# Patient Record
Sex: Female | Born: 1945 | Race: White | Hispanic: No | Smoking: Never smoker
Health system: Southern US, Community
[De-identification: ages and names within clinical notes are randomized; demographics above are authoritative.]

## PROBLEM LIST (undated history)

## (undated) DIAGNOSIS — E11319 Type 2 diabetes mellitus with unspecified diabetic retinopathy without macular edema: Secondary | ICD-10-CM

## (undated) DIAGNOSIS — E119 Type 2 diabetes mellitus without complications: Secondary | ICD-10-CM

## (undated) DIAGNOSIS — M1990 Unspecified osteoarthritis, unspecified site: Secondary | ICD-10-CM

## (undated) DIAGNOSIS — E785 Hyperlipidemia, unspecified: Secondary | ICD-10-CM

## (undated) DIAGNOSIS — E1042 Type 1 diabetes mellitus with diabetic polyneuropathy: Secondary | ICD-10-CM

## (undated) DIAGNOSIS — E669 Obesity, unspecified: Secondary | ICD-10-CM

## (undated) DIAGNOSIS — Z8619 Personal history of other infectious and parasitic diseases: Secondary | ICD-10-CM

## (undated) DIAGNOSIS — I10 Essential (primary) hypertension: Secondary | ICD-10-CM

## (undated) DIAGNOSIS — E039 Hypothyroidism, unspecified: Secondary | ICD-10-CM

## (undated) DIAGNOSIS — F32A Depression, unspecified: Secondary | ICD-10-CM

## (undated) DIAGNOSIS — F329 Major depressive disorder, single episode, unspecified: Secondary | ICD-10-CM

## (undated) DIAGNOSIS — G5603 Carpal tunnel syndrome, bilateral upper limbs: Secondary | ICD-10-CM

## (undated) DIAGNOSIS — N189 Chronic kidney disease, unspecified: Secondary | ICD-10-CM

## (undated) HISTORY — PX: EYE SURGERY: SHX253

---

## 2006-05-24 ENCOUNTER — Ambulatory Visit: Payer: Self-pay | Admitting: Family Medicine

## 2006-05-25 ENCOUNTER — Ambulatory Visit: Payer: Self-pay | Admitting: Family Medicine

## 2006-06-24 ENCOUNTER — Ambulatory Visit: Payer: Self-pay | Admitting: Family Medicine

## 2006-07-25 ENCOUNTER — Ambulatory Visit: Payer: Self-pay | Admitting: Family Medicine

## 2010-02-15 ENCOUNTER — Ambulatory Visit: Payer: Self-pay | Admitting: Family Medicine

## 2010-10-27 ENCOUNTER — Ambulatory Visit: Payer: Self-pay | Admitting: Ophthalmology

## 2010-11-09 ENCOUNTER — Ambulatory Visit: Payer: Self-pay | Admitting: Ophthalmology

## 2010-12-27 ENCOUNTER — Ambulatory Visit: Payer: Self-pay | Admitting: Ophthalmology

## 2011-01-04 ENCOUNTER — Ambulatory Visit: Payer: Self-pay | Admitting: Ophthalmology

## 2011-03-10 ENCOUNTER — Ambulatory Visit: Payer: Self-pay | Admitting: Family Medicine

## 2011-03-10 LAB — HM DEXA SCAN

## 2012-07-11 ENCOUNTER — Emergency Department: Payer: Self-pay | Admitting: Emergency Medicine

## 2012-07-11 LAB — COMPREHENSIVE METABOLIC PANEL
Albumin: 3.4 g/dL (ref 3.4–5.0)
Anion Gap: 6 — ABNORMAL LOW (ref 7–16)
BUN: 42 mg/dL — ABNORMAL HIGH (ref 7–18)
Bilirubin,Total: 0.3 mg/dL (ref 0.2–1.0)
Chloride: 114 mmol/L — ABNORMAL HIGH (ref 98–107)
Creatinine: 1.19 mg/dL (ref 0.60–1.30)
EGFR (Non-African Amer.): 48 — ABNORMAL LOW
Glucose: 75 mg/dL (ref 65–99)
Potassium: 4.4 mmol/L (ref 3.5–5.1)
SGOT(AST): 27 U/L (ref 15–37)
Sodium: 139 mmol/L (ref 136–145)
Total Protein: 7.7 g/dL (ref 6.4–8.2)

## 2012-07-11 LAB — TROPONIN I: Troponin-I: 0.03 ng/mL

## 2012-07-11 LAB — URINALYSIS, COMPLETE
Glucose,UR: 50 mg/dL (ref 0–75)
Ph: 5 (ref 4.5–8.0)
Protein: NEGATIVE
RBC,UR: 1 /HPF (ref 0–5)
Specific Gravity: 1.017 (ref 1.003–1.030)
WBC UR: 15 /HPF (ref 0–5)

## 2012-07-11 LAB — CBC
HCT: 42.3 % (ref 35.0–47.0)
HGB: 13.3 g/dL (ref 12.0–16.0)
MCH: 29.1 pg (ref 26.0–34.0)
MCHC: 31.6 g/dL — ABNORMAL LOW (ref 32.0–36.0)
MCV: 92 fL (ref 80–100)
Platelet: 189 10*3/uL (ref 150–440)
RBC: 4.58 10*6/uL (ref 3.80–5.20)

## 2012-09-18 ENCOUNTER — Ambulatory Visit: Payer: Self-pay | Admitting: Family Medicine

## 2013-01-31 ENCOUNTER — Ambulatory Visit: Payer: Self-pay | Admitting: Neurology

## 2013-07-25 HISTORY — PX: CATARACT EXTRACTION: SUR2

## 2014-01-02 ENCOUNTER — Ambulatory Visit: Payer: Self-pay | Admitting: Family Medicine

## 2014-01-03 ENCOUNTER — Ambulatory Visit: Payer: Self-pay | Admitting: Family Medicine

## 2014-01-10 ENCOUNTER — Ambulatory Visit: Payer: Self-pay | Admitting: Unknown Physician Specialty

## 2014-08-06 ENCOUNTER — Ambulatory Visit: Payer: Self-pay | Admitting: Family Medicine

## 2014-09-30 DIAGNOSIS — E1122 Type 2 diabetes mellitus with diabetic chronic kidney disease: Secondary | ICD-10-CM | POA: Insufficient documentation

## 2014-09-30 DIAGNOSIS — E1065 Type 1 diabetes mellitus with hyperglycemia: Secondary | ICD-10-CM | POA: Insufficient documentation

## 2014-10-22 LAB — HEPATIC FUNCTION PANEL
ALK PHOS: 83 U/L (ref 25–125)
ALT: 14 U/L (ref 7–35)
AST: 16 U/L (ref 13–35)

## 2014-10-22 LAB — LIPID PANEL
CHOLESTEROL: 183 mg/dL (ref 0–200)
HDL: 83 mg/dL — AB (ref 35–70)
LDL CALC: 80 mg/dL
LDl/HDL Ratio: 1
Triglycerides: 98 mg/dL (ref 40–160)

## 2014-10-22 LAB — BASIC METABOLIC PANEL
BUN: 44 mg/dL — AB (ref 4–21)
Creatinine: 1.5 mg/dL — AB (ref 0.5–1.1)
GLUCOSE: 132 mg/dL
Potassium: 4.9 mmol/L (ref 3.4–5.3)
SODIUM: 139 mmol/L (ref 137–147)

## 2014-10-22 LAB — CBC AND DIFFERENTIAL
HEMATOCRIT: 41 % (ref 36–46)
Hemoglobin: 13.3 g/dL (ref 12.0–16.0)
NEUTROS ABS: 7 /uL
PLATELETS: 206 10*3/uL (ref 150–399)
WBC: 10 10*3/mL

## 2014-10-22 LAB — TSH: TSH: 2.3 u[IU]/mL (ref 0.41–5.90)

## 2015-01-19 ENCOUNTER — Other Ambulatory Visit: Payer: Self-pay | Admitting: Family Medicine

## 2015-03-28 ENCOUNTER — Other Ambulatory Visit: Payer: Self-pay | Admitting: Family Medicine

## 2015-04-07 DIAGNOSIS — E039 Hypothyroidism, unspecified: Secondary | ICD-10-CM | POA: Insufficient documentation

## 2015-04-07 DIAGNOSIS — E11610 Type 2 diabetes mellitus with diabetic neuropathic arthropathy: Secondary | ICD-10-CM

## 2015-04-07 DIAGNOSIS — G35 Multiple sclerosis: Secondary | ICD-10-CM | POA: Insufficient documentation

## 2015-04-07 DIAGNOSIS — E669 Obesity, unspecified: Secondary | ICD-10-CM | POA: Insufficient documentation

## 2015-04-07 DIAGNOSIS — I10 Essential (primary) hypertension: Secondary | ICD-10-CM | POA: Insufficient documentation

## 2015-04-07 DIAGNOSIS — F329 Major depressive disorder, single episode, unspecified: Secondary | ICD-10-CM | POA: Insufficient documentation

## 2015-04-07 DIAGNOSIS — E785 Hyperlipidemia, unspecified: Secondary | ICD-10-CM | POA: Insufficient documentation

## 2015-04-07 DIAGNOSIS — F40240 Claustrophobia: Secondary | ICD-10-CM | POA: Insufficient documentation

## 2015-04-08 ENCOUNTER — Ambulatory Visit (INDEPENDENT_AMBULATORY_CARE_PROVIDER_SITE_OTHER): Payer: Medicare Other | Admitting: Family Medicine

## 2015-04-08 VITALS — BP 132/60 | HR 72 | Temp 98.0°F | Resp 16 | Wt 195.0 lb

## 2015-04-08 DIAGNOSIS — F328 Other depressive episodes: Secondary | ICD-10-CM

## 2015-04-08 DIAGNOSIS — E1065 Type 1 diabetes mellitus with hyperglycemia: Secondary | ICD-10-CM | POA: Diagnosis not present

## 2015-04-08 DIAGNOSIS — F3289 Other specified depressive episodes: Secondary | ICD-10-CM

## 2015-04-08 DIAGNOSIS — N183 Chronic kidney disease, stage 3 (moderate): Secondary | ICD-10-CM

## 2015-04-08 DIAGNOSIS — E039 Hypothyroidism, unspecified: Secondary | ICD-10-CM

## 2015-04-08 DIAGNOSIS — Z23 Encounter for immunization: Secondary | ICD-10-CM

## 2015-04-08 DIAGNOSIS — N1831 Chronic kidney disease, stage 3a: Secondary | ICD-10-CM

## 2015-04-08 DIAGNOSIS — N1832 Chronic kidney disease, stage 3b: Secondary | ICD-10-CM | POA: Insufficient documentation

## 2015-04-08 DIAGNOSIS — I10 Essential (primary) hypertension: Secondary | ICD-10-CM | POA: Diagnosis not present

## 2015-04-08 DIAGNOSIS — IMO0002 Reserved for concepts with insufficient information to code with codable children: Secondary | ICD-10-CM

## 2015-04-08 NOTE — Progress Notes (Signed)
Patient ID: Mary [name], female   DOB: 10/01/1945, 69 y.o.   MRN: [id number]    Subjective:  HPI   Hypertension, follow-up:  BP Readings from Last 3 Encounters:  04/08/15 132/60  10/06/14 132/64    She was last seen for hypertension 6 months ago.  BP at that visit was 132/64. Management since that visit includes none .She reports good compliance with treatment. She is not having side effects.  She is exercising. She is adherent to low salt diet.   Outside blood pressures are not being checked. She is experiencing none.  ------------------------------------------------------------------------    Lipid/Cholesterol, Follow-up:   Last seen for this 6 months ago.  Management since that visit includes none.  Last Lipid Panel:    Component Value Date/Time   CHOL 183 10/22/2014   TRIG 98 10/22/2014   HDL 83* 10/22/2014   LDLCALC 80 10/22/2014    She reports good compliance with treatment. She is not having side effects.   Wt Readings from Last 3 Encounters:  04/08/15 195 lb (88.451 kg)  10/06/14 194 lb (87.998 kg)    ------------------------------------------------------------------------  Pt reports that Dr. Janann Colonel decreased her Coreg to 12.5mg  in the AM and 12.5 in the PM because she was having a lot of fatigue. She also see Dr. Tedd Sias for her DM and she has newly diagnosed her with Type 1 Diabetes.  She reports that she was put through some test to see if she qualified for a pump and she did and the test came back that she is Type 1 Diabetic. Pt has also been diagnoses with Stage 3 chronic kidney disease since LOV. She does reports that she is doing ok emotionally with all the changes in her health.      Prior to Admission medications   Medication Sig Start Date End Date Taking? Authorizing Provider  aspirin 81 MG tablet Take 81 mg by mouth daily.   Yes Historical Provider, MD  atorvastatin (LIPITOR) 10 MG tablet Take 10 mg by mouth daily.   Yes Historical  Provider, MD  carvedilol (COREG) 25 MG tablet Take 25 mg by mouth 2 (two) times daily with a meal. Pt takes a 1/2 tab in the AM and 1.2 tab in the PM   Yes Historical Provider, MD  FLUoxetine (PROZAC) 20 MG capsule Take 20 mg by mouth daily.   Yes Historical Provider, MD  Insulin Glargine (LANTUS SOLOSTAR) 100 UNIT/ML Solostar Pen Inject 43 Units into the skin daily.   Yes Historical Provider, MD  insulin lispro (HUMALOG KWIKPEN) 100 UNIT/ML KiwkPen Inject into the skin.   Yes Historical Provider, MD  Insulin Pen Needle 31G X 8 MM MISC by Does not apply route.   Yes Historical Provider, MD  levothyroxine (SYNTHROID, LEVOTHROID) 150 MCG tablet TAKE 1 TABLET BY MOUTH EVERY DAY 01/19/15  Yes Richard Hulen Shouts., MD  quinapril (ACCUPRIL) 40 MG tablet Take 20 mg by mouth at bedtime.    Yes Historical Provider, MD  triamterene-hydrochlorothiazide (DYAZIDE) 37.5-25 MG per capsule TAKE 1 CAPSULE ONCE DAILY 03/31/15  Yes Maple Hudson., MD  Vitamin D, Ergocalciferol, (DRISDOL) 50000 UNITS CAPS capsule Take by mouth.   Yes Historical Provider, MD    Patient Active Problem List   Diagnosis Date Noted  . Chronic kidney disease (CKD) stage G3a/A1, moderately decreased glomerular filtration rate (GFR) between 45-59 mL/min/1.73 square meter and albuminuria creatinine ratio less than 30 mg/g 04/08/2015  . Hypothyroidism 04/07/2015  . Obesity 04/07/2015  .  Hyperlipidemia 04/07/2015  . Depression, major 04/07/2015  . Claustrophobia 04/07/2015  . Multiple sclerosis 04/07/2015  . Hypertension 04/07/2015  . Diabetes mellitus type 1, uncontrolled 09/30/2014    No past medical history on file.  Social History   Social History  . Marital Status: Single    Spouse Name: N/A  . Number of Children: N/A  . Years of Education: N/A   Occupational History  . Not on file.   Social History Main Topics  . Smoking status: Not on file  . Smokeless tobacco: Not on file  . Alcohol Use: Not on file  . Drug  Use: Not on file  . Sexual Activity: Not on file   Other Topics Concern  . Not on file   Social History Narrative  . No narrative on file    Allergies  Allergen Reactions  . Hydrocodone Itching    Review of Systems  Constitutional: Positive for malaise/fatigue.  HENT: Negative.   Eyes: Negative.   Respiratory: Negative.   Cardiovascular: Negative.   Gastrointestinal: Negative.   Genitourinary: Negative.   Musculoskeletal: Positive for back pain.  Skin: Negative.   Neurological: Negative.   Endo/Heme/Allergies: Negative.   Psychiatric/Behavioral: Negative.     Immunization History  Administered Date(s) Administered  . Pneumococcal Conjugate-13 06/03/2014  . Pneumococcal Polysaccharide-23 06/06/2012  . Tdap 12/06/2012  . Zoster 12/06/2012   Objective:  BP 132/60 mmHg  Pulse 72  Temp(Src) 98 F (36.7 C) (Oral)  Resp 16  Wt 195 lb (88.451 kg)  Physical Exam  Constitutional: She is oriented to person, place, and time and well-developed, well-nourished, and in no distress.  HENT:  Head: Normocephalic and atraumatic.  Right Ear: External ear normal.  Left Ear: External ear normal.  Nose: Nose normal.  Eyes: Conjunctivae are normal.  Neck: Neck supple.  Cardiovascular: Normal rate, regular rhythm and normal heart sounds.   Pulmonary/Chest: Effort normal and breath sounds normal.  Neurological: She is alert and oriented to person, place, and time.  Skin: Skin is warm and dry.  Psychiatric: Mood, memory, affect and judgment normal.    Lab Results  Component Value Date   WBC 10.0 10/22/2014   HGB 13.3 10/22/2014   HCT 41 10/22/2014   PLT 206 10/22/2014   GLUCOSE 75 07/11/2012   CHOL 183 10/22/2014   TRIG 98 10/22/2014   HDL 83* 10/22/2014   LDLCALC 80 10/22/2014   TSH 2.30 10/22/2014    CMP     Component Value Date/Time   NA 139 10/22/2014   NA 139 07/11/2012 1434   K 4.9 10/22/2014   K 4.4 07/11/2012 1434   CL 114* 07/11/2012 1434   CO2 19*  07/11/2012 1434   GLUCOSE 75 07/11/2012 1434   BUN 44* 10/22/2014   BUN 42* 07/11/2012 1434   CREATININE 1.5* 10/22/2014   CREATININE 1.19 07/11/2012 1434   CALCIUM 9.8 07/11/2012 1434   PROT 7.7 07/11/2012 1434   ALBUMIN 3.4 07/11/2012 1434   AST 16 10/22/2014   AST 27 07/11/2012 1434   ALT 14 10/22/2014   ALT 21 07/11/2012 1434   ALKPHOS 83 10/22/2014   ALKPHOS 113 07/11/2012 1434   BILITOT 0.3 07/11/2012 1434   GFRNONAA 48* 07/11/2012 1434   GFRAA 55* 07/11/2012 1434    Assessment and Plan :  1. Diabetes mellitus type 1, uncontrolled   2. Essential hypertension Check BP at home and bring in readings.  3. Hypothyroidism, unspecified hypothyroidism type   4. Other depressive episodes  5. Chronic kidney disease (CKD) stage G3a/A1, moderately decreased glomerular filtration rate (GFR) between 45-59 mL/min/1.73 square meter and albuminuria creatinine ratio less than 30 mg/g   6. Need for influenza vaccination  - Flu vaccine HIGH DOSE PF   Julieanne Manson MD [facility] 04/08/2015 1:56 PM

## 2015-04-26 ENCOUNTER — Other Ambulatory Visit: Payer: Self-pay | Admitting: Family Medicine

## 2015-06-11 ENCOUNTER — Other Ambulatory Visit: Payer: Self-pay | Admitting: Family Medicine

## 2015-06-27 ENCOUNTER — Other Ambulatory Visit: Payer: Self-pay | Admitting: Family Medicine

## 2015-07-26 HISTORY — PX: CYST EXCISION: SHX5701

## 2015-09-02 DIAGNOSIS — E113591 Type 2 diabetes mellitus with proliferative diabetic retinopathy without macular edema, right eye: Secondary | ICD-10-CM | POA: Diagnosis not present

## 2015-09-03 DIAGNOSIS — I129 Hypertensive chronic kidney disease with stage 1 through stage 4 chronic kidney disease, or unspecified chronic kidney disease: Secondary | ICD-10-CM | POA: Diagnosis not present

## 2015-09-03 DIAGNOSIS — E1122 Type 2 diabetes mellitus with diabetic chronic kidney disease: Secondary | ICD-10-CM | POA: Diagnosis not present

## 2015-09-03 DIAGNOSIS — R809 Proteinuria, unspecified: Secondary | ICD-10-CM | POA: Diagnosis not present

## 2015-09-03 DIAGNOSIS — N183 Chronic kidney disease, stage 3 (moderate): Secondary | ICD-10-CM | POA: Diagnosis not present

## 2015-09-09 ENCOUNTER — Other Ambulatory Visit: Payer: Self-pay

## 2015-09-09 MED ORDER — INSULIN GLARGINE 100 UNIT/ML SOLOSTAR PEN
43.0000 [IU] | PEN_INJECTOR | Freq: Every day | SUBCUTANEOUS | Status: DC
Start: 2015-09-09 — End: 2016-09-29

## 2015-10-02 DIAGNOSIS — E109 Type 1 diabetes mellitus without complications: Secondary | ICD-10-CM | POA: Diagnosis not present

## 2015-10-06 ENCOUNTER — Ambulatory Visit (INDEPENDENT_AMBULATORY_CARE_PROVIDER_SITE_OTHER): Payer: Medicare Other | Admitting: Family Medicine

## 2015-10-06 VITALS — BP 110/50 | HR 64 | Temp 97.7°F | Resp 16 | Ht 62.0 in | Wt 195.0 lb

## 2015-10-06 DIAGNOSIS — E785 Hyperlipidemia, unspecified: Secondary | ICD-10-CM | POA: Diagnosis not present

## 2015-10-06 DIAGNOSIS — E039 Hypothyroidism, unspecified: Secondary | ICD-10-CM

## 2015-10-06 DIAGNOSIS — I10 Essential (primary) hypertension: Secondary | ICD-10-CM | POA: Diagnosis not present

## 2015-10-06 DIAGNOSIS — Z0000 Encounter for general adult medical examination without abnormal findings: Secondary | ICD-10-CM

## 2015-10-06 DIAGNOSIS — Z1211 Encounter for screening for malignant neoplasm of colon: Secondary | ICD-10-CM

## 2015-10-06 NOTE — Progress Notes (Signed)
Patient ID: Mary [name], female   DOB: Nov 21, 1945, 70 y.o.   MRN: [id number] Patient: Mary [name], Female    DOB: [birth year], 70 y.o.   MRN: [id number] Visit Date: 10/06/2015  Today's Provider: [name], MD   Chief Complaint  Patient presents with  . Medicare Wellness   Subjective:   Mary [name] is a 70 y.o. female who presents today for her Subsequent Annual Wellness Visit. She feels fairly well. She reports exercising none. She reports she is sleeping fairly well. Patient has had rest of the patient is a movement due to her obesity and aging.her blood pressure is well controlled and she is tolerating her  medications well. She states she is stating taking them as instructed. Review of Systems  Constitutional: Negative.   HENT: Negative.   Eyes: Negative.   Respiratory: Negative.   Cardiovascular: Negative.   Gastrointestinal: Negative.   Endocrine: Negative.   Genitourinary: Negative.   Musculoskeletal: Positive for back pain and joint swelling. Negative for neck pain.  Skin: Negative.   Allergic/Immunologic: Negative.   Neurological: Negative.   Hematological: Negative.   Psychiatric/Behavioral: Negative.     Patient Active Problem List   Diagnosis Date Noted  . Chronic kidney disease (CKD) stage G3a/A1, moderately decreased glomerular filtration rate (GFR) between 45-59 mL/min/1.73 square meter and albuminuria creatinine ratio less than 30 mg/g 04/08/2015  . Hypothyroidism 04/07/2015  . Obesity 04/07/2015  . Hyperlipidemia 04/07/2015  . Depression, major (HCC) 04/07/2015  . Claustrophobia 04/07/2015  . Multiple sclerosis (HCC) 04/07/2015  . Hypertension 04/07/2015  . Diabetes mellitus type 1, uncontrolled (HCC) 09/30/2014    Social History   Social History  . Marital Status: Single    Spouse Name: N/A  . Number of Children: N/A  . Years of Education: N/A   Occupational History  . Not on file.   Social History Main Topics  . Smoking status: Not  on file  . Smokeless tobacco: Not on file  . Alcohol Use: Not on file  . Drug Use: Not on file  . Sexual Activity: Not on file   Other Topics Concern  . Not on file   Social History Narrative  . No narrative on file    Past Surgical History  Procedure Laterality Date  . Cyst excision    . Cataract extraction    . Cesarean section      Her family history includes Diabetes in her father, sister, and sister; Heart attack in her father; Hypertension in her father, sister, and sister; Lupus in her mother.    Outpatient Prescriptions Prior to Visit  Medication Sig Dispense Refill  . aspirin 81 MG tablet Take 81 mg by mouth daily.    Marland Kitchen atorvastatin (LIPITOR) 10 MG tablet TAKE 1 TABLET BY MOUTH EVERY DAY 90 tablet 1  . carvedilol (COREG) 25 MG tablet TAKE 1 TABLET BY MOUTH TWICE A DAY 180 tablet 3  . FLUoxetine (PROZAC) 20 MG capsule Take 20 mg by mouth daily.    . Insulin Glargine (LANTUS SOLOSTAR) 100 UNIT/ML Solostar Pen Inject 43 Units into the skin daily. 15 pen 8  . insulin lispro (HUMALOG KWIKPEN) 100 UNIT/ML KiwkPen Inject into the skin.    . Insulin Pen Needle 31G X 8 MM MISC by Does not apply route.    Marland Kitchen levothyroxine (SYNTHROID, LEVOTHROID) 150 MCG tablet TAKE 1 TABLET BY MOUTH EVERY DAY 90 tablet 3  . quinapril (ACCUPRIL) 40 MG tablet Take 20 mg by mouth at  bedtime.     . triamterene-hydrochlorothiazide (DYAZIDE) 37.5-25 MG per capsule TAKE 1 CAPSULE ONCE DAILY 90 capsule 3  . Vitamin D, Ergocalciferol, (DRISDOL) 50000 UNITS CAPS capsule Take by mouth.     No facility-administered medications prior to visit.    Allergies  Allergen Reactions  . Hydrocodone Itching    Patient Care Team: Maple Hudson., MD as PCP - General (Family Medicine)  Objective:   Vitals:  Filed Vitals:   10/06/15 1427  BP: 110/50  Pulse: 64  Temp: 97.7 F (36.5 C)  TempSrc: Oral  Resp: 16  Height: 5\' 2"  (1.575 m)  Weight: 195 lb (88.451 kg)    Physical Exam   Constitutional: She is oriented to person, place, and time. She appears well-developed and well-nourished.  HENT:  Head: Normocephalic and atraumatic.  Right Ear: External ear normal.  Left Ear: External ear normal.  Nose: Nose normal.  Mouth/Throat: Oropharynx is clear and moist.  Eyes: Conjunctivae and EOM are normal. Pupils are equal, round, and reactive to light.  Neck: Normal range of motion. Neck supple.  Cardiovascular: Normal rate, regular rhythm, normal heart sounds and intact distal pulses.   Pulmonary/Chest: Effort normal and breath sounds normal.  Abdominal: Soft. Bowel sounds are normal.  Musculoskeletal: Normal range of motion.  Neurological: She is alert and oriented to person, place, and time.  Skin: Skin is warm and dry.  Psychiatric: She has a normal mood and affect. Her behavior is normal. Judgment and thought content normal.    Activities of Daily Living In your present state of health, do you have any difficulty performing the following activities: 10/06/2015  Hearing? N  Vision? Y  Difficulty concentrating or making decisions? Y  Walking or climbing stairs? Y  Dressing or bathing? N  Doing errands, shopping? N    Fall Risk Assessment Fall Risk  10/06/2015  Falls in the past year? No     Depression Screen PHQ 2/9 Scores 10/06/2015  PHQ - 2 Score 0    Cognitive Testing - 6-CIT    Year: 0 4 points  Month: 0 3 points  Memorize "Floyde Parkins, [address], [address], [place]"  Time (within 1 hour:) 0 3 points  Count backwards from 20: 0 2 4 points  Name months of year: 0 2 4 points  Repeat Address: 0 2 4 6 8 10  points   Total Score: 2/28  Interpretation : Normal (0-7) Abnormal (8-28)    Assessment & Plan:     Annual Wellness Visit  Reviewed patient's Family Medical History Reviewed and updated list of patient's medical providers Assessment of cognitive impairment was done Assessed patient's functional ability Established a written schedule for health  screening services Health Risk Assessent Completed and Reviewed  Exercise Activities and Dietary recommendations Goals    None      Immunization History  Administered Date(s) Administered  . Influenza, High Dose Seasonal PF 04/08/2015  . Pneumococcal Conjugate-13 06/03/2014  . Pneumococcal Polysaccharide-23 06/06/2012  . Tdap 12/06/2012  . Zoster 12/06/2012    Health Maintenance  Topic Date Due  . HEMOGLOBIN A1C  [birth year]  . Hepatitis C Screening  1945/10/23  . FOOT EXAM  05/10/1956  . OPHTHALMOLOGY EXAM  05/10/1956  . MAMMOGRAM  05/10/1996  . COLONOSCOPY  05/10/1996  . DEXA SCAN  05/11/2011  . INFLUENZA VACCINE  02/23/2016  . TETANUS/TDAP  12/07/2022  . ZOSTAVAX  Completed  . PNA vac Low Risk Adult  Completed      Discussed  health benefits of physical activity, and encouraged her to engage in regular exercise appropriate for her age and condition.  Type 2 diabetes Low by Dr. Dalbert Mayotte. Once he was 7.5 Hypertension Hyperlipidemia Hypothyroidism Morbid obesity Topically secondary to diabetes mellitus I have done the exam and reviewed the above chart and it is accurate to the best of my knowledge.  Julieanne Manson MD [facility] 10/06/2015 2:35 PM  ------------------------------------------------------------------------------------------------------------

## 2015-10-19 DIAGNOSIS — E103511 Type 1 diabetes mellitus with proliferative diabetic retinopathy with macular edema, right eye: Secondary | ICD-10-CM | POA: Diagnosis not present

## 2015-10-23 ENCOUNTER — Other Ambulatory Visit: Payer: Self-pay | Admitting: Family Medicine

## 2015-10-23 DIAGNOSIS — Z1231 Encounter for screening mammogram for malignant neoplasm of breast: Secondary | ICD-10-CM

## 2015-10-27 DIAGNOSIS — I10 Essential (primary) hypertension: Secondary | ICD-10-CM | POA: Diagnosis not present

## 2015-10-27 DIAGNOSIS — E785 Hyperlipidemia, unspecified: Secondary | ICD-10-CM | POA: Diagnosis not present

## 2015-10-27 DIAGNOSIS — E039 Hypothyroidism, unspecified: Secondary | ICD-10-CM | POA: Diagnosis not present

## 2015-10-28 ENCOUNTER — Telehealth: Payer: Self-pay

## 2015-10-28 LAB — COMPREHENSIVE METABOLIC PANEL
A/G RATIO: 1.2 (ref 1.2–2.2)
ALK PHOS: 88 IU/L (ref 39–117)
ALT: 20 IU/L (ref 0–32)
AST: 20 IU/L (ref 0–40)
Albumin: 3.6 g/dL (ref 3.6–4.8)
BILIRUBIN TOTAL: 0.2 mg/dL (ref 0.0–1.2)
BUN/Creatinine Ratio: 27 (ref 12–28)
BUN: 35 mg/dL — ABNORMAL HIGH (ref 8–27)
CHLORIDE: 102 mmol/L (ref 96–106)
CO2: 24 mmol/L (ref 18–29)
Calcium: 9.6 mg/dL (ref 8.7–10.3)
Creatinine, Ser: 1.3 mg/dL — ABNORMAL HIGH (ref 0.57–1.00)
GFR calc Af Amer: 48 mL/min/{1.73_m2} — ABNORMAL LOW (ref >59)
GFR calc non Af Amer: 42 mL/min/{1.73_m2} — ABNORMAL LOW (ref >59)
Globulin, Total: 3.1 g/dL (ref 1.5–4.5)
Glucose: 141 mg/dL — ABNORMAL HIGH (ref 65–99)
POTASSIUM: 4.8 mmol/L (ref 3.5–5.2)
Sodium: 142 mmol/L (ref 134–144)
Total Protein: 6.7 g/dL (ref 6.0–8.5)

## 2015-10-28 LAB — CBC WITH DIFFERENTIAL/PLATELET
BASOS: 1 %
Basophils Absolute: 0 10*3/uL (ref 0.0–0.2)
EOS (ABSOLUTE): 0.2 10*3/uL (ref 0.0–0.4)
Eos: 3 %
Hematocrit: 42.4 % (ref 34.0–46.6)
Hemoglobin: 13.7 g/dL (ref 11.1–15.9)
IMMATURE GRANULOCYTES: 0 %
Immature Grans (Abs): 0 10*3/uL (ref 0.0–0.1)
Lymphocytes Absolute: 3.2 10*3/uL — ABNORMAL HIGH (ref 0.7–3.1)
Lymphs: 47 %
MCH: 30.2 pg (ref 26.6–33.0)
MCHC: 32.3 g/dL (ref 31.5–35.7)
MCV: 94 fL (ref 79–97)
MONOS ABS: 0.4 10*3/uL (ref 0.1–0.9)
Monocytes: 6 %
NEUTROS PCT: 43 %
Neutrophils Absolute: 2.8 10*3/uL (ref 1.4–7.0)
PLATELETS: 205 10*3/uL (ref 150–379)
RBC: 4.53 x10E6/uL (ref 3.77–5.28)
RDW: 13.2 % (ref 12.3–15.4)
WBC: 6.5 10*3/uL (ref 3.4–10.8)

## 2015-10-28 LAB — LIPID PANEL WITH LDL/HDL RATIO
Cholesterol, Total: 175 mg/dL (ref 100–199)
HDL: 86 mg/dL (ref >39)
LDL Calculated: 77 mg/dL (ref 0–99)
LDL/HDL RATIO: 0.9 ratio (ref 0.0–3.2)
TRIGLYCERIDES: 62 mg/dL (ref 0–149)
VLDL Cholesterol Cal: 12 mg/dL (ref 5–40)

## 2015-10-28 LAB — TSH: TSH: 0.348 u[IU]/mL — ABNORMAL LOW (ref 0.450–4.500)

## 2015-10-28 MED ORDER — LEVOTHYROXINE SODIUM 125 MCG PO TABS: 125.0000 ug | ORAL_TABLET | Freq: Every day | ORAL | Status: DC

## 2015-10-28 NOTE — Telephone Encounter (Signed)
-----   Message from Maple Hudson., MD sent at 10/28/2015 10:57 AM EDT ----- Labs okay. Thyroid overtreated. Decrease Synthroid from 150-125 g daily

## 2015-10-28 NOTE — Telephone Encounter (Signed)
Patient advised as directed below. RX for Synthroid 125 mcg sent to pharmacy.

## 2015-11-10 ENCOUNTER — Ambulatory Visit
Admission: RE | Admit: 2015-11-10 | Discharge: 2015-11-10 | Disposition: A | Discharge status: Home / Self Care | Payer: Medicare Other | Source: Ambulatory Visit | Attending: Family Medicine | Admitting: Family Medicine

## 2015-11-10 DIAGNOSIS — Z1231 Encounter for screening mammogram for malignant neoplasm of breast: Secondary | ICD-10-CM | POA: Diagnosis not present

## 2015-11-18 DIAGNOSIS — E1042 Type 1 diabetes mellitus with diabetic polyneuropathy: Secondary | ICD-10-CM | POA: Diagnosis not present

## 2015-11-25 DIAGNOSIS — N183 Chronic kidney disease, stage 3 (moderate): Secondary | ICD-10-CM | POA: Diagnosis not present

## 2015-11-25 DIAGNOSIS — E1065 Type 1 diabetes mellitus with hyperglycemia: Secondary | ICD-10-CM | POA: Diagnosis not present

## 2015-11-25 DIAGNOSIS — E1042 Type 1 diabetes mellitus with diabetic polyneuropathy: Secondary | ICD-10-CM | POA: Diagnosis not present

## 2015-11-25 DIAGNOSIS — E103313 Type 1 diabetes mellitus with moderate nonproliferative diabetic retinopathy with macular edema, bilateral: Secondary | ICD-10-CM | POA: Diagnosis not present

## 2015-11-25 DIAGNOSIS — E1022 Type 1 diabetes mellitus with diabetic chronic kidney disease: Secondary | ICD-10-CM | POA: Diagnosis not present

## 2015-11-30 DIAGNOSIS — E103511 Type 1 diabetes mellitus with proliferative diabetic retinopathy with macular edema, right eye: Secondary | ICD-10-CM | POA: Diagnosis not present

## 2015-12-01 ENCOUNTER — Other Ambulatory Visit: Payer: Self-pay | Admitting: Family Medicine

## 2015-12-25 DIAGNOSIS — E1021 Type 1 diabetes mellitus with diabetic nephropathy: Secondary | ICD-10-CM | POA: Diagnosis not present

## 2015-12-25 DIAGNOSIS — Z1211 Encounter for screening for malignant neoplasm of colon: Secondary | ICD-10-CM | POA: Diagnosis not present

## 2015-12-25 DIAGNOSIS — E669 Obesity, unspecified: Secondary | ICD-10-CM | POA: Diagnosis not present

## 2015-12-28 DIAGNOSIS — E103313 Type 1 diabetes mellitus with moderate nonproliferative diabetic retinopathy with macular edema, bilateral: Secondary | ICD-10-CM | POA: Diagnosis not present

## 2015-12-28 DIAGNOSIS — E1022 Type 1 diabetes mellitus with diabetic chronic kidney disease: Secondary | ICD-10-CM | POA: Diagnosis not present

## 2015-12-28 DIAGNOSIS — E1042 Type 1 diabetes mellitus with diabetic polyneuropathy: Secondary | ICD-10-CM | POA: Diagnosis not present

## 2015-12-28 DIAGNOSIS — N183 Chronic kidney disease, stage 3 (moderate): Secondary | ICD-10-CM | POA: Diagnosis not present

## 2015-12-28 DIAGNOSIS — E1065 Type 1 diabetes mellitus with hyperglycemia: Secondary | ICD-10-CM | POA: Diagnosis not present

## 2016-01-16 ENCOUNTER — Other Ambulatory Visit: Payer: Self-pay | Admitting: Family Medicine

## 2016-01-18 ENCOUNTER — Other Ambulatory Visit: Payer: Self-pay

## 2016-01-18 MED ORDER — FLUOXETINE HCL 20 MG PO CAPS: 20.0000 mg | ORAL_CAPSULE | Freq: Every day | ORAL | Status: DC

## 2016-01-18 NOTE — Telephone Encounter (Signed)
email from patient requesting prozac filled, please review-aa

## 2016-02-29 DIAGNOSIS — E1122 Type 2 diabetes mellitus with diabetic chronic kidney disease: Secondary | ICD-10-CM | POA: Diagnosis not present

## 2016-02-29 DIAGNOSIS — E559 Vitamin D deficiency, unspecified: Secondary | ICD-10-CM | POA: Diagnosis not present

## 2016-02-29 DIAGNOSIS — N183 Chronic kidney disease, stage 3 (moderate): Secondary | ICD-10-CM | POA: Diagnosis not present

## 2016-02-29 DIAGNOSIS — I129 Hypertensive chronic kidney disease with stage 1 through stage 4 chronic kidney disease, or unspecified chronic kidney disease: Secondary | ICD-10-CM | POA: Diagnosis not present

## 2016-03-17 ENCOUNTER — Encounter: Payer: Self-pay | Admitting: *Deleted

## 2016-03-18 ENCOUNTER — Encounter
Admission: RE | Disposition: A | Discharge status: Home / Self Care | Payer: Self-pay | Source: Ambulatory Visit | Attending: Gastroenterology

## 2016-03-18 ENCOUNTER — Encounter: Payer: Self-pay | Admitting: Anesthesiology

## 2016-03-18 ENCOUNTER — Ambulatory Visit: Payer: Medicare Other | Admitting: Anesthesiology

## 2016-03-18 ENCOUNTER — Ambulatory Visit
Admission: RE | Admit: 2016-03-18 | Discharge: 2016-03-18 | Disposition: A | Discharge status: Home / Self Care | Payer: Medicare Other | Source: Ambulatory Visit | Attending: Gastroenterology | Admitting: Gastroenterology

## 2016-03-18 DIAGNOSIS — E669 Obesity, unspecified: Secondary | ICD-10-CM | POA: Insufficient documentation

## 2016-03-18 DIAGNOSIS — Z6833 Body mass index (BMI) 33.0-33.9, adult: Secondary | ICD-10-CM | POA: Diagnosis not present

## 2016-03-18 DIAGNOSIS — Z8619 Personal history of other infectious and parasitic diseases: Secondary | ICD-10-CM | POA: Diagnosis not present

## 2016-03-18 DIAGNOSIS — Z1211 Encounter for screening for malignant neoplasm of colon: Secondary | ICD-10-CM | POA: Insufficient documentation

## 2016-03-18 DIAGNOSIS — Z7982 Long term (current) use of aspirin: Secondary | ICD-10-CM | POA: Diagnosis not present

## 2016-03-18 DIAGNOSIS — E785 Hyperlipidemia, unspecified: Secondary | ICD-10-CM | POA: Diagnosis not present

## 2016-03-18 DIAGNOSIS — Q438 Other specified congenital malformations of intestine: Secondary | ICD-10-CM | POA: Diagnosis not present

## 2016-03-18 DIAGNOSIS — Z794 Long term (current) use of insulin: Secondary | ICD-10-CM | POA: Insufficient documentation

## 2016-03-18 DIAGNOSIS — Z885 Allergy status to narcotic agent status: Secondary | ICD-10-CM | POA: Diagnosis not present

## 2016-03-18 DIAGNOSIS — K6389 Other specified diseases of intestine: Secondary | ICD-10-CM | POA: Diagnosis not present

## 2016-03-18 DIAGNOSIS — E109 Type 1 diabetes mellitus without complications: Secondary | ICD-10-CM | POA: Diagnosis not present

## 2016-03-18 DIAGNOSIS — E119 Type 2 diabetes mellitus without complications: Secondary | ICD-10-CM | POA: Diagnosis not present

## 2016-03-18 DIAGNOSIS — F329 Major depressive disorder, single episode, unspecified: Secondary | ICD-10-CM | POA: Insufficient documentation

## 2016-03-18 DIAGNOSIS — E039 Hypothyroidism, unspecified: Secondary | ICD-10-CM | POA: Diagnosis not present

## 2016-03-18 DIAGNOSIS — I10 Essential (primary) hypertension: Secondary | ICD-10-CM | POA: Insufficient documentation

## 2016-03-18 HISTORY — DX: Hypothyroidism, unspecified: E03.9

## 2016-03-18 HISTORY — DX: Hyperlipidemia, unspecified: E78.5

## 2016-03-18 HISTORY — DX: Depression, unspecified: F32.A

## 2016-03-18 HISTORY — DX: Obesity, unspecified: E66.9

## 2016-03-18 HISTORY — DX: Major depressive disorder, single episode, unspecified: F32.9

## 2016-03-18 HISTORY — DX: Personal history of other infectious and parasitic diseases: Z86.19

## 2016-03-18 HISTORY — DX: Essential (primary) hypertension: I10

## 2016-03-18 HISTORY — DX: Type 2 diabetes mellitus without complications: E11.9

## 2016-03-18 HISTORY — PX: COLONOSCOPY WITH PROPOFOL: SHX5780

## 2016-03-18 LAB — GLUCOSE, CAPILLARY: GLUCOSE-CAPILLARY: 106 mg/dL — AB (ref 65–99)

## 2016-03-18 SURGERY — COLONOSCOPY WITH PROPOFOL
Anesthesia: General

## 2016-03-18 MED ORDER — LIDOCAINE HCL (CARDIAC) 20 MG/ML IV SOLN
INTRAVENOUS | Status: DC | PRN
  Administered 2016-03-18: 40 mg via INTRAVENOUS

## 2016-03-18 MED ORDER — FENTANYL CITRATE (PF) 100 MCG/2ML IJ SOLN
INTRAMUSCULAR | Status: DC | PRN
  Administered 2016-03-18: 50 ug via INTRAVENOUS

## 2016-03-18 MED ORDER — MIDAZOLAM HCL 2 MG/2ML IJ SOLN
INTRAMUSCULAR | Status: DC | PRN
  Administered 2016-03-18: 1 mg via INTRAVENOUS

## 2016-03-18 MED ORDER — SODIUM CHLORIDE 0.9 % IV SOLN: INTRAVENOUS | Status: DC

## 2016-03-18 MED ORDER — PROPOFOL 10 MG/ML IV BOLUS
INTRAVENOUS | Status: DC | PRN
  Administered 2016-03-18 (×2): 20 mg via INTRAVENOUS
  Administered 2016-03-18: 50 mg via INTRAVENOUS

## 2016-03-18 MED ORDER — EPHEDRINE SULFATE 50 MG/ML IJ SOLN
INTRAMUSCULAR | Status: DC | PRN
  Administered 2016-03-18: 10 mg via INTRAVENOUS

## 2016-03-18 MED ORDER — PROPOFOL 500 MG/50ML IV EMUL
INTRAVENOUS | Status: DC | PRN
  Administered 2016-03-18: 140 ug/kg/min via INTRAVENOUS

## 2016-03-18 MED ORDER — PHENYLEPHRINE HCL 10 MG/ML IJ SOLN
INTRAMUSCULAR | Status: DC | PRN
  Administered 2016-03-18 (×3): 100 ug via INTRAVENOUS

## 2016-03-18 MED ORDER — SODIUM CHLORIDE 0.9 % IV SOLN
INTRAVENOUS | Status: DC
  Administered 2016-03-18: 10:00:00 via INTRAVENOUS
  Administered 2016-03-18: 1000 mL via INTRAVENOUS

## 2016-03-18 NOTE — Op Note (Signed)
[facility] Patient Name: Mary MiyamotoRhonda Moncur Procedure Date: 03/18/2016 10:12 AM MRN: [id number] Account #: [id number] Date of Birth: [birth year] Admit Type: Outpatient Age: [age] Room[facility] ROOM 1 Gender: Female Note Status: Finalized Procedure:            Colonoscopy Indications:          Screening for colorectal malignant neoplasm, This is                        the patient's first colonoscopy Providers:            Christena DeemMartin U. Skulskie, MD Referring MD:         Ferdinand Langoichard L. Sullivan LoneGilbert, MD (Referring MD) Medicines:            Monitored Anesthesia Care Complications:        No immediate complications. Procedure:            Pre-Anesthesia Assessment:                       - ASA Grade Assessment: III - A patient with severe                        systemic disease.                       After obtaining informed consent, the colonoscope was                        passed under direct vision. Throughout the procedure,                        the patient's blood pressure, pulse, and oxygen                        saturations were monitored continuously. The                        Colonoscope was introduced through the anus and                        advanced to the the cecum, identified by appendiceal                        orifice and ileocecal valve. The colonoscopy was                        unusually difficult due to significant looping and a                        tortuous colon. Successful completion of the procedure                        was aided by changing the patient to a supine position,                        changing the patient to a prone position and using                        manual pressure. The patient tolerated the procedure  well. The quality of the bowel preparation was good                        except the ascending colon was fair. Findings:      The sigmoid colon, descending colon, transverse colon and ascending     colon were significantly redundant.      The digital rectal exam was normal.      The exam was otherwise without abnormality. Impression:           - Redundant colon.                       - The examination was otherwise normal.                       - No specimens collected. Recommendation:       - Discharge patient to home.                       - Discharge patient to home.                       - Repeat colonoscopy in 10 years for screening purposes. Procedure Code(s):    --- Professional ---                       [phone], Colonoscopy, flexible; diagnostic, including                        collection of specimen(s) by brushing or washing, when                        performed (separate procedure) Diagnosis Code(s):    --- Professional ---                       Z12.11, Encounter for screening for malignant neoplasm                        of colon                       Q43.8, Other specified congenital malformations of                        intestine CPT copyright 2016 American Medical Association. All rights reserved. The codes documented in this report are preliminary and upon coder review may  be revised to meet current compliance requirements. Christena Deem, MD 03/18/2016 10:55:12 AM This report has been signed electronically. Number of Addenda: 0 Note Initiated On: 03/18/2016 10:12 AM Scope Withdrawal Time: 0 hours 8 minutes 10 seconds  Total Procedure Duration: 0 hours 31 minutes 43 seconds       [facility]

## 2016-03-18 NOTE — Anesthesia Procedure Notes (Signed)
Date/Time: 03/18/2016 10:10 AM Performed by: Henrietta Hoover Pre-anesthesia Checklist: Patient identified, Emergency Drugs available, Suction available, Patient being monitored and Timeout performed Patient Re-evaluated:Patient Re-evaluated prior to inductionOxygen Delivery Method: Nasal cannula Intubation Type: IV induction

## 2016-03-18 NOTE — H&P (Signed)
Outpatient short stay form Pre-procedure 03/18/2016 10:09 AM Mary Mary [name]  Primary Physician: Dr. Julieanne Mansonichard Gilbert  Reason for visit:  Colonoscopy  History of present illness:  Patient is a 70 year old female presenting today as above. He tolerated her prep well. She does take a daily aspirin but that has been held for a couple days. She takes no other blood thinning agent or aspirin products.    Current Facility-Administered Medications:  .  0.9 %  sodium chloride infusion, , Intravenous, Continuous, Mary Mary [name], Mary [name], Last Rate: 20 mL/hr at 03/18/16 0926, 1,000 mL at 03/18/16 0926 .  0.9 %  sodium chloride infusion, , Intravenous, Continuous, Mary DeemMartin U Dore Mary [name], Mary [name]  Prescriptions Prior to Admission  Medication Sig Dispense Refill Last Dose  . aspirin 81 MG tablet Take 81 mg by mouth daily.   Past Week at Unknown time  . atorvastatin (LIPITOR) 10 MG tablet take 1 tablet by mouth once daily 90 tablet 3 Past Week at Unknown time  . carvedilol (COREG) 25 MG tablet TAKE 1 TABLET BY MOUTH TWICE A DAY 180 tablet 3 03/18/2016 at 0545  . FLUoxetine (PROZAC) 20 MG capsule Take 1 capsule (20 mg total) by mouth daily. 90 capsule 3 Past Week at Unknown time  . Insulin Glargine (LANTUS SOLOSTAR) 100 UNIT/ML Solostar Pen Inject 43 Units into the skin daily. 15 pen 8 03/17/2016 at Unknown time  . insulin lispro (HUMALOG KWIKPEN) 100 UNIT/ML KiwkPen Inject into the skin.   03/17/2016 at Unknown time  . Insulin Pen Needle 31G X 8 MM MISC by Does not apply route.   03/17/2016 at Unknown time  . levothyroxine (SYNTHROID, LEVOTHROID) 125 MCG tablet Take 1 tablet (125 mcg total) by mouth daily. 90 tablet 3 03/17/2016 at Unknown time  . levothyroxine (SYNTHROID, LEVOTHROID) 150 MCG tablet TAKE 1 TABLET BY MOUTH EVERY DAY 90 tablet 3 03/17/2016 at Unknown time  . quinapril (ACCUPRIL) 40 MG tablet take 1 tablet by mouth once daily 90 tablet 3 03/18/2016 at 0545  . triamterene-hydrochlorothiazide (DYAZIDE)  37.5-25 MG per capsule TAKE 1 CAPSULE ONCE DAILY 90 capsule 3 03/18/2016 at 0545  . Vitamin D, Ergocalciferol, (DRISDOL) 50000 UNITS CAPS capsule Take by mouth.   Past Week at Unknown time     Allergies  Allergen Reactions  . Hydrocodone Itching     Past Medical History:  Diagnosis Date  . Depression   . Diabetes mellitus without complication (HCC)   . History of shingles   . Hyperlipidemia   . Hypertension   . Hypothyroidism   . Obesity     Review of systems:      Physical Exam    Heart and lungs: Regular rate and rhythm without rub or gallop, lungs are bilaterally clear    HEENT: Normocephalic atraumatic eyes are anicteric    Other:     Pertinant exam for procedure: Soft nontender nondistended bowel sounds positive normoactive.    Planned proceedures: Colonoscopy and indicated procedures. I have discussed the risks benefits and complications of procedures to include not limited to bleeding, infection, perforation and the risk of sedation and the patient wishes to proceed.    Mary Mary [name], [name] 03/18/2016  10:09 AM

## 2016-03-18 NOTE — Anesthesia Postprocedure Evaluation (Signed)
Anesthesia Post Note  Patient: Mary [name]  Procedure(s) Performed: Procedure(s) (LRB): COLONOSCOPY WITH PROPOFOL (N/A)  Patient location during evaluation: Endoscopy Anesthesia Type: General Level of consciousness: awake and alert Pain management: pain level controlled Vital Signs Assessment: post-procedure vital signs reviewed and stable Respiratory status: spontaneous breathing, nonlabored ventilation, respiratory function stable and patient connected to nasal cannula oxygen Cardiovascular status: blood pressure returned to baseline and stable Postop Assessment: no signs of nausea or vomiting Anesthetic complications: no    Last Vitals:  Vitals:   03/18/16 1120 03/18/16 1130  BP: (!) 118/50 (!) 108/49  Pulse: 63 62  Resp: 17 15  Temp:      Last Pain:  Vitals:   03/18/16 1059  TempSrc: Tympanic                 Lenard SimmerAndrew Aubry Tucholski

## 2016-03-18 NOTE — Anesthesia Preprocedure Evaluation (Signed)
Anesthesia Evaluation  Patient identified by MRN, date of birth, ID band Patient awake    Reviewed: Allergy & Precautions, H&P , NPO status , Patient's Chart, lab work & pertinent test results, reviewed documented beta blocker date and time   History of Anesthesia Complications Negative for: history of anesthetic complications  Airway Mallampati: I  TM Distance: >3 FB Neck ROM: full    Dental no notable dental hx. (+) Caps, Teeth Intact   Pulmonary neg pulmonary ROS,    Pulmonary exam normal breath sounds clear to auscultation       Cardiovascular Exercise Tolerance: Good hypertension, On Medications and On Home Beta Blockers (-) angina(-) CAD, (-) Past MI, (-) Cardiac Stents and (-) CABG Normal cardiovascular exam(-) dysrhythmias (-) Valvular Problems/Murmurs Rhythm:regular Rate:Normal     Neuro/Psych PSYCHIATRIC DISORDERS (Depression) negative neurological ROS     GI/Hepatic negative GI ROS, Neg liver ROS,   Endo/Other  diabetes, Type 1, Insulin DependentHypothyroidism   Renal/GU CRFRenal disease  negative genitourinary   Musculoskeletal   Abdominal   Peds  Hematology negative hematology ROS (+)   Anesthesia Other Findings Past Medical History: No date: Depression No date: Diabetes mellitus without complication (HCC) No date: History of shingles No date: Hyperlipidemia No date: Hypertension No date: Hypothyroidism No date: Obesity   Reproductive/Obstetrics negative OB ROS                             Anesthesia Physical Anesthesia Plan  ASA: III  Anesthesia Plan: General   Post-op Pain Management:    Induction:   Airway Management Planned:   Additional Equipment:   Intra-op Plan:   Post-operative Plan:   Informed Consent: I have reviewed the patients History and Physical, chart, labs and discussed the procedure including the risks, benefits and alternatives for the  proposed anesthesia with the patient or authorized representative who has indicated his/her understanding and acceptance.   Dental Advisory Given  Plan Discussed with: Anesthesiologist, CRNA and Surgeon  Anesthesia Plan Comments:         Anesthesia Quick Evaluation

## 2016-03-18 NOTE — Transfer of Care (Signed)
Immediate Anesthesia Transfer of Care Note  Patient: Mary [name]  Procedure(s) Performed: Procedure(s): COLONOSCOPY WITH PROPOFOL (N/A)  Patient Location: PACU  Anesthesia Type:General  Level of Consciousness: awake  Airway & Oxygen Therapy: Patient Spontanous Breathing and Patient connected to nasal cannula oxygen  Post-op Assessment: Report given to RN and Post -op Vital signs reviewed and stable  Post vital signs: Reviewed and stable  Last Vitals:  Vitals:   03/18/16 0912  BP: (!) 136/59  Pulse: 65  Resp: 16  Temp: (!) 35.7 C    Last Pain:  Vitals:   03/18/16 0912  TempSrc: Tympanic         Complications: No apparent anesthesia complications

## 2016-03-21 ENCOUNTER — Encounter: Payer: Self-pay | Admitting: Gastroenterology

## 2016-03-23 DIAGNOSIS — E1065 Type 1 diabetes mellitus with hyperglycemia: Secondary | ICD-10-CM | POA: Diagnosis not present

## 2016-03-30 DIAGNOSIS — E103313 Type 1 diabetes mellitus with moderate nonproliferative diabetic retinopathy with macular edema, bilateral: Secondary | ICD-10-CM | POA: Diagnosis not present

## 2016-03-30 DIAGNOSIS — N183 Chronic kidney disease, stage 3 (moderate): Secondary | ICD-10-CM | POA: Diagnosis not present

## 2016-03-30 DIAGNOSIS — E1022 Type 1 diabetes mellitus with diabetic chronic kidney disease: Secondary | ICD-10-CM | POA: Diagnosis not present

## 2016-03-30 DIAGNOSIS — E1042 Type 1 diabetes mellitus with diabetic polyneuropathy: Secondary | ICD-10-CM | POA: Diagnosis not present

## 2016-03-31 ENCOUNTER — Other Ambulatory Visit: Payer: Self-pay | Admitting: Family Medicine

## 2016-04-01 DIAGNOSIS — E103511 Type 1 diabetes mellitus with proliferative diabetic retinopathy with macular edema, right eye: Secondary | ICD-10-CM | POA: Diagnosis not present

## 2016-04-07 ENCOUNTER — Other Ambulatory Visit: Payer: Self-pay

## 2016-04-07 MED ORDER — QUINAPRIL HCL 20 MG PO TABS: 20.0000 mg | ORAL_TABLET | Freq: Every day | ORAL | 12 refills | Status: DC

## 2016-04-07 NOTE — Telephone Encounter (Signed)
Spoke with patient to verify her Quanapril dose. She takes 40 mg 1/2 tablet daily which was changed by Dr Wynelle Link a year ago originally. To make this more convenient sent in RX for Quanapril 20 mg 1 tablet daily which makes it the same. -aa

## 2016-04-11 ENCOUNTER — Telehealth: Payer: Self-pay | Admitting: Family Medicine

## 2016-04-11 NOTE — Telephone Encounter (Signed)
When I spoke to patient she said she was taking 40 mg 1/2 tablet daily, new RX sent for 1 tablet daily of 20 mg instead-aa

## 2016-04-11 NOTE — Telephone Encounter (Signed)
[facility] called saying the patient is only taking 1/2 of the 20mg  quinapril daily, not 1 tablet as directed.  Thank sTeri

## 2016-04-22 ENCOUNTER — Ambulatory Visit (INDEPENDENT_AMBULATORY_CARE_PROVIDER_SITE_OTHER): Payer: Medicare Other

## 2016-04-22 DIAGNOSIS — Z23 Encounter for immunization: Secondary | ICD-10-CM | POA: Diagnosis not present

## 2016-04-24 LAB — HM DIABETES EYE EXAM

## 2016-06-28 DIAGNOSIS — E1022 Type 1 diabetes mellitus with diabetic chronic kidney disease: Secondary | ICD-10-CM | POA: Diagnosis not present

## 2016-06-28 DIAGNOSIS — N183 Chronic kidney disease, stage 3 (moderate): Secondary | ICD-10-CM | POA: Diagnosis not present

## 2016-07-05 DIAGNOSIS — E1042 Type 1 diabetes mellitus with diabetic polyneuropathy: Secondary | ICD-10-CM | POA: Diagnosis not present

## 2016-07-05 DIAGNOSIS — E1022 Type 1 diabetes mellitus with diabetic chronic kidney disease: Secondary | ICD-10-CM | POA: Diagnosis not present

## 2016-07-05 DIAGNOSIS — N183 Chronic kidney disease, stage 3 (moderate): Secondary | ICD-10-CM | POA: Diagnosis not present

## 2016-07-05 DIAGNOSIS — E103313 Type 1 diabetes mellitus with moderate nonproliferative diabetic retinopathy with macular edema, bilateral: Secondary | ICD-10-CM | POA: Diagnosis not present

## 2016-08-26 ENCOUNTER — Other Ambulatory Visit: Payer: Self-pay | Admitting: Family Medicine

## 2016-09-05 DIAGNOSIS — R809 Proteinuria, unspecified: Secondary | ICD-10-CM | POA: Diagnosis not present

## 2016-09-05 DIAGNOSIS — I129 Hypertensive chronic kidney disease with stage 1 through stage 4 chronic kidney disease, or unspecified chronic kidney disease: Secondary | ICD-10-CM | POA: Diagnosis not present

## 2016-09-05 DIAGNOSIS — N183 Chronic kidney disease, stage 3 (moderate): Secondary | ICD-10-CM | POA: Diagnosis not present

## 2016-09-05 DIAGNOSIS — E1122 Type 2 diabetes mellitus with diabetic chronic kidney disease: Secondary | ICD-10-CM | POA: Diagnosis not present

## 2016-09-27 DIAGNOSIS — N183 Chronic kidney disease, stage 3 (moderate): Secondary | ICD-10-CM | POA: Diagnosis not present

## 2016-09-27 DIAGNOSIS — E1022 Type 1 diabetes mellitus with diabetic chronic kidney disease: Secondary | ICD-10-CM | POA: Diagnosis not present

## 2016-09-27 LAB — HEMOGLOBIN A1C: Hemoglobin A1C: 8.1

## 2016-09-27 LAB — BASIC METABOLIC PANEL
BUN: 43 mg/dL — AB (ref 4–21)
CREATININE: 1.3 mg/dL — AB (ref <=1.1)
GLUCOSE: 142 mg/dL
Potassium: 4.8 mmol/L (ref 3.4–5.3)
Sodium: 142 mmol/L (ref 137–147)

## 2016-09-29 ENCOUNTER — Other Ambulatory Visit: Payer: Self-pay | Admitting: Family Medicine

## 2016-10-06 ENCOUNTER — Encounter: Payer: Self-pay | Admitting: Family Medicine

## 2016-10-06 ENCOUNTER — Ambulatory Visit (INDEPENDENT_AMBULATORY_CARE_PROVIDER_SITE_OTHER): Payer: Medicare Other | Admitting: Family Medicine

## 2016-10-06 VITALS — BP 112/70 | HR 60 | Temp 98.0°F | Resp 14 | Ht 64.8 in | Wt 207.0 lb

## 2016-10-06 DIAGNOSIS — Z0000 Encounter for general adult medical examination without abnormal findings: Secondary | ICD-10-CM | POA: Diagnosis not present

## 2016-10-06 DIAGNOSIS — E784 Other hyperlipidemia: Secondary | ICD-10-CM

## 2016-10-06 DIAGNOSIS — Z1231 Encounter for screening mammogram for malignant neoplasm of breast: Secondary | ICD-10-CM | POA: Diagnosis not present

## 2016-10-06 DIAGNOSIS — I10 Essential (primary) hypertension: Secondary | ICD-10-CM

## 2016-10-06 DIAGNOSIS — E039 Hypothyroidism, unspecified: Secondary | ICD-10-CM

## 2016-10-06 DIAGNOSIS — Z1211 Encounter for screening for malignant neoplasm of colon: Secondary | ICD-10-CM | POA: Diagnosis not present

## 2016-10-06 DIAGNOSIS — E559 Vitamin D deficiency, unspecified: Secondary | ICD-10-CM | POA: Diagnosis not present

## 2016-10-06 DIAGNOSIS — E7849 Other hyperlipidemia: Secondary | ICD-10-CM

## 2016-10-06 DIAGNOSIS — Z1239 Encounter for other screening for malignant neoplasm of breast: Secondary | ICD-10-CM

## 2016-10-06 NOTE — Progress Notes (Signed)
Patient: Mary [name], Female    DOB: 10-15-1945, 71 y.o.   MRN: [id number] Visit Date: 10/06/2016  Today's Provider: [name], MD   Chief Complaint  Patient presents with  . Medicare Wellness   Subjective:   Mary [name] is a 71 y.o. female who presents today for her Subsequent Annual Wellness Visit. She feels fairly well. She reports exercising walking 2 to 3 times a week for about 20 to 30 minutes. She reports she is sleeping well. Immunization History  Administered Date(s) Administered  . Influenza, High Dose Seasonal PF 04/08/2015, 04/22/2016  . Pneumococcal Conjugate-13 06/03/2014  . Pneumococcal Polysaccharide-23 06/06/2012  . Tdap 12/06/2012  . Zoster 12/06/2012   Last pap smear 05/04/14 normal, never had hysterectomy  Mammogram 11/10/15 normal-patient knows to have  one scheduled.  Colonoscopy 03/18/16 repeat in 10 year-2025.  Review of Systems  Constitutional: Positive for fatigue.  HENT: Negative.   Eyes: Negative.   Respiratory: Negative.   Cardiovascular: Negative.   Gastrointestinal: Negative.   Endocrine: Negative.   Genitourinary: Negative.   Musculoskeletal: Positive for back pain.  Skin: Negative.   Allergic/Immunologic: Negative.   Neurological: Negative.   Hematological: Negative.   Psychiatric/Behavioral: Negative.     Patient Active Problem List   Diagnosis Date Noted  . Chronic kidney disease (CKD) stage G3a/A1, moderately decreased glomerular filtration rate (GFR) between 45-59 mL/min/1.73 square meter and albuminuria creatinine ratio less than 30 mg/g 04/08/2015  . Hypothyroidism 04/07/2015  . Obesity 04/07/2015  . Hyperlipidemia 04/07/2015  . Depression, major 04/07/2015  . Claustrophobia 04/07/2015  . Multiple sclerosis (HCC) 04/07/2015  . Hypertension 04/07/2015  . Diabetes mellitus type 1, uncontrolled (HCC) 09/30/2014    Social History   Social History  . Marital status: Divorced    Spouse name: N/A  . Number of  children: N/A  . Years of education: N/A   Occupational History  . Not on file.   Social History Main Topics  . Smoking status: Never Smoker  . Smokeless tobacco: Never Used  . Alcohol use No  . Drug use: No  . Sexual activity: No   Other Topics Concern  . Not on file   Social History Narrative  . No narrative on file    Past Surgical History:  Procedure Laterality Date  . CATARACT EXTRACTION    . CESAREAN SECTION    . COLONOSCOPY WITH PROPOFOL N/A 03/18/2016   redundant colon, otherwise normal repeat 10 years. Surgeon: Christena Deem, MD;  Location: [facility] ENDOSCOPY;  . CYST EXCISION    . EYE SURGERY      Her family history includes Diabetes in her father, sister, and sister; Heart attack in her father; Hypertension in her father, sister, and sister; Lupus in her mother.     Outpatient Encounter Prescriptions as of 10/06/2016  Medication Sig Note  . aspirin 81 MG tablet Take 81 mg by mouth daily.   Marland Kitchen atorvastatin (LIPITOR) 10 MG tablet take 1 tablet by mouth once daily   . carvedilol (COREG) 25 MG tablet TAKE 1 TABLET BY MOUTH TWICE A DAY   . FLUoxetine (PROZAC) 20 MG capsule Take 1 capsule (20 mg total) by mouth daily.   . insulin lispro (HUMALOG KWIKPEN) 100 UNIT/ML KiwkPen Inject into the skin.   . Insulin Pen Needle 31G X 8 MM MISC by Does not apply route.   Marland Kitchen LANTUS SOLOSTAR 100 UNIT/ML Solostar Pen INJECT 43 UNITS INTO THE SKIN DAILY   . levothyroxine (SYNTHROID, LEVOTHROID)  125 MCG tablet take 1 tablet by mouth once daily   . quinapril (ACCUPRIL) 20 MG tablet Take 1 tablet (20 mg total) by mouth daily.   Marland Kitchen triamterene-hydrochlorothiazide (DYAZIDE) 37.5-25 MG capsule take 1 capsule by mouth once daily   . Vitamin D, Ergocalciferol, (DRISDOL) 50000 UNITS CAPS capsule Take 50,000 Units by mouth every 30 (thirty) days.  04/08/2015: Received from: [facility]  . [DISCONTINUED] levothyroxine (SYNTHROID, LEVOTHROID) 150 MCG tablet TAKE 1 TABLET BY MOUTH  EVERY DAY    No facility-administered encounter medications on file as of 10/06/2016.     Allergies  Allergen Reactions  . Hydrocodone Itching    Patient Care Team: Maple Hudson., MD as PCP - General (Family Medicine)   Objective:   Vitals:  Vitals:   10/06/16 1455  BP: 112/70  Pulse: 60  Resp: 14  Temp: 98 F (36.7 C)  Weight: 207 lb (93.9 kg)  Height: 5' 4.8" (1.646 m)    Physical Exam  Constitutional: She is oriented to person, place, and time. She appears well-developed and well-nourished.  HENT:  Head: Normocephalic and atraumatic.  Right Ear: External ear normal.  Left Ear: External ear normal.  Eyes: Conjunctivae are normal. Pupils are equal, round, and reactive to light.  Neck: Normal range of motion. Neck supple.  Cardiovascular: Normal rate, regular rhythm, normal heart sounds and intact distal pulses.   No murmur heard. Pulmonary/Chest: Effort normal and breath sounds normal. No respiratory distress. She has no wheezes. Right breast exhibits no inverted nipple and no mass. Left breast exhibits no inverted nipple and no mass.  Abdominal: Soft. She exhibits no distension. There is no tenderness.  Genitourinary: Uterus normal. No vaginal discharge found.  Genitourinary Comments: Defer DRE and pap smear, bimanual done today-normal.  Musculoskeletal: She exhibits no edema or tenderness.  Neurological: She is alert and oriented to person, place, and time.  Skin: No rash noted. No erythema.    Activities of Daily Living In your present state of health, do you have any difficulty performing the following activities: 10/06/2016  Hearing? N  Vision? N  Difficulty concentrating or making decisions? N  Walking or climbing stairs? Y  Dressing or bathing? N  Doing errands, shopping? N  Some recent data might be hidden    Fall Risk Assessment Fall Risk  10/06/2016 10/06/2015  Falls in the past year? No No     Depression Screen PHQ 2/9 Scores 10/06/2016  10/06/2015  PHQ - 2 Score 0 0  PHQ- 9 Score 2 -    Cognitive Testing - 6-CIT    Year: 0 4 points  Month: 0 3 points  Memorize "Floyde Parkins, [address], [address], [place]"  Time (within 1 hour:) 0 3 points  Count backwards from 20: 0 2 4 points  Name months of year: 0 2 4 points  Repeat Address: 0 2 4 6 8 10  points   Total Score: 2/28  Interpretation : Normal (0-7) Abnormal (8-28)    Assessment & Plan:     Annual Wellness Visit  Reviewed patient's Family Medical History Reviewed and updated list of patient's medical providers Assessment of cognitive impairment was done Assessed patient's functional ability Established a written schedule for health screening services Health Risk Assessent Completed and Reviewed 1. Medicare annual wellness visit, subsequent Pap smear in 2019 or 2020.  Sees endocrinologist for diabetes.  2. Colon cancer screening  3. Breast cancer screening - MM Digital Screening; Future  4. Essential hypertension -  Hepatic function panel - CBC w/Diff/Platelet  5. Hypothyroidism, unspecified type - TSH  6. Other hyperlipidemia - Hepatic function panel - Lipid Panel With LDL/HDL Ratio  7. Vitamin D deficiency - Vitamin D (25 hydroxy) 8.TIIDM Dr Elder Love 6.5 last week. HPI, Exam and A&P transcribed under direction and in the presence of Julieanne Manson, MD.   Discussed health benefits of physical activity, and encouraged her to engage in regular exercise appropriate for her age and condition.  I have done the exam and reviewed the chart and it is accurate to the best of my knowledge. Dentist has been used and  any errors in dictation or transcription are unintentional. Julieanne Manson M.D. [facility]

## 2016-10-06 NOTE — Progress Notes (Deleted)
Patient: Mary [name], Female    DOB: Nov 18, 1945, 71 y.o.   MRN: [id number] Visit Date: 10/06/2016  Today's Provider: [name], MD   Chief Complaint  Patient presents with  . Medicare Wellness   Subjective:   RAIDEN YEARWOOD is a 71 y.o. female who presents today for her Subsequent Annual Wellness Visit. She feels well. She reports exercising 2-3 times pwer week. She reports she is sleeping well.  Immunization History  Administered Date(s) Administered  . Influenza, High Dose Seasonal PF 04/08/2015, 04/22/2016  . Pneumococcal Conjugate-13 06/03/2014  . Pneumococcal Polysaccharide-23 06/06/2012  . Tdap 12/06/2012  . Zoster 12/06/2012    05/04/14 Pap 11/10/15 Mammogram-negative 03/18/16 Colonoscopy-normal   Review of Systems  Constitutional: Positive for fatigue.  HENT: Negative.   Eyes: Negative.   Respiratory: Negative.   Cardiovascular: Negative.   Gastrointestinal: Negative.   Endocrine: Negative.   Genitourinary: Negative.   Musculoskeletal: Positive for back pain.  Skin: Negative.   Allergic/Immunologic: Negative.   Neurological: Negative.   Hematological: Negative.   Psychiatric/Behavioral: Negative.     Patient Active Problem List   Diagnosis Date Noted  . Chronic kidney disease (CKD) stage G3a/A1, moderately decreased glomerular filtration rate (GFR) between 45-59 mL/min/1.73 square meter and albuminuria creatinine ratio less than 30 mg/g 04/08/2015  . Hypothyroidism 04/07/2015  . Obesity 04/07/2015  . Hyperlipidemia 04/07/2015  . Depression, major 04/07/2015  . Claustrophobia 04/07/2015  . Multiple sclerosis (HCC) 04/07/2015  . Hypertension 04/07/2015  . Diabetes mellitus type 1, uncontrolled (HCC) 09/30/2014    Social History   Social History  . Marital status: Divorced    Spouse name: N/A  . Number of children: N/A  . Years of education: N/A   Occupational History  . Not on file.   Social History Main Topics  . Smoking status:  Never Smoker  . Smokeless tobacco: Never Used  . Alcohol use No  . Drug use: No  . Sexual activity: No   Other Topics Concern  . Not on file   Social History Narrative  . No narrative on file    Past Surgical History:  Procedure Laterality Date  . CATARACT EXTRACTION    . CESAREAN SECTION    . COLONOSCOPY WITH PROPOFOL N/A 03/18/2016   redundant colon, otherwise normal repeat 10 years. Surgeon: Christena Deem, MD;  Location: [facility] ENDOSCOPY;  . CYST EXCISION    . EYE SURGERY      Her family history includes Diabetes in her father, sister, and sister; Heart attack in her father; Hypertension in her father, sister, and sister; Lupus in her mother.     Outpatient Encounter Prescriptions as of 10/06/2016  Medication Sig Note  . aspirin 81 MG tablet Take 81 mg by mouth daily.   Marland Kitchen atorvastatin (LIPITOR) 10 MG tablet take 1 tablet by mouth once daily   . carvedilol (COREG) 25 MG tablet TAKE 1 TABLET BY MOUTH TWICE A DAY   . FLUoxetine (PROZAC) 20 MG capsule Take 1 capsule (20 mg total) by mouth daily.   . insulin lispro (HUMALOG KWIKPEN) 100 UNIT/ML KiwkPen Inject into the skin.   . Insulin Pen Needle 31G X 8 MM MISC by Does not apply route.   Marland Kitchen LANTUS SOLOSTAR 100 UNIT/ML Solostar Pen INJECT 43 UNITS INTO THE SKIN DAILY   . levothyroxine (SYNTHROID, LEVOTHROID) 125 MCG tablet take 1 tablet by mouth once daily   . quinapril (ACCUPRIL) 20 MG tablet Take 1 tablet (20 mg total) by mouth  daily.   . triamterene-hydrochlorothiazide (DYAZIDE) 37.5-25 MG capsule take 1 capsule by mouth once daily   . Vitamin D, Ergocalciferol, (DRISDOL) 50000 UNITS CAPS capsule Take 50,000 Units by mouth every 30 (thirty) days.  04/08/2015: Received from: [facility]  . [DISCONTINUED] levothyroxine (SYNTHROID, LEVOTHROID) 150 MCG tablet TAKE 1 TABLET BY MOUTH EVERY DAY    No facility-administered encounter medications on file as of 10/06/2016.     Allergies  Allergen Reactions  .  Hydrocodone Itching    Patient Care Team: Maple Hudson., MD as PCP - General (Family Medicine)   Objective:   Vitals:  Vitals:   10/06/16 1455  BP: 112/70  Pulse: 60  Resp: 14  Temp: 98 F (36.7 C)  Weight: 207 lb (93.9 kg)  Height: 5' 4.8" (1.646 m)    Physical Exam  Constitutional: She is oriented to person, place, and time. She appears well-developed and well-nourished.  HENT:  Head: Normocephalic and atraumatic.  Right Ear: External ear normal.  Left Ear: External ear normal.  Nose: Nose normal.  Mouth/Throat: Oropharynx is clear and moist.  Eyes: Conjunctivae and EOM are normal. Pupils are equal, round, and reactive to light.  Neck: Normal range of motion. Neck supple.  Cardiovascular: Normal rate, regular rhythm, normal heart sounds and intact distal pulses.   Pulmonary/Chest: Effort normal and breath sounds normal.  Abdominal: Soft. Bowel sounds are normal.  Musculoskeletal: Normal range of motion.  Neurological: She is alert and oriented to person, place, and time.  Skin: Skin is warm and dry.  Psychiatric: She has a normal mood and affect. Her behavior is normal. Judgment and thought content normal.    Activities of Daily Living In your present state of health, do you have any difficulty performing the following activities: 10/06/2016  Hearing? N  Vision? N  Difficulty concentrating or making decisions? N  Walking or climbing stairs? Y  Dressing or bathing? N  Doing errands, shopping? N  Some recent data might be hidden    Fall Risk Assessment Fall Risk  10/06/2016 10/06/2015  Falls in the past year? No No     Depression Screen PHQ 2/9 Scores 10/06/2016 10/06/2015  PHQ - 2 Score 0 0  PHQ- 9 Score 2 -    Cognitive Testing - 6-CIT    Year: 0 4 points  Month: 0 3 points  Memorize "Floyde Parkins, [address], [address], [place]"  Time (within 1 hour:) 0 3 points  Count backwards from 20: 0 2 4 points  Name months of year: 0 2 4 points  Repeat Address: 0  2 4 6 8 10  points   Total Score: 2/28  Interpretation : Normal (0-7) Abnormal (8-28)    Assessment & Plan:     Annual Wellness Visit  Reviewed patient's Family Medical History Reviewed and updated list of patient's medical providers Assessment of cognitive impairment was done Assessed patient's functional ability Established a written schedule for health screening services Health Risk Assessent Completed and Reviewed  Exercise Activities and Dietary recommendations Goals    None      Immunization History  Administered Date(s) Administered  . Influenza, High Dose Seasonal PF 04/08/2015, 04/22/2016  . Pneumococcal Conjugate-13 06/03/2014  . Pneumococcal Polysaccharide-23 06/06/2012  . Tdap 12/06/2012  . Zoster 12/06/2012    Health Maintenance  Topic Date Due  . HEMOGLOBIN A1C  [birth year]  . Hepatitis C Screening  [birth year]  . FOOT EXAM  05/10/1956  . OPHTHALMOLOGY EXAM  05/10/1956  .  DEXA SCAN  05/11/2011  . MAMMOGRAM  11/09/2017  . TETANUS/TDAP  12/07/2022  . COLONOSCOPY  03/18/2026  . INFLUENZA VACCINE  Completed  . PNA vac Low Risk Adult  Completed     Discussed health benefits of physical activity, and encouraged her to engage in regular exercise appropriate for her age and condition.

## 2016-10-07 ENCOUNTER — Other Ambulatory Visit: Payer: Self-pay

## 2016-10-07 MED ORDER — CARVEDILOL 25 MG PO TABS: 25.0000 mg | ORAL_TABLET | Freq: Two times a day (BID) | ORAL | 3 refills | Status: DC

## 2016-10-28 DIAGNOSIS — E103313 Type 1 diabetes mellitus with moderate nonproliferative diabetic retinopathy with macular edema, bilateral: Secondary | ICD-10-CM | POA: Diagnosis not present

## 2016-10-28 DIAGNOSIS — N183 Chronic kidney disease, stage 3 (moderate): Secondary | ICD-10-CM | POA: Diagnosis not present

## 2016-10-28 DIAGNOSIS — E1042 Type 1 diabetes mellitus with diabetic polyneuropathy: Secondary | ICD-10-CM | POA: Diagnosis not present

## 2016-10-28 DIAGNOSIS — E1022 Type 1 diabetes mellitus with diabetic chronic kidney disease: Secondary | ICD-10-CM | POA: Diagnosis not present

## 2016-12-05 DIAGNOSIS — I10 Essential (primary) hypertension: Secondary | ICD-10-CM | POA: Diagnosis not present

## 2016-12-05 DIAGNOSIS — E039 Hypothyroidism, unspecified: Secondary | ICD-10-CM | POA: Diagnosis not present

## 2016-12-05 DIAGNOSIS — E784 Other hyperlipidemia: Secondary | ICD-10-CM | POA: Diagnosis not present

## 2016-12-05 DIAGNOSIS — E559 Vitamin D deficiency, unspecified: Secondary | ICD-10-CM | POA: Diagnosis not present

## 2016-12-06 ENCOUNTER — Telehealth: Payer: Self-pay

## 2016-12-06 LAB — CBC WITH DIFFERENTIAL/PLATELET
Basophils Absolute: 0 10*3/uL (ref 0.0–0.2)
Basos: 1 %
EOS (ABSOLUTE): 0.2 10*3/uL (ref 0.0–0.4)
EOS: 3 %
HEMATOCRIT: 40 % (ref 34.0–46.6)
Hemoglobin: 12.9 g/dL (ref 11.1–15.9)
Immature Grans (Abs): 0 10*3/uL (ref 0.0–0.1)
Immature Granulocytes: 0 %
LYMPHS ABS: 2.6 10*3/uL (ref 0.7–3.1)
Lymphs: 40 %
MCH: 30.2 pg (ref 26.6–33.0)
MCHC: 32.3 g/dL (ref 31.5–35.7)
MCV: 94 fL (ref 79–97)
MONOS ABS: 0.6 10*3/uL (ref 0.1–0.9)
Monocytes: 9 %
Neutrophils Absolute: 3 10*3/uL (ref 1.4–7.0)
Neutrophils: 47 %
Platelets: 200 10*3/uL (ref 150–379)
RBC: 4.27 x10E6/uL (ref 3.77–5.28)
RDW: 13.3 % (ref 12.3–15.4)
WBC: 6.4 10*3/uL (ref 3.4–10.8)

## 2016-12-06 LAB — LIPID PANEL WITH LDL/HDL RATIO
Cholesterol, Total: 198 mg/dL (ref 100–199)
HDL: 75 mg/dL (ref >39)
LDL Calculated: 109 mg/dL — ABNORMAL HIGH (ref 0–99)
LDL/HDL RATIO: 1.5 ratio (ref 0.0–3.2)
TRIGLYCERIDES: 69 mg/dL (ref 0–149)
VLDL Cholesterol Cal: 14 mg/dL (ref 5–40)

## 2016-12-06 LAB — TSH: TSH: 0.848 u[IU]/mL (ref 0.450–4.500)

## 2016-12-06 LAB — HEPATIC FUNCTION PANEL
ALK PHOS: 80 IU/L (ref 39–117)
ALT: 10 IU/L (ref 0–32)
AST: 15 IU/L (ref 0–40)
Albumin: 3.5 g/dL (ref 3.5–4.8)
BILIRUBIN, DIRECT: 0.08 mg/dL (ref 0.00–0.40)
Bilirubin Total: 0.3 mg/dL (ref 0.0–1.2)
TOTAL PROTEIN: 6.6 g/dL (ref 6.0–8.5)

## 2016-12-06 LAB — VITAMIN D 25 HYDROXY (VIT D DEFICIENCY, FRACTURES): VIT D 25 HYDROXY: 19 ng/mL — AB (ref 30.0–100.0)

## 2016-12-06 MED ORDER — LEVOTHYROXINE SODIUM 125 MCG PO TABS: 125.0000 ug | ORAL_TABLET | Freq: Every day | ORAL | 3 refills | Status: DC

## 2016-12-06 MED ORDER — QUINAPRIL HCL 20 MG PO TABS: 20.0000 mg | ORAL_TABLET | Freq: Every day | ORAL | 3 refills | Status: DC

## 2016-12-06 MED ORDER — VITAMIN D (ERGOCALCIFEROL) 1.25 MG (50000 UNIT) PO CAPS: 50000.0000 [IU] | ORAL_CAPSULE | ORAL | 3 refills | Status: DC

## 2016-12-06 NOTE — Telephone Encounter (Signed)
-----   Message from Maple Hudson., MD sent at 12/06/2016 10:15 AM EDT ----- Labs ok. Increase prescription vitamin D to weekly. Recheck 4-6 months

## 2016-12-06 NOTE — Telephone Encounter (Signed)
Patient advised as below. Patient verbalizes understanding and is in agreement with treatment plan.  

## 2016-12-06 NOTE — Addendum Note (Signed)
Addended by: Miachel Roux on: 12/06/2016 10:53 AM   Modules accepted: Orders

## 2016-12-09 DIAGNOSIS — E103511 Type 1 diabetes mellitus with proliferative diabetic retinopathy with macular edema, right eye: Secondary | ICD-10-CM | POA: Diagnosis not present

## 2016-12-09 LAB — HM DIABETES EYE EXAM

## 2017-01-06 ENCOUNTER — Other Ambulatory Visit: Payer: Self-pay | Admitting: Family Medicine

## 2017-01-06 MED ORDER — TRIAMTERENE-HCTZ 37.5-25 MG PO CAPS: 1.0000 | ORAL_CAPSULE | Freq: Every day | ORAL | 3 refills | Status: DC

## 2017-01-06 NOTE — Telephone Encounter (Signed)
Pt contacted office for refill request on the following medications:   [facility] [address]  VQ#[phone]/EP  triamterene-hydrochlorothiazide (DYAZIDE) 37.5-25 MG capsule  FLUoxetine (PROZAC) 20 MG capsule

## 2017-01-11 ENCOUNTER — Other Ambulatory Visit: Payer: Self-pay

## 2017-01-11 ENCOUNTER — Ambulatory Visit
Admission: RE | Admit: 2017-01-11 | Discharge: 2017-01-11 | Disposition: A | Discharge status: Home / Self Care | Payer: Medicare Other | Source: Ambulatory Visit | Attending: Family Medicine | Admitting: Family Medicine

## 2017-01-11 DIAGNOSIS — Z1231 Encounter for screening mammogram for malignant neoplasm of breast: Secondary | ICD-10-CM | POA: Insufficient documentation

## 2017-01-11 DIAGNOSIS — Z1239 Encounter for other screening for malignant neoplasm of breast: Secondary | ICD-10-CM

## 2017-01-11 MED ORDER — FLUOXETINE HCL 20 MG PO CAPS: 20.0000 mg | ORAL_CAPSULE | Freq: Every day | ORAL | 3 refills | Status: DC

## 2017-01-12 ENCOUNTER — Other Ambulatory Visit: Payer: Self-pay | Admitting: Family Medicine

## 2017-01-20 DIAGNOSIS — E103511 Type 1 diabetes mellitus with proliferative diabetic retinopathy with macular edema, right eye: Secondary | ICD-10-CM | POA: Diagnosis not present

## 2017-01-20 HISTORY — PX: EYE SURGERY: SHX253

## 2017-01-30 DIAGNOSIS — N183 Chronic kidney disease, stage 3 (moderate): Secondary | ICD-10-CM | POA: Diagnosis not present

## 2017-01-30 DIAGNOSIS — E1022 Type 1 diabetes mellitus with diabetic chronic kidney disease: Secondary | ICD-10-CM | POA: Diagnosis not present

## 2017-02-06 DIAGNOSIS — N183 Chronic kidney disease, stage 3 (moderate): Secondary | ICD-10-CM | POA: Diagnosis not present

## 2017-02-06 DIAGNOSIS — E103313 Type 1 diabetes mellitus with moderate nonproliferative diabetic retinopathy with macular edema, bilateral: Secondary | ICD-10-CM | POA: Diagnosis not present

## 2017-02-06 DIAGNOSIS — E1042 Type 1 diabetes mellitus with diabetic polyneuropathy: Secondary | ICD-10-CM | POA: Diagnosis not present

## 2017-02-06 DIAGNOSIS — E1022 Type 1 diabetes mellitus with diabetic chronic kidney disease: Secondary | ICD-10-CM | POA: Diagnosis not present

## 2017-02-28 DIAGNOSIS — R809 Proteinuria, unspecified: Secondary | ICD-10-CM | POA: Diagnosis not present

## 2017-02-28 DIAGNOSIS — I129 Hypertensive chronic kidney disease with stage 1 through stage 4 chronic kidney disease, or unspecified chronic kidney disease: Secondary | ICD-10-CM | POA: Diagnosis not present

## 2017-02-28 DIAGNOSIS — E1122 Type 2 diabetes mellitus with diabetic chronic kidney disease: Secondary | ICD-10-CM | POA: Diagnosis not present

## 2017-02-28 DIAGNOSIS — N183 Chronic kidney disease, stage 3 (moderate): Secondary | ICD-10-CM | POA: Diagnosis not present

## 2017-03-07 DIAGNOSIS — N183 Chronic kidney disease, stage 3 (moderate): Secondary | ICD-10-CM | POA: Diagnosis not present

## 2017-03-07 DIAGNOSIS — I10 Essential (primary) hypertension: Secondary | ICD-10-CM | POA: Diagnosis not present

## 2017-03-07 DIAGNOSIS — E1122 Type 2 diabetes mellitus with diabetic chronic kidney disease: Secondary | ICD-10-CM | POA: Diagnosis not present

## 2017-03-07 DIAGNOSIS — N2581 Secondary hyperparathyroidism of renal origin: Secondary | ICD-10-CM | POA: Diagnosis not present

## 2017-03-10 ENCOUNTER — Other Ambulatory Visit: Payer: Self-pay | Admitting: Family Medicine

## 2017-03-10 MED ORDER — ATORVASTATIN CALCIUM 10 MG PO TABS: 10.0000 mg | ORAL_TABLET | Freq: Every day | ORAL | 3 refills | Status: DC

## 2017-03-10 NOTE — Telephone Encounter (Signed)
[facility] faxed a refill request on the following medications:  atorvastatin (LIPITOR) 10 MG tablet.  Take 1 tablet by mouth once daily.  90 day supply.  [facility] [address]/[place]

## 2017-05-15 DIAGNOSIS — N183 Chronic kidney disease, stage 3 (moderate): Secondary | ICD-10-CM | POA: Diagnosis not present

## 2017-05-15 DIAGNOSIS — E1022 Type 1 diabetes mellitus with diabetic chronic kidney disease: Secondary | ICD-10-CM | POA: Diagnosis not present

## 2017-05-23 ENCOUNTER — Ambulatory Visit (INDEPENDENT_AMBULATORY_CARE_PROVIDER_SITE_OTHER): Payer: Medicare Other | Admitting: Family Medicine

## 2017-05-23 ENCOUNTER — Encounter: Payer: Self-pay | Admitting: Family Medicine

## 2017-05-23 VITALS — BP 104/60 | HR 72 | Temp 97.6°F | Wt 219.6 lb

## 2017-05-23 DIAGNOSIS — N183 Chronic kidney disease, stage 3 unspecified: Secondary | ICD-10-CM

## 2017-05-23 DIAGNOSIS — R05 Cough: Secondary | ICD-10-CM | POA: Diagnosis not present

## 2017-05-23 DIAGNOSIS — E1022 Type 1 diabetes mellitus with diabetic chronic kidney disease: Secondary | ICD-10-CM

## 2017-05-23 DIAGNOSIS — J029 Acute pharyngitis, unspecified: Secondary | ICD-10-CM | POA: Diagnosis not present

## 2017-05-23 DIAGNOSIS — R509 Fever, unspecified: Secondary | ICD-10-CM | POA: Diagnosis not present

## 2017-05-23 DIAGNOSIS — R059 Cough, unspecified: Secondary | ICD-10-CM

## 2017-05-23 LAB — POCT INFLUENZA A/B
INFLUENZA B, POC: NEGATIVE
Influenza A, POC: NEGATIVE

## 2017-05-23 MED ORDER — LORATADINE 10 MG PO TABS: 10.0000 mg | ORAL_TABLET | Freq: Every day | ORAL | 0 refills | Status: DC

## 2017-05-23 MED ORDER — DM-GUAIFENESIN ER 30-600 MG PO TB12
1.0000 | ORAL_TABLET | Freq: Two times a day (BID) | ORAL | 0 refills | Status: DC
Start: 2017-05-23 — End: 2018-09-05

## 2017-05-23 NOTE — Progress Notes (Signed)
Patient: Mary [name] Female    DOB: 1946-07-11   71 y.o.   MRN: [id number] Visit Date: 05/23/2017  Today's Provider: Dortha Kernennis Jaquelinne Glendening, PA   Chief Complaint  Patient presents with  . URI   Subjective:    URI   This is a new problem. Episode onset: 4 days ago. The problem has been unchanged. The maximum temperature recorded prior to her arrival was 100.4 - 100.9 F. Associated symptoms include congestion, coughing and a sore throat. Associated symptoms comments: Fever and chills. She has tried acetaminophen for the symptoms.   Past Medical History:  Diagnosis Date  . Depression   . Diabetes mellitus without complication (HCC)   . History of shingles   . Hyperlipidemia   . Hypertension   . Hypothyroidism   . Obesity    Past Surgical History:  Procedure Laterality Date  . CATARACT EXTRACTION    . CESAREAN SECTION    . COLONOSCOPY WITH PROPOFOL N/A 03/18/2016   redundant colon, otherwise normal repeat 10 years. Surgeon: Christena DeemMartin U Skulskie, MD;  [facility] END[facility];  . CYST EXCISION    . EYE SURGERY     Family History  Problem Relation Age of Onset  . Lupus Mother   . Hypertension Father   . Diabetes Father   . Heart attack Father   . Diabetes Sister   . Diabetes Sister   . Hypertension Sister   . Hypertension Sister   . Breast cancer Neg Hx    Allergies  Allergen Reactions  . Hydrocodone Itching     Previous Medications   ASPIRIN 81 MG TABLET    Take 81 mg by mouth daily.   ATORVASTATIN (LIPITOR) 10 MG TABLET    Take 1 tablet (10 mg total) by mouth daily.   CARVEDILOL (COREG) 25 MG TABLET    Take 1 tablet (25 mg total) by mouth 2 (two) times daily.   FLUOXETINE (PROZAC) 20 MG CAPSULE    Take 1 capsule (20 mg total) by mouth daily.   INSULIN LISPRO (HUMALOG KWIKPEN) 100 UNIT/ML KIWKPEN    Inject into the skin.   INSULIN PEN NEEDLE 31G X 8 MM MISC    by Does not apply route.   LANTUS SOLOSTAR 100 UNIT/ML SOLOSTAR PEN    INJECT 43 UNITS INTO THE SKIN DAILY   LEVOTHYROXINE (SYNTHROID, LEVOTHROID) 125 MCG TABLET    Take 1 tablet (125 mcg total) by mouth daily.   QUINAPRIL (ACCUPRIL) 20 MG TABLET    Take 1 tablet (20 mg total) by mouth daily.   TRIAMTERENE-HYDROCHLOROTHIAZIDE (DYAZIDE) 37.5-25 MG CAPSULE    Take 1 each (1 capsule total) by mouth daily.   VITAMIN D, ERGOCALCIFEROL, (DRISDOL) 50000 UNITS CAPS CAPSULE    Take 1 capsule (50,000 Units total) by mouth every 7 (seven) days.    Review of Systems  Constitutional: Positive for chills and fever.  HENT: Positive for congestion and sore throat.   Respiratory: Positive for cough.   Cardiovascular: Negative.   Musculoskeletal: Positive for myalgias.    Social History  Substance Use Topics  . Smoking status: Never Smoker  . Smokeless tobacco: Never Used  . Alcohol use No   Objective:   BP 104/60 (BP Location: Right Arm, Patient Position: Sitting, Cuff Size: Normal)   Pulse 72   Temp 97.6 F (36.4 C) (Oral)   Wt 219 lb 9.6 oz (99.6 kg)   SpO2 99%   BMI 36.77 kg/m   Physical Exam  Constitutional: She is  oriented to person, place, and time. She appears well-developed and well-nourished. No distress.  HENT:  Head: Normocephalic and atraumatic.  Right Ear: Hearing and external ear normal.  Left Ear: Hearing and external ear normal.  Nose: Nose normal.  Mouth/Throat: Oropharynx is clear and moist.  Eyes: Conjunctivae and lids are normal. Right eye exhibits no discharge. Left eye exhibits no discharge. No scleral icterus.  Neck: Neck supple.  Cardiovascular: Normal rate and regular rhythm.   Pulmonary/Chest: Effort normal. No respiratory distress. She has no wheezes. She has no rales.  Abdominal: Soft. Bowel sounds are normal.  Musculoskeletal: Normal range of motion.  Lymphadenopathy:    She has no cervical adenopathy.  Neurological: She is alert and oriented to person, place, and time.  Skin: Skin is intact. No lesion and no rash noted.  Psychiatric: Her speech is normal.        Assessment & Plan:     1. Cough with fever Dry cough started 3-4 days ago with sore throat. Had some elevation of temperature today. No physical signs of bronchitis or pneumonia. Flu test negative. Will treat with Mucinex-DM, encouraged to drink extra fluids and may use Tylenol or Advil for aches or pains. Recheck if no better in 5-7 days. - POCT Influenza A/B - dextromethorphan-guaiFENesin (MUCINEX DM) 30-600 MG 12hr tablet; Take 1 tablet by mouth 2 (two) times daily.  Dispense: 20 tablet; Refill: 0  2. Sore throat Onset 3-4 days ago with cough. No fever today. Throat not red and no exudates. Suspect secondary to PND. Flu test negative. Will treat with warm saltwater gargles and Claritin. Recheck prn - POCT Influenza A/B - loratadine (CLARITIN) 10 MG tablet; Take 1 tablet (10 mg total) by mouth daily.  Dispense: 30 tablet; Refill: 0  3. 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 Followed by Dr. Tedd Sias (endocrinologist). This illness has not caused BS to get off track yet.

## 2017-05-23 NOTE — Patient Instructions (Signed)
Pharyngitis Pharyngitis is redness, pain, and swelling (inflammation) of your pharynx. What are the causes? Pharyngitis is usually caused by infection. Most of the time, these infections are from viruses (viral) and are part of a cold. However, sometimes pharyngitis is caused by bacteria (bacterial). Pharyngitis can also be caused by allergies. Viral pharyngitis may be spread from person to person by coughing, sneezing, and personal items or utensils (cups, forks, spoons, toothbrushes). Bacterial pharyngitis may be spread from person to person by more intimate contact, such as kissing. What are the signs or symptoms? Symptoms of pharyngitis include:  Sore throat.  Tiredness (fatigue).  Low-grade fever.  Headache.  Joint pain and muscle aches.  Skin rashes.  Swollen lymph nodes.  Plaque-like film on throat or tonsils (often seen with bacterial pharyngitis).  How is this diagnosed? Your health care provider will ask you questions about your illness and your symptoms. Your medical history, along with a physical exam, is often all that is needed to diagnose pharyngitis. Sometimes, a rapid strep test is done. Other lab tests may also be done, depending on the suspected cause. How is this treated? Viral pharyngitis will usually get better in 3-4 days without the use of medicine. Bacterial pharyngitis is treated with medicines that kill germs (antibiotics). Follow these instructions at home:  Drink enough water and fluids to keep your urine clear or pale yellow.  Only take over-the-counter or prescription medicines as directed by your health care provider: ? If you are prescribed antibiotics, make sure you finish them even if you start to feel better. ? Do not take aspirin.  Get lots of rest.  Gargle with 8 oz of salt water ( tsp of salt per 1 qt of water) as often as every 1-2 hours to soothe your throat.  Throat lozenges (if you are not at risk for choking) or sprays may be used to  soothe your throat. Contact a health care provider if:  You have large, tender lumps in your neck.  You have a rash.  You cough up green, yellow-brown, or bloody spit. Get help right away if:  Your neck becomes stiff.  You drool or are unable to swallow liquids.  You vomit or are unable to keep medicines or liquids down.  You have severe pain that does not go away with the use of recommended medicines.  You have trouble breathing (not caused by a stuffy nose). This information is not intended to replace advice given to you by your health care provider. Make sure you discuss any questions you have with your health care provider. Document Released: 07/11/2005 Document Revised: 12/17/2015 Document Reviewed: 03/18/2013 Elsevier Interactive Patient Education  2017 [employer] Cough, Adult Coughing is a reflex that clears your throat and your airways. Coughing helps to heal and protect your lungs. It is normal to cough occasionally, but a cough that happens with other symptoms or lasts a long time may be a sign of a condition that needs treatment. A cough may last only 2-3 weeks (acute), or it may last longer than 8 weeks (chronic). What are the causes? Coughing is commonly caused by:  Breathing in substances that irritate your lungs.  A viral or bacterial respiratory infection.  Allergies.  Asthma.  Postnasal drip.  Smoking.  Acid backing up from the stomach into the esophagus (gastroesophageal reflux).  Certain medicines.  Chronic lung problems, including COPD (or rarely, lung cancer).  Other medical conditions such as heart failure.  Follow these instructions at home: Pay  attention to any changes in your symptoms. Take these actions to help with your discomfort:  Take medicines only as told by your health care provider. ? If you were prescribed an antibiotic medicine, take it as told by your health care provider. Do not stop taking the antibiotic even if you start  to feel better. ? Talk with your health care provider before you take a cough suppressant medicine.  Drink enough fluid to keep your urine clear or pale yellow.  If the air is dry, use a cold steam vaporizer or humidifier in your bedroom or your home to help loosen secretions.  Avoid anything that causes you to cough at work or at home.  If your cough is worse at night, try sleeping in a semi-upright position.  Avoid cigarette smoke. If you smoke, quit smoking. If you need help quitting, ask your health care provider.  Avoid caffeine.  Avoid alcohol.  Rest as needed.  Contact a health care provider if:  You have new symptoms.  You cough up pus.  Your cough does not get better after 2-3 weeks, or your cough gets worse.  You cannot control your cough with suppressant medicines and you are losing sleep.  You develop pain that is getting worse or pain that is not controlled with pain medicines.  You have a fever.  You have unexplained weight loss.  You have night sweats. Get help right away if:  You cough up blood.  You have difficulty breathing.  Your heartbeat is very fast. This information is not intended to replace advice given to you by your health care provider. Make sure you discuss any questions you have with your health care provider. Document Released: 01/07/2011 Document Revised: 12/17/2015 Document Reviewed: 09/17/2014 Elsevier Interactive Patient Education  2017 [employer].

## 2017-05-29 DIAGNOSIS — E103511 Type 1 diabetes mellitus with proliferative diabetic retinopathy with macular edema, right eye: Secondary | ICD-10-CM | POA: Diagnosis not present

## 2017-05-29 DIAGNOSIS — D3132 Benign neoplasm of left choroid: Secondary | ICD-10-CM | POA: Diagnosis not present

## 2017-06-07 DIAGNOSIS — E1022 Type 1 diabetes mellitus with diabetic chronic kidney disease: Secondary | ICD-10-CM | POA: Diagnosis not present

## 2017-06-07 DIAGNOSIS — E1042 Type 1 diabetes mellitus with diabetic polyneuropathy: Secondary | ICD-10-CM | POA: Diagnosis not present

## 2017-06-07 DIAGNOSIS — E103313 Type 1 diabetes mellitus with moderate nonproliferative diabetic retinopathy with macular edema, bilateral: Secondary | ICD-10-CM | POA: Diagnosis not present

## 2017-06-07 DIAGNOSIS — N183 Chronic kidney disease, stage 3 (moderate): Secondary | ICD-10-CM | POA: Diagnosis not present

## 2017-07-31 DIAGNOSIS — E103393 Type 1 diabetes mellitus with moderate nonproliferative diabetic retinopathy without macular edema, bilateral: Secondary | ICD-10-CM | POA: Diagnosis not present

## 2017-08-30 DIAGNOSIS — I129 Hypertensive chronic kidney disease with stage 1 through stage 4 chronic kidney disease, or unspecified chronic kidney disease: Secondary | ICD-10-CM | POA: Diagnosis not present

## 2017-08-30 DIAGNOSIS — E1122 Type 2 diabetes mellitus with diabetic chronic kidney disease: Secondary | ICD-10-CM | POA: Diagnosis not present

## 2017-08-30 DIAGNOSIS — E875 Hyperkalemia: Secondary | ICD-10-CM | POA: Diagnosis not present

## 2017-08-30 DIAGNOSIS — R809 Proteinuria, unspecified: Secondary | ICD-10-CM | POA: Diagnosis not present

## 2017-08-30 DIAGNOSIS — N183 Chronic kidney disease, stage 3 (moderate): Secondary | ICD-10-CM | POA: Diagnosis not present

## 2017-09-11 DIAGNOSIS — E103393 Type 1 diabetes mellitus with moderate nonproliferative diabetic retinopathy without macular edema, bilateral: Secondary | ICD-10-CM | POA: Diagnosis not present

## 2017-09-12 DIAGNOSIS — E1022 Type 1 diabetes mellitus with diabetic chronic kidney disease: Secondary | ICD-10-CM | POA: Diagnosis not present

## 2017-09-12 DIAGNOSIS — N183 Chronic kidney disease, stage 3 (moderate): Secondary | ICD-10-CM | POA: Diagnosis not present

## 2017-09-12 DIAGNOSIS — E103313 Type 1 diabetes mellitus with moderate nonproliferative diabetic retinopathy with macular edema, bilateral: Secondary | ICD-10-CM | POA: Diagnosis not present

## 2017-09-12 DIAGNOSIS — E1042 Type 1 diabetes mellitus with diabetic polyneuropathy: Secondary | ICD-10-CM | POA: Diagnosis not present

## 2017-10-10 ENCOUNTER — Ambulatory Visit (INDEPENDENT_AMBULATORY_CARE_PROVIDER_SITE_OTHER): Payer: Medicare Other

## 2017-10-10 ENCOUNTER — Ambulatory Visit (INDEPENDENT_AMBULATORY_CARE_PROVIDER_SITE_OTHER): Payer: Medicare Other | Admitting: Family Medicine

## 2017-10-10 VITALS — BP 144/72 | HR 84 | Temp 98.6°F | Resp 16

## 2017-10-10 VITALS — BP 144/72 | HR 84 | Temp 98.6°F | Ht 65.0 in

## 2017-10-10 DIAGNOSIS — E039 Hypothyroidism, unspecified: Secondary | ICD-10-CM

## 2017-10-10 DIAGNOSIS — Z0000 Encounter for general adult medical examination without abnormal findings: Secondary | ICD-10-CM | POA: Diagnosis not present

## 2017-10-10 DIAGNOSIS — G629 Polyneuropathy, unspecified: Secondary | ICD-10-CM

## 2017-10-10 DIAGNOSIS — E7849 Other hyperlipidemia: Secondary | ICD-10-CM

## 2017-10-10 DIAGNOSIS — I10 Essential (primary) hypertension: Secondary | ICD-10-CM

## 2017-10-10 MED ORDER — GABAPENTIN 100 MG PO CAPS: 100.0000 mg | ORAL_CAPSULE | Freq: Three times a day (TID) | ORAL | 3 refills | Status: DC

## 2017-10-10 NOTE — Progress Notes (Signed)
Patient: Mary [name], Female    DOB: Feb 01, 1946, 72 y.o.   MRN: [id number] Visit Date: 10/10/2017  Today's Provider: [name], MD   Chief Complaint  Patient presents with  . Annual Exam   Subjective:  Mary [name] is a 72 y.o. female who presents today for health maintenance and complete physical. She feels fairly well. She reports exercising none. She reports she is sleeping well.  Immunization History  Administered Date(s) Administered  . Influenza, High Dose Seasonal PF 04/08/2015, 04/22/2016  . Pneumococcal Conjugate-13 06/03/2014  . Pneumococcal Polysaccharide-23 06/06/2012  . Tdap 12/06/2012  . Zoster 12/06/2012   03/18/16 Colonoscopy, [place]-WNL. Repeat 10 years. 01/12/17 Mammogram-normal 03/10/11 BMD-osteopenia 06/03/14 Pap-neg  Review of Systems  Constitutional: Negative.        Hot flashes  HENT: Negative.   Eyes: Negative.   Respiratory: Negative.   Cardiovascular: Negative.   Gastrointestinal: Negative.   Endocrine: Negative.   Genitourinary: Negative.   Musculoskeletal: Negative.   Skin: Negative.   Allergic/Immunologic: Negative.   Neurological: Negative.   Hematological: Negative.   Psychiatric/Behavioral: Negative.     Social History   Socioeconomic History  . Marital status: Divorced    Spouse name: Not on file  . Number of children: 2  . Years of education: Not on file  . Highest education level: Some college, no degree  Social Needs  . Financial resource strain: Not hard at all  . Food insecurity - worry: Never true  . Food insecurity - inability: Never true  . Transportation needs - medical: No  . Transportation needs - non-medical: No  Occupational History  . Occupation: retired  Tobacco Use  . Smoking status: Never Smoker  . Smokeless tobacco: Never Used  Substance and Sexual Activity  . Alcohol use: Yes    Alcohol/week: 0.0 - 1.2 oz  . Drug use: No  . Sexual activity: No  Other Topics Concern  . Not on file   Social History Narrative  . Not on file    Patient Active Problem List   Diagnosis Date Noted  . Chronic kidney disease (CKD) stage G3a/A1, moderately decreased glomerular filtration rate (GFR) between 45-59 mL/min/1.73 square meter and albuminuria creatinine ratio less than 30 mg/g (HCC) 04/08/2015  . Hypothyroidism 04/07/2015  . Obesity 04/07/2015  . Hyperlipidemia 04/07/2015  . Depression, major 04/07/2015  . Claustrophobia 04/07/2015  . Hypertension 04/07/2015  . Diabetes mellitus type 1, uncontrolled (HCC) 09/30/2014    Past Surgical History:  Procedure Laterality Date  . CATARACT EXTRACTION    . CESAREAN SECTION    . COLONOSCOPY WITH PROPOFOL N/A 03/18/2016   redundant colon, otherwise normal repeat 10 years. Surgeon: Christena Deem, MD;  Location: [facility] ENDOSCOPY;  . CYST EXCISION    . EYE SURGERY      Her family history includes Diabetes in her father, sister, and sister; Heart attack in her father; Hypertension in her father, sister, and sister; Lupus in her mother.     Outpatient Encounter Medications as of 10/10/2017  Medication Sig  . amitriptyline (ELAVIL) 25 MG tablet Take 25 mg by mouth at bedtime.   Marland Kitchen aspirin 81 MG tablet Take 81 mg by mouth daily.  Marland Kitchen atorvastatin (LIPITOR) 10 MG tablet Take 1 tablet (10 mg total) by mouth daily.  . carvedilol (COREG) 25 MG tablet Take 1 tablet (25 mg total) by mouth 2 (two) times daily. (Patient taking differently: Take 25 mg by mouth daily. )  . dextromethorphan-guaiFENesin (MUCINEX DM) 30-600  MG 12hr tablet Take 1 tablet by mouth 2 (two) times daily.  Marland Kitchen FLUoxetine (PROZAC) 20 MG capsule Take 1 capsule (20 mg total) by mouth daily.  . insulin lispro (HUMALOG KWIKPEN) 100 UNIT/ML KiwkPen Inject 2-10 Units into the skin.   . Insulin Pen Needle 31G X 8 MM MISC by Does not apply route.  Marland Kitchen LANTUS SOLOSTAR 100 UNIT/ML Solostar Pen INJECT 43 UNITS INTO THE SKIN DAILY (Patient taking differently: Inject 32 Units into the skin daily.  )  . levothyroxine (SYNTHROID, LEVOTHROID) 125 MCG tablet Take 1 tablet (125 mcg total) by mouth daily.  Marland Kitchen loratadine (CLARITIN) 10 MG tablet Take 1 tablet (10 mg total) by mouth daily.  . quinapril (ACCUPRIL) 20 MG tablet Take 1 tablet (20 mg total) by mouth daily.  Marland Kitchen triamterene-hydrochlorothiazide (DYAZIDE) 37.5-25 MG capsule Take 1 each (1 capsule total) by mouth daily.  . Vitamin D, Ergocalciferol, (DRISDOL) 50000 units CAPS capsule Take 1 capsule (50,000 Units total) by mouth every 7 (seven) days.   No facility-administered encounter medications on file as of 10/10/2017.     Patient Care Team: Maple Hudson., MD as PCP - General (Family Medicine) Lamont Dowdy, MD as Consulting Physician (Internal Medicine) Tedd Sias, Marlana Salvage, MD as Physician Assistant (Endocrinology)      Objective:   Vitals:  Vitals:   10/10/17 1414  BP: (!) 144/72  Pulse: 84  Resp: 16  Temp: 98.6 F (37 C)    Physical Exam  Constitutional: She is oriented to person, place, and time. She appears well-developed and well-nourished.  HENT:  Head: Normocephalic and atraumatic.  Right Ear: External ear normal.  Left Ear: External ear normal.  Nose: Nose normal.  Mouth/Throat: Oropharynx is clear and moist.  Eyes: Conjunctivae and EOM are normal. Pupils are equal, round, and reactive to light.  Neck: Normal range of motion. Neck supple.  Cardiovascular: Normal rate, regular rhythm, normal heart sounds and intact distal pulses.  Pulmonary/Chest: Effort normal and breath sounds normal.  Breast exam WNL.  Abdominal: Soft. Bowel sounds are normal.  Musculoskeletal: Normal range of motion.  Neurological: She is alert and oriented to person, place, and time.  Skin: Skin is warm and dry.  Psychiatric: She has a normal mood and affect. Her behavior is normal. Judgment and thought content normal.   Fall Risk  10/10/2017 10/06/2016 10/06/2015  Falls in the past year? Yes No No  Number falls in past yr: 2 or  more - -  Injury with Fall? No - -  Risk for fall due to : Impaired balance/gait;Other (Comment) - -  Risk for fall due to: Comment diabetic neuropathy - -  Follow up Falls prevention discussed - -   Depression screen [facility] 2/9 10/10/2017 10/06/2016 10/06/2015  Decreased Interest 0 0 0  Down, Depressed, Hopeless 1 0 0  PHQ - 2 Score 1 0 0  Altered sleeping - 1 -  Tired, decreased energy - 1 -  Change in appetite - 0 -  Feeling bad or failure about yourself  - 0 -  Trouble concentrating - 0 -  Moving slowly or fidgety/restless - 0 -  Suicidal thoughts - 0 -  PHQ-9 Score - 2 -   Home Exercise  10/10/2017 10/06/2016  Current Exercise Habits The patient does not participate in regular exercise at present Home exercise routine  Type of exercise - walking  Time (Minutes) - 25  Frequency (Times/Week) - 3  Weekly Exercise (Minutes/Week) - 75  Intensity -  Mild  Exercise limited by: neurologic condition(s) None identified   Functional Status Survey: Is the patient deaf or have difficulty hearing?: No Does the patient have difficulty seeing, even when wearing glasses/contacts?: Yes Does the patient have difficulty concentrating, remembering, or making decisions?: No Does the patient have difficulty walking or climbing stairs?: Yes Does the patient have difficulty dressing or bathing?: No Does the patient have difficulty doing errands alone such as visiting a doctor's office or shopping?: No   Assessment & Plan:     Routine Health Maintenance and Physical Exam  Exercise Activities and Dietary recommendations Goals    None      Immunization History  Administered Date(s) Administered  . Influenza, High Dose Seasonal PF 04/08/2015, 04/22/2016  . Pneumococcal Conjugate-13 06/03/2014  . Pneumococcal Polysaccharide-23 06/06/2012  . Tdap 12/06/2012  . Zoster 12/06/2012    Health Maintenance  Topic Date Due  . FOOT EXAM  05/10/1956  . HEMOGLOBIN A1C  03/30/2017  . INFLUENZA VACCINE   10/22/2017 (Originally 02/22/2017)  . OPHTHALMOLOGY EXAM  12/09/2017  . MAMMOGRAM  01/12/2019  . TETANUS/TDAP  12/07/2022  . COLONOSCOPY  03/18/2026  . DEXA SCAN  Completed  . Hepatitis C Screening  Completed  . PNA vac Low Risk Adult  Completed    Pap next year. Discussed health benefits of physical activity, and encouraged her to engage in regular exercise appropriate for her age and condition.  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   I have done the exam and reviewed the chart and it is accurate to the best of my knowledge. Dentist has been used and  any errors in dictation or transcription are unintentional. Julieanne Manson M.D. [facility]

## 2017-10-10 NOTE — Patient Instructions (Signed)
[name] , Thank you for taking time to come for your Medicare Wellness Visit. I appreciate your ongoing commitment to your health goals. Please review the following plan we discussed and let me know if I can assist you in the future.   Screening recommendations/referrals: Colonoscopy: Up to date Mammogram: Up to date Bone Density: Up to date Recommended yearly ophthalmology/optometry visit for glaucoma screening and checkup Recommended yearly dental visit for hygiene and checkup  Vaccinations: Influenza vaccine: Up to date Pneumococcal vaccine: Up to date Tdap vaccine: Up to date Shingles vaccine: Pt declines today.     Advanced directives: Advance directive discussed with you today. I have provided a copy for you to complete at home and have notarized. Once this is complete please bring a copy in to our office so we can scan it into your chart.  Conditions/risks identified: Fall risk prevention; Obesity- recommend to continue current diet plan of cutting out carbohydrates and sugars in daily diet.   Next appointment: 2:00 PM today with Dr Sullivan Lone.   Preventive Care 72 Years and Older, Female Preventive care refers to lifestyle choices and visits with your health care provider that can promote health and wellness. What does preventive care include?  A yearly physical exam. This is also called an annual well check.  Dental exams once or twice a year.  Routine eye exams. Ask your health care provider how often you should have your eyes checked.  Personal lifestyle choices, including:  Daily care of your teeth and gums.  Regular physical activity.  Eating a healthy diet.  Avoiding tobacco and drug use.  Limiting alcohol use.  Practicing safe sex.  Taking low-dose aspirin every day.  Taking vitamin and mineral supplements as recommended by your health care provider. What happens during an annual well check? The services and screenings done by your health care provider  during your annual well check will depend on your age, overall health, lifestyle risk factors, and family history of disease. Counseling  Your health care provider may ask you questions about your:  Alcohol use.  Tobacco use.  Drug use.  Emotional well-being.  Home and relationship well-being.  Sexual activity.  Eating habits.  History of falls.  Memory and ability to understand (cognition).  Work and work Astronomer.  Reproductive health. Screening  You may have the following tests or measurements:  Height, weight, and BMI.  Blood pressure.  Lipid and cholesterol levels. These may be checked every 5 years, or more frequently if you are over 72 years old.  Skin check.  Lung cancer screening. You may have this screening every year starting at age 72 if you have a 30-pack-year history of smoking and currently smoke or have quit within the past 15 years.  Fecal occult blood test (FOBT) of the stool. You may have this test every year starting at age 72.  Flexible sigmoidoscopy or colonoscopy. You may have a sigmoidoscopy every 5 years or a colonoscopy every 10 years starting at age 72.  Hepatitis C blood test.  Hepatitis B blood test.  Sexually transmitted disease (STD) testing.  Diabetes screening. This is done by checking your blood sugar (glucose) after you have not eaten for a while (fasting). You may have this done every 1-3 years.  Bone density scan. This is done to screen for osteoporosis. You may have this done starting at age 72.  Mammogram. This may be done every 1-2 years. Talk to your health care provider about how often you should  have regular mammograms. Talk with your health care provider about your test results, treatment options, and if necessary, the need for more tests. Vaccines  Your health care provider may recommend certain vaccines, such as:  Influenza vaccine. This is recommended every year.  Tetanus, diphtheria, and acellular pertussis  (Tdap, Td) vaccine. You may need a Td booster every 10 years.  Zoster vaccine. You may need this after age 72.  Pneumococcal 13-valent conjugate (PCV13) vaccine. One dose is recommended after age 72.  Pneumococcal polysaccharide (PPSV23) vaccine. One dose is recommended after age 72. Talk to your health care provider about which screenings and vaccines you need and how often you need them. This information is not intended to replace advice given to you by your health care provider. Make sure you discuss any questions you have with your health care provider. Document Released: 08/07/2015 Document Revised: 03/30/2016 Document Reviewed: 05/12/2015 Elsevier Interactive Patient Education  2017 [employer] Prevention in the Home Falls can cause injuries. They can happen to people of all ages. There are many things you can do to make your home safe and to help prevent falls. What can I do on the outside of my home?  Regularly fix the edges of walkways and driveways and fix any cracks.  Remove anything that might make you trip as you walk through a door, such as a raised step or threshold.  Trim any bushes or trees on the path to your home.  Use bright outdoor lighting.  Clear any walking paths of anything that might make someone trip, such as rocks or tools.  Regularly check to see if handrails are loose or broken. Make sure that both sides of any steps have handrails.  Any raised decks and porches should have guardrails on the edges.  Have any leaves, snow, or ice cleared regularly.  Use sand or salt on walking paths during winter.  Clean up any spills in your garage right away. This includes oil or grease spills. What can I do in the bathroom?  Use night lights.  Install grab bars by the toilet and in the tub and shower. Do not use towel bars as grab bars.  Use non-skid mats or decals in the tub or shower.  If you need to sit down in the shower, use a plastic, non-slip  stool.  Keep the floor dry. Clean up any water that spills on the floor as soon as it happens.  Remove soap buildup in the tub or shower regularly.  Attach bath mats securely with double-sided non-slip rug tape.  Do not have throw rugs and other things on the floor that can make you trip. What can I do in the bedroom?  Use night lights.  Make sure that you have a light by your bed that is easy to reach.  Do not use any sheets or blankets that are too big for your bed. They should not hang down onto the floor.  Have a firm chair that has side arms. You can use this for support while you get dressed.  Do not have throw rugs and other things on the floor that can make you trip. What can I do in the kitchen?  Clean up any spills right away.  Avoid walking on wet floors.  Keep items that you use a lot in easy-to-reach places.  If you need to reach something above you, use a strong step stool that has a grab bar.  Keep electrical cords out of  the way.  Do not use floor polish or wax that makes floors slippery. If you must use wax, use non-skid floor wax.  Do not have throw rugs and other things on the floor that can make you trip. What can I do with my stairs?  Do not leave any items on the stairs.  Make sure that there are handrails on both sides of the stairs and use them. Fix handrails that are broken or loose. Make sure that handrails are as long as the stairways.  Check any carpeting to make sure that it is firmly attached to the stairs. Fix any carpet that is loose or worn.  Avoid having throw rugs at the top or bottom of the stairs. If you do have throw rugs, attach them to the floor with carpet tape.  Make sure that you have a light switch at the top of the stairs and the bottom of the stairs. If you do not have them, ask someone to add them for you. What else can I do to help prevent falls?  Wear shoes that:  Do not have high heels.  Have rubber bottoms.  Are  comfortable and fit you well.  Are closed at the toe. Do not wear sandals.  If you use a stepladder:  Make sure that it is fully opened. Do not climb a closed stepladder.  Make sure that both sides of the stepladder are locked into place.  Ask someone to hold it for you, if possible.  Clearly mark and make sure that you can see:  Any grab bars or handrails.  First and last steps.  Where the edge of each step is.  Use tools that help you move around (mobility aids) if they are needed. These include:  Canes.  Walkers.  Scooters.  Crutches.  Turn on the lights when you go into a dark area. Replace any light bulbs as soon as they burn out.  Set up your furniture so you have a clear path. Avoid moving your furniture around.  If any of your floors are uneven, fix them.  If there are any pets around you, be aware of where they are.  Review your medicines with your doctor. Some medicines can make you feel dizzy. This can increase your chance of falling. Ask your doctor what other things that you can do to help prevent falls. This information is not intended to replace advice given to you by your health care provider. Make sure you discuss any questions you have with your health care provider. Document Released: 05/07/2009 Document Revised: 12/17/2015 Document Reviewed: 08/15/2014 Elsevier Interactive Patient Education  2017 [employer].

## 2017-10-10 NOTE — Progress Notes (Signed)
Subjective:   Mary [name] is a 72 y.o. female who presents for Medicare Annual (Subsequent) preventive examination.  Review of Systems:  N/A  Cardiac Risk Factors include: advanced age (>75men, >67 women);diabetes mellitus;dyslipidemia;hypertension;obesity (BMI >30kg/m2)     Objective:     Vitals: BP (!) 144/72 (BP Location: Left Arm)   Pulse 84   Temp 98.6 F (37 C) (Oral)   Ht 5\' 5"  (1.651 m)   BMI 36.54 kg/m   Body mass index is 36.54 kg/m.  Advanced Directives 10/10/2017 10/06/2016 03/18/2016 10/06/2015 10/06/2015  Does Patient Have a Medical Advance Directive? No No No No No  Would patient like information on creating a medical advance directive? Yes (MAU/Ambulatory/Procedural Areas - Information given) - - - -    Tobacco Social History   Tobacco Use  Smoking Status Never Smoker  Smokeless Tobacco Never Used     Counseling given: Not Answered   Clinical Intake:  Pre-visit preparation completed: Yes  Pain : 0-10 Pain Score: 5  Pain Type: Chronic pain Pain Location: Foot Pain Orientation: Left, Right Pain Descriptors / Indicators: Pins and needles, Sharp Pain Frequency: Constant     Nutritional Status: BMI > 30  Obese Nutritional Risks: None Diabetes: Yes(type 1) CBG done?: No Did pt. bring in CBG monitor from home?: No  How often do you need to have someone help you when you read instructions, pamphlets, or other written materials from your doctor or pharmacy?: 1 - Never  Interpreter Needed?: No  Information entered by :: [facility], LPN  Past Medical History:  Diagnosis Date  . Depression   . Diabetes mellitus without complication (HCC)   . History of shingles   . Hyperlipidemia   . Hypertension   . Hypothyroidism   . Obesity    Past Surgical History:  Procedure Laterality Date  . CATARACT EXTRACTION    . CESAREAN SECTION    . COLONOSCOPY WITH PROPOFOL N/A 03/18/2016   redundant colon, otherwise normal repeat 10 years. Surgeon: Christena Deem, MD;  Location: [facility] ENDOSCOPY;  . CYST EXCISION    . EYE SURGERY     Family History  Problem Relation Age of Onset  . Lupus Mother   . Hypertension Father   . Diabetes Father   . Heart attack Father   . Diabetes Sister   . Diabetes Sister   . Hypertension Sister   . Hypertension Sister   . Breast cancer Neg Hx    Social History   Socioeconomic History  . Marital status: Divorced    Spouse name: None  . Number of children: 2  . Years of education: None  . Highest education level: Some college, no degree  Social Needs  . Financial resource strain: Not hard at all  . Food insecurity - worry: Never true  . Food insecurity - inability: Never true  . Transportation needs - medical: No  . Transportation needs - non-medical: No  Occupational History  . Occupation: retired  Tobacco Use  . Smoking status: Never Smoker  . Smokeless tobacco: Never Used  Substance and Sexual Activity  . Alcohol use: Yes    Alcohol/week: 0.0 - 1.2 oz  . Drug use: No  . Sexual activity: No  Other Topics Concern  . None  Social History Narrative  . None    Outpatient Encounter Medications as of 10/10/2017  Medication Sig  . amitriptyline (ELAVIL) 25 MG tablet Take 25 mg by mouth at bedtime.   Marland Kitchen aspirin 81  MG tablet Take 81 mg by mouth daily.  Marland Kitchen atorvastatin (LIPITOR) 10 MG tablet Take 1 tablet (10 mg total) by mouth daily.  . carvedilol (COREG) 25 MG tablet Take 1 tablet (25 mg total) by mouth 2 (two) times daily. (Patient taking differently: Take 25 mg by mouth daily. )  . FLUoxetine (PROZAC) 20 MG capsule Take 1 capsule (20 mg total) by mouth daily.  . insulin lispro (HUMALOG KWIKPEN) 100 UNIT/ML KiwkPen Inject 2-10 Units into the skin.   . Insulin Pen Needle 31G X 8 MM MISC by Does not apply route.  Marland Kitchen LANTUS SOLOSTAR 100 UNIT/ML Solostar Pen INJECT 43 UNITS INTO THE SKIN DAILY (Patient taking differently: Inject 32 Units into the skin daily. )  . levothyroxine (SYNTHROID,  LEVOTHROID) 125 MCG tablet Take 1 tablet (125 mcg total) by mouth daily.  . quinapril (ACCUPRIL) 20 MG tablet Take 1 tablet (20 mg total) by mouth daily.  . Vitamin D, Ergocalciferol, (DRISDOL) 50000 units CAPS capsule Take 1 capsule (50,000 Units total) by mouth every 7 (seven) days.  Marland Kitchen dextromethorphan-guaiFENesin (MUCINEX DM) 30-600 MG 12hr tablet Take 1 tablet by mouth 2 (two) times daily. (Patient not taking: Reported on 10/10/2017)  . loratadine (CLARITIN) 10 MG tablet Take 1 tablet (10 mg total) by mouth daily. (Patient not taking: Reported on 10/10/2017)  . triamterene-hydrochlorothiazide (DYAZIDE) 37.5-25 MG capsule Take 1 each (1 capsule total) by mouth daily. (Patient not taking: Reported on 10/10/2017)   No facility-administered encounter medications on file as of 10/10/2017.     Activities of Daily Living In your present state of health, do you have any difficulty performing the following activities: 10/10/2017  Hearing? N  Vision? 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 N  Walking or climbing stairs? Y  Comment Due to fear of falling / balence issues  Dressing or bathing? N  Doing errands, shopping? N  Comment Does not drive at night.  Preparing Food and eating ? N  Using the Toilet? N  In the past six months, have you accidently leaked urine? N  Do you have problems with loss of bowel control? N  Managing your Medications? N  Managing your Finances? N  Housekeeping or managing your Housekeeping? N  Some recent data might be hidden    Patient Care Team: Maple Hudson., MD as PCP - General (Family Medicine) Lamont Dowdy, MD as Consulting Physician (Internal Medicine) Tedd Sias Marlana Salvage, MD as Physician Assistant (Endocrinology)    Assessment:   This is a routine wellness examination for [name].  Exercise Activities and Dietary recommendations Current Exercise Habits: The patient does not participate in regular exercise  at present, Exercise limited by: neurologic condition(s)  Goals    None      Fall Risk Fall Risk  10/10/2017 10/06/2016 10/06/2015  Falls in the past year? Yes No No  Number falls in past yr: 2 or more - -  Injury with Fall? No - -  Risk for fall due to : Impaired balance/gait;Other (Comment) - -  Risk for fall due to: Comment diabetic neuropathy - -  Follow up Falls prevention discussed - -   Is the patient's home free of loose throw rugs in walkways, pet beds, electrical cords, etc?   yes      Grab bars in the bathroom? no      Handrails on the stairs?   yes      Adequate lighting?   yes  Timed Get Up and Go performed: N/A  Depression Screen PHQ 2/9 Scores 10/10/2017 10/06/2016 10/06/2015  PHQ - 2 Score 1 0 0  PHQ- 9 Score - 2 -     Cognitive Function: Pt declined screening today.      6CIT Screen 10/06/2016  What Year? 0 points  What month? 0 points  What time? 0 points  Count back from 20 0 points  Months in reverse 0 points  Repeat phrase 2 points  Total Score 2    Immunization History  Administered Date(s) Administered  . Influenza, High Dose Seasonal PF 04/08/2015, 04/22/2016  . Pneumococcal Conjugate-13 06/03/2014  . Pneumococcal Polysaccharide-23 06/06/2012  . Tdap 12/06/2012  . Zoster 12/06/2012    Qualifies for Shingles Vaccine? Due for Shingles vaccine. Declined my offer to administer today. Education has been provided regarding the importance of this vaccine. Pt has been advised to call her insurance company to determine her out of pocket expense. Advised she may also receive this vaccine at her local pharmacy or Health Dept. Verbalized acceptance and understanding.  Screening Tests Health Maintenance  Topic Date Due  . FOOT EXAM  05/10/1956  . HEMOGLOBIN A1C  03/30/2017  . INFLUENZA VACCINE  10/22/2017 (Originally 02/22/2017)  . OPHTHALMOLOGY EXAM  12/09/2017  . MAMMOGRAM  01/12/2019  . TETANUS/TDAP  12/07/2022  . COLONOSCOPY  03/18/2026  . DEXA  SCAN  Completed  . Hepatitis C Screening  Completed  . PNA vac Low Risk Adult  Completed    Cancer Screenings: Lung: Low Dose CT Chest recommended if Age 67-80 years, 30 pack-year currently smoking OR have quit w/in 15years. Patient does not qualify. Breast:  Up to date on Mammogram? Yes   Up to date of Bone Density/Dexa? Yes Colorectal: Up to date  Additional Screenings:  Hepatitis C Screening: Up to date    Plan:  I have personally reviewed and addressed the Medicare Annual Wellness questionnaire and have noted the following in the patient's chart:  A. Medical and social history B. Use of alcohol, tobacco or illicit drugs  C. Current medications and supplements D. Functional ability and status E.  Nutritional status F.  Physical activity G. Advance directives H. List of other physicians I.  Hospitalizations, surgeries, and ER visits in previous 12 months J.  Vitals K. Screenings such as hearing and vision if needed, cognitive and depression L. Referrals and appointments - none  In addition, I have reviewed and discussed with patient certain preventive protocols, quality metrics, and best practice recommendations. A written personalized care plan for preventive services as well as general preventive health recommendations were provided to patient.  See attached scanned questionnaire for additional information.   Signed,  Hyacinth Meeker, LPN Nurse Health Advisor   Nurse Recommendations: Pt needs a diabetic foot exam and a Hgb A1c checked today. Please recheck BP as cuff was causing discomfort.

## 2017-10-17 ENCOUNTER — Other Ambulatory Visit: Payer: Self-pay | Admitting: Family Medicine

## 2017-10-17 MED ORDER — CARVEDILOL 12.5 MG PO TABS: 25.0000 mg | ORAL_TABLET | Freq: Two times a day (BID) | ORAL | 12 refills | Status: DC

## 2017-10-17 NOTE — Telephone Encounter (Signed)
[facility] pharmacy faxed a refill request for the following medication. Thanks CC  carvedilol (COREG) 25 MG tablet

## 2017-10-21 ENCOUNTER — Other Ambulatory Visit: Payer: Self-pay | Admitting: Family Medicine

## 2017-10-23 DIAGNOSIS — E103511 Type 1 diabetes mellitus with proliferative diabetic retinopathy with macular edema, right eye: Secondary | ICD-10-CM | POA: Diagnosis not present

## 2017-10-27 ENCOUNTER — Encounter: Payer: Self-pay | Admitting: Family Medicine

## 2017-10-27 ENCOUNTER — Other Ambulatory Visit: Payer: Self-pay | Admitting: Emergency Medicine

## 2017-10-27 DIAGNOSIS — E039 Hypothyroidism, unspecified: Secondary | ICD-10-CM

## 2017-10-27 DIAGNOSIS — E7849 Other hyperlipidemia: Secondary | ICD-10-CM

## 2017-10-27 MED ORDER — LEVOTHYROXINE SODIUM 125 MCG PO TABS: 125.0000 ug | ORAL_TABLET | Freq: Every day | ORAL | 3 refills | Status: DC

## 2017-10-27 MED ORDER — ATORVASTATIN CALCIUM 10 MG PO TABS: 10.0000 mg | ORAL_TABLET | Freq: Every day | ORAL | 3 refills | Status: DC

## 2017-11-20 DIAGNOSIS — E039 Hypothyroidism, unspecified: Secondary | ICD-10-CM | POA: Diagnosis not present

## 2017-11-20 DIAGNOSIS — E7849 Other hyperlipidemia: Secondary | ICD-10-CM | POA: Diagnosis not present

## 2017-11-20 DIAGNOSIS — I10 Essential (primary) hypertension: Secondary | ICD-10-CM | POA: Diagnosis not present

## 2017-11-21 ENCOUNTER — Telehealth: Payer: Self-pay

## 2017-11-21 DIAGNOSIS — E039 Hypothyroidism, unspecified: Secondary | ICD-10-CM

## 2017-11-21 LAB — COMPREHENSIVE METABOLIC PANEL
A/G RATIO: 1.2 (ref 1.2–2.2)
ALT: 12 IU/L (ref 0–32)
AST: 21 IU/L (ref 0–40)
Albumin: 3.6 g/dL (ref 3.5–4.8)
Alkaline Phosphatase: 111 IU/L (ref 39–117)
BUN/Creatinine Ratio: 23 (ref 12–28)
BUN: 38 mg/dL — ABNORMAL HIGH (ref 8–27)
Bilirubin Total: <0.2 mg/dL (ref 0.0–1.2)
CALCIUM: 9.7 mg/dL (ref 8.7–10.3)
CO2: 26 mmol/L (ref 20–29)
Chloride: 101 mmol/L (ref 96–106)
Creatinine, Ser: 1.68 mg/dL — ABNORMAL HIGH (ref 0.57–1.00)
GFR calc Af Amer: 35 mL/min/{1.73_m2} — ABNORMAL LOW (ref >59)
GFR, EST NON AFRICAN AMERICAN: 30 mL/min/{1.73_m2} — AB (ref >59)
GLOBULIN, TOTAL: 3 g/dL (ref 1.5–4.5)
Glucose: 201 mg/dL — ABNORMAL HIGH (ref 65–99)
POTASSIUM: 4.3 mmol/L (ref 3.5–5.2)
SODIUM: 141 mmol/L (ref 134–144)
TOTAL PROTEIN: 6.6 g/dL (ref 6.0–8.5)

## 2017-11-21 LAB — CBC WITH DIFFERENTIAL/PLATELET
BASOS: 0 %
Basophils Absolute: 0 10*3/uL (ref 0.0–0.2)
EOS (ABSOLUTE): 0.2 10*3/uL (ref 0.0–0.4)
Eos: 3 %
HEMATOCRIT: 40 % (ref 34.0–46.6)
Hemoglobin: 13 g/dL (ref 11.1–15.9)
IMMATURE GRANS (ABS): 0 10*3/uL (ref 0.0–0.1)
IMMATURE GRANULOCYTES: 0 %
Lymphocytes Absolute: 2.8 10*3/uL (ref 0.7–3.1)
Lymphs: 38 %
MCH: 30.7 pg (ref 26.6–33.0)
MCHC: 32.5 g/dL (ref 31.5–35.7)
MCV: 94 fL (ref 79–97)
MONOS ABS: 0.7 10*3/uL (ref 0.1–0.9)
Monocytes: 10 %
NEUTROS PCT: 49 %
Neutrophils Absolute: 3.6 10*3/uL (ref 1.4–7.0)
Platelets: 224 10*3/uL (ref 150–379)
RBC: 4.24 x10E6/uL (ref 3.77–5.28)
RDW: 13.1 % (ref 12.3–15.4)
WBC: 7.3 10*3/uL (ref 3.4–10.8)

## 2017-11-21 LAB — LIPID PANEL WITH LDL/HDL RATIO
Cholesterol, Total: 162 mg/dL (ref 100–199)
HDL: 68 mg/dL (ref >39)
LDL Calculated: 80 mg/dL (ref 0–99)
LDL/HDL RATIO: 1.2 ratio (ref 0.0–3.2)
Triglycerides: 71 mg/dL (ref 0–149)
VLDL Cholesterol Cal: 14 mg/dL (ref 5–40)

## 2017-11-21 LAB — TSH: TSH: 9.45 u[IU]/mL — ABNORMAL HIGH (ref 0.450–4.500)

## 2017-11-21 NOTE — Telephone Encounter (Signed)
LMTCB

## 2017-11-21 NOTE — Telephone Encounter (Signed)
lmtcb

## 2017-11-21 NOTE — Telephone Encounter (Signed)
-----   Message from Maple Hudson., MD sent at 11/21/2017  1:37 PM EDT ----- Stable--synthroid from 125 to daily.

## 2017-11-24 MED ORDER — LEVOTHYROXINE SODIUM 150 MCG PO TABS: 150.0000 ug | ORAL_TABLET | Freq: Every day | ORAL | 2 refills | Status: DC

## 2017-11-24 NOTE — Telephone Encounter (Signed)
Patient advised as below. Patient verbalizes understanding and is in agreement with treatment plan.  

## 2017-12-05 DIAGNOSIS — N183 Chronic kidney disease, stage 3 (moderate): Secondary | ICD-10-CM | POA: Diagnosis not present

## 2017-12-05 DIAGNOSIS — E1022 Type 1 diabetes mellitus with diabetic chronic kidney disease: Secondary | ICD-10-CM | POA: Diagnosis not present

## 2017-12-11 ENCOUNTER — Ambulatory Visit: Payer: Self-pay | Admitting: Family Medicine

## 2017-12-13 ENCOUNTER — Ambulatory Visit: Payer: Medicare Other | Admitting: Family Medicine

## 2017-12-21 ENCOUNTER — Other Ambulatory Visit: Payer: Self-pay | Admitting: Family Medicine

## 2017-12-25 DIAGNOSIS — E103511 Type 1 diabetes mellitus with proliferative diabetic retinopathy with macular edema, right eye: Secondary | ICD-10-CM | POA: Diagnosis not present

## 2018-01-01 DIAGNOSIS — E1022 Type 1 diabetes mellitus with diabetic chronic kidney disease: Secondary | ICD-10-CM | POA: Diagnosis not present

## 2018-01-01 DIAGNOSIS — E1042 Type 1 diabetes mellitus with diabetic polyneuropathy: Secondary | ICD-10-CM | POA: Diagnosis not present

## 2018-01-01 DIAGNOSIS — E103313 Type 1 diabetes mellitus with moderate nonproliferative diabetic retinopathy with macular edema, bilateral: Secondary | ICD-10-CM | POA: Diagnosis not present

## 2018-01-01 DIAGNOSIS — R600 Localized edema: Secondary | ICD-10-CM | POA: Diagnosis not present

## 2018-01-07 DIAGNOSIS — E1022 Type 1 diabetes mellitus with diabetic chronic kidney disease: Secondary | ICD-10-CM | POA: Diagnosis not present

## 2018-01-22 ENCOUNTER — Other Ambulatory Visit: Payer: Self-pay | Admitting: Family Medicine

## 2018-01-30 DIAGNOSIS — E103511 Type 1 diabetes mellitus with proliferative diabetic retinopathy with macular edema, right eye: Secondary | ICD-10-CM | POA: Diagnosis not present

## 2018-02-05 ENCOUNTER — Ambulatory Visit: Payer: Medicare Other | Admitting: Family Medicine

## 2018-02-05 ENCOUNTER — Encounter: Payer: Self-pay | Admitting: Family Medicine

## 2018-02-05 VITALS — BP 118/74 | HR 70 | Temp 98.6°F | Resp 16 | Wt 219.0 lb

## 2018-02-05 DIAGNOSIS — E1065 Type 1 diabetes mellitus with hyperglycemia: Secondary | ICD-10-CM | POA: Diagnosis not present

## 2018-02-05 DIAGNOSIS — Z6836 Body mass index (BMI) 36.0-36.9, adult: Secondary | ICD-10-CM

## 2018-02-05 DIAGNOSIS — N1831 Chronic kidney disease, stage 3a: Secondary | ICD-10-CM

## 2018-02-05 DIAGNOSIS — N183 Chronic kidney disease, stage 3 (moderate): Secondary | ICD-10-CM | POA: Diagnosis not present

## 2018-02-05 DIAGNOSIS — I10 Essential (primary) hypertension: Secondary | ICD-10-CM

## 2018-02-05 DIAGNOSIS — M7989 Other specified soft tissue disorders: Secondary | ICD-10-CM

## 2018-02-05 MED ORDER — FUROSEMIDE 20 MG PO TABS: 20.0000 mg | ORAL_TABLET | Freq: Every day | ORAL | 5 refills | Status: DC

## 2018-02-05 NOTE — Progress Notes (Signed)
Patient: Mary [name] Female    DOB: Nov 30, 1945   72 y.o.   MRN: [id number] Visit Date: 02/05/2018  Today's Provider: [name], MD   Chief Complaint  Patient presents with  . Edema   Subjective:    HPI Patient comes in today c/o swelling in both legs. She reports that this has been ongoing for over 1 month. She also mentions that she had a fall about 1-2 weeks ago. She reports that her legs "gave out" on her. She reports that the swelling is worse at different times of the day. She was on a fluid pill, but reports that her nephrologist told her to discontinue the medication.  She also c/o some ongoing bilateral knee pain. DM per endocrine. No neuro issues causing fall,knees are issue.    Allergies  Allergen Reactions  . Hydrocodone Itching     Current Outpatient Medications:  .  amitriptyline (ELAVIL) 25 MG tablet, Take 25 mg by mouth at bedtime. , Disp: , Rfl:  .  aspirin 81 MG tablet, Take 81 mg by mouth daily., Disp: , Rfl:  .  atorvastatin (LIPITOR) 10 MG tablet, Take 1 tablet (10 mg total) by mouth daily., Disp: 90 tablet, Rfl: 3 .  carvedilol (COREG) 12.5 MG tablet, Take 2 tablets (25 mg total) by mouth 2 (two) times daily., Disp: 60 tablet, Rfl: 12 .  FLUoxetine (PROZAC) 20 MG capsule, TAKE 1 CAPSULE BY MOUTH ONCE DAILY, Disp: 90 capsule, Rfl: 3 .  gabapentin (NEURONTIN) 100 MG capsule, Take 1 capsule (100 mg total) by mouth 3 (three) times daily., Disp: 90 capsule, Rfl: 3 .  insulin lispro (HUMALOG KWIKPEN) 100 UNIT/ML KiwkPen, Inject 2-10 Units into the skin. , Disp: , Rfl:  .  Insulin Pen Needle 31G X 8 MM MISC, by Does not apply route., Disp: , Rfl:  .  LANTUS SOLOSTAR 100 UNIT/ML Solostar Pen, INJECT 43 UNITS INTO THE SKIN DAILY (Patient taking differently: Inject 32 Units into the skin daily. ), Disp: 15 mL, Rfl: 0 .  levothyroxine (SYNTHROID, LEVOTHROID) 150 MCG tablet, Take 1 tablet (150 mcg total) by mouth daily., Disp: 90 tablet, Rfl: 2 .   loratadine (CLARITIN) 10 MG tablet, Take 1 tablet (10 mg total) by mouth daily., Disp: 30 tablet, Rfl: 0 .  quinapril (ACCUPRIL) 20 MG tablet, TAKE 1 TABLET BY MOUTH ONCE DAILY, Disp: 90 tablet, Rfl: 3 .  triamterene-hydrochlorothiazide (DYAZIDE) 37.5-25 MG capsule, Take 1 each (1 capsule total) by mouth daily., Disp: 90 capsule, Rfl: 3 .  Vitamin D, Ergocalciferol, (DRISDOL) 50000 units CAPS capsule, TAKE 1 CAPSULE BY MOUTH ONCE A WEEK, Disp: 4 capsule, Rfl: 11 .  dextromethorphan-guaiFENesin (MUCINEX DM) 30-600 MG 12hr tablet, Take 1 tablet by mouth 2 (two) times daily. (Patient not taking: Reported on 02/05/2018), Disp: 20 tablet, Rfl: 0  Review of Systems  Constitutional: Positive for activity change and fatigue. Negative for appetite change, chills, diaphoresis, fever and unexpected weight change.  HENT: Negative.   Eyes: Negative.   Respiratory: Negative for cough, shortness of breath and wheezing.   Cardiovascular: Positive for leg swelling. Negative for chest pain and palpitations.  Endocrine: Negative.   Genitourinary: Negative.   Musculoskeletal: Positive for arthralgias and myalgias.  Allergic/Immunologic: Negative.   Neurological: Negative for dizziness, syncope, weakness, light-headedness and numbness.  Hematological: Negative for adenopathy.  Psychiatric/Behavioral: Negative.     Social History   Tobacco Use  . Smoking status: Never Smoker  . Smokeless tobacco:  Never Used  Substance Use Topics  . Alcohol use: Yes    Alcohol/week: 0.0 - 1.2 oz   Objective:   BP 118/74 (BP Location: Left Arm, Patient Position: Sitting, Cuff Size: Large)   Pulse 70   Temp 98.6 F (37 C)   Resp 16   Wt 219 lb (99.3 kg)   SpO2 96%   BMI 36.44 kg/m  Vitals:   02/05/18 1538  BP: 118/74  Pulse: 70  Resp: 16  Temp: 98.6 F (37 C)  SpO2: 96%  Weight: 219 lb (99.3 kg)     Physical Exam  Constitutional: She is oriented to person, place, and time. She appears well-developed and  well-nourished.  HENT:  Head: Normocephalic and atraumatic.  Right Ear: External ear normal.  Left Ear: External ear normal.  Nose: Nose normal.  Eyes: Conjunctivae are normal. No scleral icterus.  Neck: No thyromegaly present.  Cardiovascular: Normal rate, regular rhythm and normal heart sounds.  Pulmonary/Chest: Effort normal and breath sounds normal.  Abdominal: Soft.  Musculoskeletal: She exhibits no edema.  Mild medial joint line tenderness of both knees. 1+ LE edema  Neurological: She is alert and oriented to person, place, and time.  Skin: Skin is warm and dry.  Psychiatric: She has a normal mood and affect. Her behavior is normal. Judgment and thought content normal.        Assessment & Plan:     1. Swelling of both lower extremities Try support hose.RTC 4-6 weeks. - furosemide (LASIX) 20 MG tablet; Take 1 tablet (20 mg total) by mouth daily.  Dispense: 30 tablet; Refill: 5 2.Obesity 3.TIDM 4.Knee pain Refer to ortho 5.CKD      [name], MD  [facility]

## 2018-02-05 NOTE — Patient Instructions (Signed)
Try support hose to help decrease swelling.

## 2018-02-13 ENCOUNTER — Telehealth: Payer: Self-pay

## 2018-02-13 DIAGNOSIS — M25569 Pain in unspecified knee: Secondary | ICD-10-CM

## 2018-02-13 NOTE — Telephone Encounter (Signed)
Please review

## 2018-02-13 NOTE — Telephone Encounter (Signed)
Please order.  thanks

## 2018-02-13 NOTE — Telephone Encounter (Signed)
Patient's daughter Nadara Mode called to check the status of a Ortho referral for patient's knee pain. It does not look like a referral was ordered. LOV was on 02/05/18 plan was to refer to Ortho. They prefer [facility]. CB# [phone]

## 2018-02-14 NOTE — Telephone Encounter (Signed)
Order for referral placed.

## 2018-02-25 DIAGNOSIS — E1022 Type 1 diabetes mellitus with diabetic chronic kidney disease: Secondary | ICD-10-CM | POA: Diagnosis not present

## 2018-02-27 DIAGNOSIS — M1711 Unilateral primary osteoarthritis, right knee: Secondary | ICD-10-CM | POA: Diagnosis not present

## 2018-02-27 DIAGNOSIS — M25561 Pain in right knee: Secondary | ICD-10-CM | POA: Diagnosis not present

## 2018-02-27 DIAGNOSIS — M25461 Effusion, right knee: Secondary | ICD-10-CM | POA: Diagnosis not present

## 2018-02-27 DIAGNOSIS — M1712 Unilateral primary osteoarthritis, left knee: Secondary | ICD-10-CM | POA: Diagnosis not present

## 2018-03-12 DIAGNOSIS — E1122 Type 2 diabetes mellitus with diabetic chronic kidney disease: Secondary | ICD-10-CM | POA: Diagnosis not present

## 2018-03-12 DIAGNOSIS — N183 Chronic kidney disease, stage 3 (moderate): Secondary | ICD-10-CM | POA: Diagnosis not present

## 2018-03-12 DIAGNOSIS — R809 Proteinuria, unspecified: Secondary | ICD-10-CM | POA: Diagnosis not present

## 2018-03-12 DIAGNOSIS — I129 Hypertensive chronic kidney disease with stage 1 through stage 4 chronic kidney disease, or unspecified chronic kidney disease: Secondary | ICD-10-CM | POA: Diagnosis not present

## 2018-03-12 DIAGNOSIS — E875 Hyperkalemia: Secondary | ICD-10-CM | POA: Diagnosis not present

## 2018-03-19 ENCOUNTER — Ambulatory Visit (INDEPENDENT_AMBULATORY_CARE_PROVIDER_SITE_OTHER): Payer: Medicare Other | Admitting: Family Medicine

## 2018-03-19 ENCOUNTER — Encounter: Payer: Self-pay | Admitting: Family Medicine

## 2018-03-19 VITALS — BP 128/70 | HR 76 | Temp 99.0°F | Wt 217.0 lb

## 2018-03-19 DIAGNOSIS — E1065 Type 1 diabetes mellitus with hyperglycemia: Secondary | ICD-10-CM | POA: Diagnosis not present

## 2018-03-19 DIAGNOSIS — I10 Essential (primary) hypertension: Secondary | ICD-10-CM | POA: Diagnosis not present

## 2018-03-19 DIAGNOSIS — N183 Chronic kidney disease, stage 3 (moderate): Secondary | ICD-10-CM

## 2018-03-19 DIAGNOSIS — Z6836 Body mass index (BMI) 36.0-36.9, adult: Secondary | ICD-10-CM

## 2018-03-19 DIAGNOSIS — M7989 Other specified soft tissue disorders: Secondary | ICD-10-CM | POA: Diagnosis not present

## 2018-03-19 DIAGNOSIS — N1831 Chronic kidney disease, stage 3a: Secondary | ICD-10-CM

## 2018-03-19 NOTE — Progress Notes (Signed)
Patient: Mary [name] Female    DOB: 1945-07-31   72 y.o.   MRN: [id number] Visit Date: 03/19/2018  Today's Provider: [name], MD   Chief Complaint  Patient presents with  . Edema   Subjective:    HPI Patient is a 72 year old female that comes in today for a follow up. She was last seen in the office 1 month ago due to swelling in both lower extremities. She was advised to start support hose, and was started on a fluid pill (Lasix 20mg  daily). She reports that the fluid pill helped, but she still has swelling. She has not started wearing the support hose yet. It has improved and pt has been told to try support hose but she has not. She would like to get off of Gabapentin if possible.    Allergies  Allergen Reactions  . Hydrocodone Itching     Current Outpatient Medications:  .  aspirin 81 MG tablet, Take 81 mg by mouth daily., Disp: , Rfl:  .  atorvastatin (LIPITOR) 10 MG tablet, Take 1 tablet (10 mg total) by mouth daily., Disp: 90 tablet, Rfl: 3 .  carvedilol (COREG) 12.5 MG tablet, Take 2 tablets (25 mg total) by mouth 2 (two) times daily., Disp: 60 tablet, Rfl: 12 .  FLUoxetine (PROZAC) 20 MG capsule, TAKE 1 CAPSULE BY MOUTH ONCE DAILY, Disp: 90 capsule, Rfl: 3 .  furosemide (LASIX) 20 MG tablet, Take 1 tablet (20 mg total) by mouth daily., Disp: 30 tablet, Rfl: 5 .  gabapentin (NEURONTIN) 100 MG capsule, Take 1 capsule (100 mg total) by mouth 3 (three) times daily., Disp: 90 capsule, Rfl: 3 .  insulin lispro (HUMALOG KWIKPEN) 100 UNIT/ML KiwkPen, Inject 2-10 Units into the skin. , Disp: , Rfl:  .  Insulin Pen Needle 31G X 8 MM MISC, by Does not apply route., Disp: , Rfl:  .  levothyroxine (SYNTHROID, LEVOTHROID) 150 MCG tablet, Take 1 tablet (150 mcg total) by mouth daily., Disp: 90 tablet, Rfl: 2 .  loratadine (CLARITIN) 10 MG tablet, Take 1 tablet (10 mg total) by mouth daily., Disp: 30 tablet, Rfl: 0 .  quinapril (ACCUPRIL) 20 MG tablet, TAKE 1 TABLET BY  MOUTH ONCE DAILY, Disp: 90 tablet, Rfl: 3 .  triamterene-hydrochlorothiazide (DYAZIDE) 37.5-25 MG capsule, Take 1 each (1 capsule total) by mouth daily., Disp: 90 capsule, Rfl: 3 .  Vitamin D, Ergocalciferol, (DRISDOL) 50000 units CAPS capsule, TAKE 1 CAPSULE BY MOUTH ONCE A WEEK, Disp: 4 capsule, Rfl: 11 .  amitriptyline (ELAVIL) 25 MG tablet, Take 25 mg by mouth at bedtime. , Disp: , Rfl:  .  dextromethorphan-guaiFENesin (MUCINEX DM) 30-600 MG 12hr tablet, Take 1 tablet by mouth 2 (two) times daily. (Patient not taking: Reported on 02/05/2018), Disp: 20 tablet, Rfl: 0 .  LANTUS SOLOSTAR 100 UNIT/ML Solostar Pen, INJECT 43 UNITS INTO THE SKIN DAILY (Patient not taking: Reported on 03/19/2018), Disp: 15 mL, Rfl: 0  Review of Systems  Constitutional: Negative.   HENT: Negative.   Respiratory: Negative.   Cardiovascular: Negative.   Gastrointestinal: Negative.   Musculoskeletal: Positive for joint swelling.       Mainly left ankle.  Allergic/Immunologic: Negative.   Psychiatric/Behavioral: Negative.     Social History   Tobacco Use  . Smoking status: Never Smoker  . Smokeless tobacco: Never Used  Substance Use Topics  . Alcohol use: Yes    Alcohol/week: 0.0 - 2.0 standard drinks   Objective:  BP 128/70 (BP Location: Left Arm, Patient Position: Sitting, Cuff Size: Large)   Pulse 76   Temp 99 F (37.2 C)   Wt 217 lb (98.4 kg)   SpO2 98%   BMI 36.11 kg/m  Vitals:   03/19/18 1340  BP: 128/70  Pulse: 76  Temp: 99 F (37.2 C)  SpO2: 98%  Weight: 217 lb (98.4 kg)     Physical Exam  Constitutional: She is oriented to person, place, and time. She appears well-developed and well-nourished.  HENT:  Head: Normocephalic and atraumatic.  Eyes: Conjunctivae are normal. No scleral icterus.  Cardiovascular: Normal rate, regular rhythm and normal heart sounds.  Pulmonary/Chest: Effort normal and breath sounds normal.  Abdominal: Soft.  Musculoskeletal:  Trace pedal edema--left  greater than right.  Neurological: She is alert and oriented to person, place, and time.  Skin: Skin is warm and dry.  Psychiatric: She has a normal mood and affect. Her behavior is normal. Judgment and thought content normal.        Assessment & Plan:     1. Swelling of both lower extremities Improved some--try support hose and wean gabapentin as she notices no improvement with this RTC 3 months. 2. Essential hypertension 3.Neuropathy 4.DM      I have done the exam and reviewed the above chart and it is accurate to the best of my knowledge. Dentist has been used in this note in any air is in the dictation or transcription are unintentional.  [name], MD  [facility]

## 2018-03-19 NOTE — Patient Instructions (Signed)
Wean off of Gabapentin for 1 month.

## 2018-03-28 ENCOUNTER — Ambulatory Visit: Payer: Self-pay | Admitting: Family Medicine

## 2018-04-03 DIAGNOSIS — N183 Chronic kidney disease, stage 3 (moderate): Secondary | ICD-10-CM | POA: Diagnosis not present

## 2018-04-03 DIAGNOSIS — E1022 Type 1 diabetes mellitus with diabetic chronic kidney disease: Secondary | ICD-10-CM | POA: Diagnosis not present

## 2018-04-10 DIAGNOSIS — E103313 Type 1 diabetes mellitus with moderate nonproliferative diabetic retinopathy with macular edema, bilateral: Secondary | ICD-10-CM | POA: Diagnosis not present

## 2018-04-10 DIAGNOSIS — E1042 Type 1 diabetes mellitus with diabetic polyneuropathy: Secondary | ICD-10-CM | POA: Diagnosis not present

## 2018-04-10 DIAGNOSIS — E1022 Type 1 diabetes mellitus with diabetic chronic kidney disease: Secondary | ICD-10-CM | POA: Diagnosis not present

## 2018-04-10 DIAGNOSIS — N183 Chronic kidney disease, stage 3 (moderate): Secondary | ICD-10-CM | POA: Diagnosis not present

## 2018-04-24 DIAGNOSIS — E1065 Type 1 diabetes mellitus with hyperglycemia: Secondary | ICD-10-CM | POA: Diagnosis not present

## 2018-05-06 DIAGNOSIS — E1022 Type 1 diabetes mellitus with diabetic chronic kidney disease: Secondary | ICD-10-CM | POA: Diagnosis not present

## 2018-05-21 DIAGNOSIS — D3132 Benign neoplasm of left choroid: Secondary | ICD-10-CM | POA: Diagnosis not present

## 2018-05-21 DIAGNOSIS — E103511 Type 1 diabetes mellitus with proliferative diabetic retinopathy with macular edema, right eye: Secondary | ICD-10-CM | POA: Diagnosis not present

## 2018-05-21 LAB — HM DIABETES EYE EXAM

## 2018-05-25 DIAGNOSIS — E1065 Type 1 diabetes mellitus with hyperglycemia: Secondary | ICD-10-CM | POA: Diagnosis not present

## 2018-05-26 ENCOUNTER — Ambulatory Visit (INDEPENDENT_AMBULATORY_CARE_PROVIDER_SITE_OTHER): Payer: Medicare Other

## 2018-05-26 DIAGNOSIS — Z23 Encounter for immunization: Secondary | ICD-10-CM

## 2018-06-13 ENCOUNTER — Ambulatory Visit: Payer: Medicare Other | Admitting: Family Medicine

## 2018-06-13 VITALS — BP 147/74 | HR 70 | Temp 98.4°F | Resp 16 | Wt 219.0 lb

## 2018-06-13 DIAGNOSIS — I10 Essential (primary) hypertension: Secondary | ICD-10-CM | POA: Diagnosis not present

## 2018-06-13 DIAGNOSIS — E1065 Type 1 diabetes mellitus with hyperglycemia: Secondary | ICD-10-CM | POA: Diagnosis not present

## 2018-06-13 DIAGNOSIS — E7849 Other hyperlipidemia: Secondary | ICD-10-CM | POA: Diagnosis not present

## 2018-06-13 DIAGNOSIS — E039 Hypothyroidism, unspecified: Secondary | ICD-10-CM | POA: Diagnosis not present

## 2018-06-13 NOTE — Progress Notes (Signed)
Mary [name]  MRN: [id number] DOB: Oct 18, 1945  Subjective:  HPI   The patient is a 72 year old female who presents today for follow up of her chronic health.  She was last seen on 03/19/28.  Hypertension-Blood pressure has been stable. BP Readings from Last 3 Encounters:  06/13/18 (!) 147/74  03/19/18 128/70  02/05/18 118/74   Hypothyroidism Lab Results  Component Value Date   TSH 9.450 (H) 11/20/2017   Hyperlipidemia Lab Results  Component Value Date   CHOL 162 11/20/2017   HDL 68 11/20/2017   LDLCALC 80 11/20/2017   TRIG 71 11/20/2017   Depression- Depression screen [facility] 2/9 10/10/2017 10/06/2016 10/06/2015  Decreased Interest 0 0 0  Down, Depressed, Hopeless 1 0 0  PHQ - 2 Score 1 0 0  Altered sleeping - 1 -  Tired, decreased energy - 1 -  Change in appetite - 0 -  Feeling bad or failure about yourself  - 0 -  Trouble concentrating - 0 -  Moving slowly or fidgety/restless - 0 -  Suicidal thoughts - 0 -  PHQ-9 Score - 2 -   Diabetes-Followed by Endocrinology   Patient Active Problem List   Diagnosis Date Noted  . Chronic kidney disease (CKD) stage G3a/A1, moderately decreased glomerular filtration rate (GFR) between 45-59 mL/min/1.73 square meter and albuminuria creatinine ratio less than 30 mg/g (HCC) 04/08/2015  . Hypothyroidism 04/07/2015  . Obesity 04/07/2015  . Hyperlipidemia 04/07/2015  . Depression, major 04/07/2015  . Claustrophobia 04/07/2015  . Hypertension 04/07/2015  . Diabetes mellitus type 1, uncontrolled (HCC) 09/30/2014    Past Medical History:  Diagnosis Date  . Depression   . Diabetes mellitus without complication (HCC)   . History of shingles   . Hyperlipidemia   . Hypertension   . Hypothyroidism   . Obesity     Social History   Socioeconomic History  . Marital status: Divorced    Spouse name: Not on file  . Number of children: 2  . Years of education: Not on file  . Highest education level: Some college, no degree    Occupational History  . Occupation: retired  Engineer, production  . Financial resource strain: Not hard at all  . Food insecurity:    Worry: Never true    Inability: Never true  . Transportation needs:    Medical: No    Non-medical: No  Tobacco Use  . Smoking status: Never Smoker  . Smokeless tobacco: Never Used  Substance and Sexual Activity  . Alcohol use: Yes    Alcohol/week: 0.0 - 2.0 standard drinks  . Drug use: No  . Sexual activity: Never  Lifestyle  . Physical activity:    Days per week: Not on file    Minutes per session: Not on file  . Stress: Only a little  Relationships  . Social connections:    Talks on phone: Not on file    Gets together: Not on file    Attends religious service: Not on file    Active member of club or organization: Not on file    Attends meetings of clubs or organizations: Not on file    Relationship status: Not on file  . Intimate partner violence:    Fear of current or ex partner: Not on file    Emotionally abused: Not on file    Physically abused: Not on file    Forced sexual activity: Not on file  Other Topics Concern  . Not on  file  Social History Narrative  . Not on file    Outpatient Encounter Medications as of 06/13/2018  Medication Sig Note  . aspirin 81 MG tablet Take 81 mg by mouth daily.   Marland Kitchen atorvastatin (LIPITOR) 10 MG tablet Take 1 tablet (10 mg total) by mouth daily.   . carvedilol (COREG) 12.5 MG tablet Take 2 tablets (25 mg total) by mouth 2 (two) times daily.   Marland Kitchen dextromethorphan-guaiFENesin (MUCINEX DM) 30-600 MG 12hr tablet Take 1 tablet by mouth 2 (two) times daily.   Marland Kitchen FLUoxetine (PROZAC) 20 MG capsule TAKE 1 CAPSULE BY MOUTH ONCE DAILY   . furosemide (LASIX) 20 MG tablet Take 1 tablet (20 mg total) by mouth daily.   Marland Kitchen gabapentin (NEURONTIN) 100 MG capsule Take 1 capsule (100 mg total) by mouth 3 (three) times daily. 06/13/2018: Weaning down, currently on 1 at bedtime  . insulin lispro (HUMALOG KWIKPEN) 100 UNIT/ML  KiwkPen Inject 2-10 Units into the skin.    . Insulin Pen Needle 31G X 8 MM MISC by Does not apply route.   Marland Kitchen LANTUS SOLOSTAR 100 UNIT/ML Solostar Pen INJECT 43 UNITS INTO THE SKIN DAILY (Patient taking differently: Inject 32 Units into the skin daily. )   . levothyroxine (SYNTHROID, LEVOTHROID) 150 MCG tablet Take 1 tablet (150 mcg total) by mouth daily.   Marland Kitchen loratadine (CLARITIN) 10 MG tablet Take 1 tablet (10 mg total) by mouth daily.   . quinapril (ACCUPRIL) 20 MG tablet TAKE 1 TABLET BY MOUTH ONCE DAILY   . triamterene-hydrochlorothiazide (DYAZIDE) 37.5-25 MG capsule Take 1 each (1 capsule total) by mouth daily.   . Vitamin D, Ergocalciferol, (DRISDOL) 50000 units CAPS capsule TAKE 1 CAPSULE BY MOUTH ONCE A WEEK   . amitriptyline (ELAVIL) 25 MG tablet Take 25 mg by mouth at bedtime.     No facility-administered encounter medications on file as of 06/13/2018.     Allergies  Allergen Reactions  . Hydrocodone Itching    Review of Systems  Constitutional: Negative for fever and malaise/fatigue.  Eyes: Negative.   Respiratory: Negative for cough, shortness of breath and wheezing.   Cardiovascular: Negative for chest pain, palpitations, orthopnea, claudication and leg swelling.  Gastrointestinal: Negative.   Genitourinary: Negative.   Skin: Negative.   Neurological: Negative.   Endo/Heme/Allergies: Negative.   Psychiatric/Behavioral: Negative.     Objective:  BP (!) 147/74 (BP Location: Right Arm, Patient Position: Sitting, Cuff Size: Large)   Pulse 70   Temp 98.4 F (36.9 C) (Oral)   Resp 16   Wt 219 lb (99.3 kg)   BMI 36.44 kg/m   Physical Exam  Constitutional: She is oriented to person, place, and time and well-developed, well-nourished, and in no distress.  HENT:  Head: Normocephalic and atraumatic.  Right Ear: External ear normal.  Left Ear: External ear normal.  Nose: Nose normal.  Eyes: Conjunctivae are normal. No scleral icterus.  Neck: No thyromegaly present.    Cardiovascular: Normal rate, regular rhythm and normal heart sounds.  Pulmonary/Chest: Effort normal and breath sounds normal.  Abdominal: Soft.  Musculoskeletal: She exhibits no edema.  Neurological: She is alert and oriented to person, place, and time. Gait normal. GCS score is 15.  Skin: Skin is warm and dry.  Psychiatric: Mood, memory, affect and judgment normal.    Assessment and Plan :   1. Essential hypertension  controlled.  Patient will follow blood pressure at home and readings on next visit.  The first time they have been  elevated in recent times. 2. Hypothyroidism, unspecified type  - TSH  3. Other hyperlipidemia Treated with Lipitor  4. Uncontrolled type 1 diabetes mellitus with hyperglycemia (HCC) Followed by endocrinology/Dr. Dalbert Mayotte.  I have done the exam and reviewed the chart and it is accurate to the best of my knowledge. Dentist has been used and  any errors in dictation or transcription are unintentional. Julieanne Manson M.D. [facility]     HPI, Exam and A&P Transcribed under the direction and in the presence of [name]., MD. Electronically Signed: Janey Greaser, RMA

## 2018-06-14 LAB — TSH: TSH: 1.1 u[IU]/mL (ref 0.450–4.500)

## 2018-06-20 ENCOUNTER — Other Ambulatory Visit: Payer: Self-pay | Admitting: Family Medicine

## 2018-06-20 DIAGNOSIS — E039 Hypothyroidism, unspecified: Secondary | ICD-10-CM

## 2018-06-24 DIAGNOSIS — E1065 Type 1 diabetes mellitus with hyperglycemia: Secondary | ICD-10-CM | POA: Diagnosis not present

## 2018-06-24 DIAGNOSIS — E1022 Type 1 diabetes mellitus with diabetic chronic kidney disease: Secondary | ICD-10-CM | POA: Diagnosis not present

## 2018-06-28 DIAGNOSIS — M170 Bilateral primary osteoarthritis of knee: Secondary | ICD-10-CM | POA: Diagnosis not present

## 2018-07-01 DIAGNOSIS — M170 Bilateral primary osteoarthritis of knee: Secondary | ICD-10-CM | POA: Insufficient documentation

## 2018-07-01 DIAGNOSIS — M1711 Unilateral primary osteoarthritis, right knee: Secondary | ICD-10-CM | POA: Insufficient documentation

## 2018-07-03 DIAGNOSIS — E1042 Type 1 diabetes mellitus with diabetic polyneuropathy: Secondary | ICD-10-CM | POA: Diagnosis not present

## 2018-07-03 DIAGNOSIS — E103313 Type 1 diabetes mellitus with moderate nonproliferative diabetic retinopathy with macular edema, bilateral: Secondary | ICD-10-CM | POA: Diagnosis not present

## 2018-07-03 DIAGNOSIS — E1022 Type 1 diabetes mellitus with diabetic chronic kidney disease: Secondary | ICD-10-CM | POA: Diagnosis not present

## 2018-07-03 DIAGNOSIS — N183 Chronic kidney disease, stage 3 (moderate): Secondary | ICD-10-CM | POA: Diagnosis not present

## 2018-08-04 ENCOUNTER — Other Ambulatory Visit: Payer: Self-pay | Admitting: Family Medicine

## 2018-08-04 DIAGNOSIS — M7989 Other specified soft tissue disorders: Secondary | ICD-10-CM

## 2018-08-06 ENCOUNTER — Other Ambulatory Visit: Payer: Self-pay | Admitting: Family Medicine

## 2018-08-12 DIAGNOSIS — E1065 Type 1 diabetes mellitus with hyperglycemia: Secondary | ICD-10-CM | POA: Diagnosis not present

## 2018-08-29 ENCOUNTER — Other Ambulatory Visit: Payer: Self-pay | Admitting: Family Medicine

## 2018-09-04 DIAGNOSIS — E1122 Type 2 diabetes mellitus with diabetic chronic kidney disease: Secondary | ICD-10-CM | POA: Diagnosis not present

## 2018-09-04 DIAGNOSIS — N2581 Secondary hyperparathyroidism of renal origin: Secondary | ICD-10-CM | POA: Diagnosis not present

## 2018-09-04 DIAGNOSIS — R809 Proteinuria, unspecified: Secondary | ICD-10-CM | POA: Diagnosis not present

## 2018-09-04 DIAGNOSIS — I129 Hypertensive chronic kidney disease with stage 1 through stage 4 chronic kidney disease, or unspecified chronic kidney disease: Secondary | ICD-10-CM | POA: Diagnosis not present

## 2018-09-04 NOTE — Discharge Instructions (Signed)
°  Instructions after Total Knee Replacement ° ° Hetal Proano P. Roldan Laforest, Jr., M.D.    ° Dept. of Orthopaedics & Sports Medicine ° [facility] ° [address] ° [place], [place]  [address] ° Phone: [phone]   Fax: [phone] ° °  °DIET: °• Drink plenty of non-alcoholic fluids. °• Resume your normal diet. Include foods high in fiber. ° °ACTIVITY:  °• You may use crutches or a walker with weight-bearing as tolerated, unless instructed otherwise. °• You may be weaned off of the walker or crutches by your Physical Therapist.  °• Do NOT place pillows under the knee. Anything placed under the knee could limit your ability to straighten the knee.   °• Continue doing gentle exercises. Exercising will reduce the pain and swelling, increase motion, and prevent muscle weakness.   °• Please continue to use the TED compression stockings for 6 weeks. You may remove the stockings at night, but should reapply them in the morning. °• Do not drive or operate any equipment until instructed. ° °WOUND CARE:  °• Continue to use the PolarCare or ice packs periodically to reduce pain and swelling. °• You may bathe or shower after the staples are removed at the first office visit following surgery. ° °MEDICATIONS: °• You may resume your regular medications. °• Please take the pain medication as prescribed on the medication. °• Do not take pain medication on an empty stomach. °• You have been given a prescription for a blood thinner (Lovenox or Coumadin). Please take the medication as instructed. (NOTE: After completing a 2 week course of Lovenox, take one Enteric-coated aspirin once a day. This along with elevation will help reduce the possibility of phlebitis in your operated leg.) °• Do not drive or drink alcoholic beverages when taking pain medications. ° °CALL THE OFFICE FOR: °• Temperature above 101 degrees °• Excessive bleeding or drainage on the dressing. °• Excessive swelling, coldness, or paleness of the toes. °• Persistent  nausea and vomiting. ° °FOLLOW-UP:  °• You should have an appointment to return to the office in 10-14 days after surgery. °• Arrangements have been made for continuation of Physical Therapy (either home therapy or outpatient therapy). °  °

## 2018-09-05 ENCOUNTER — Other Ambulatory Visit: Payer: Self-pay

## 2018-09-05 ENCOUNTER — Encounter
Admission: RE | Admit: 2018-09-05 | Discharge: 2018-09-05 | Disposition: A | Discharge status: Home / Self Care | Payer: Medicare Other | Source: Ambulatory Visit | Attending: Orthopedic Surgery | Admitting: Orthopedic Surgery

## 2018-09-05 DIAGNOSIS — I10 Essential (primary) hypertension: Secondary | ICD-10-CM | POA: Insufficient documentation

## 2018-09-05 DIAGNOSIS — Z01818 Encounter for other preprocedural examination: Secondary | ICD-10-CM | POA: Diagnosis not present

## 2018-09-05 HISTORY — DX: Type 2 diabetes mellitus with unspecified diabetic retinopathy without macular edema: E11.319

## 2018-09-05 HISTORY — DX: Carpal tunnel syndrome, bilateral upper limbs: G56.03

## 2018-09-05 HISTORY — DX: Chronic kidney disease, unspecified: N18.9

## 2018-09-05 HISTORY — DX: Type 1 diabetes mellitus with diabetic polyneuropathy: E10.42

## 2018-09-05 HISTORY — DX: Unspecified osteoarthritis, unspecified site: M19.90

## 2018-09-05 LAB — COMPREHENSIVE METABOLIC PANEL
ALT: 13 U/L (ref 0–44)
AST: 19 U/L (ref 15–41)
Albumin: 3.6 g/dL (ref 3.5–5.0)
Alkaline Phosphatase: 68 U/L (ref 38–126)
Anion gap: 6 (ref 5–15)
BUN: 35 mg/dL — ABNORMAL HIGH (ref 8–23)
CO2: 29 mmol/L (ref 22–32)
Calcium: 9.6 mg/dL (ref 8.9–10.3)
Chloride: 102 mmol/L (ref 98–111)
Creatinine, Ser: 1.16 mg/dL — ABNORMAL HIGH (ref 0.44–1.00)
GFR calc Af Amer: 54 mL/min — ABNORMAL LOW (ref >60)
GFR calc non Af Amer: 47 mL/min — ABNORMAL LOW (ref >60)
Glucose, Bld: 161 mg/dL — ABNORMAL HIGH (ref 70–99)
Potassium: 4.4 mmol/L (ref 3.5–5.1)
Sodium: 137 mmol/L (ref 135–145)
Total Bilirubin: 0.8 mg/dL (ref 0.3–1.2)
Total Protein: 7.2 g/dL (ref 6.5–8.1)

## 2018-09-05 LAB — C-REACTIVE PROTEIN: CRP: <0.8 mg/dL (ref <1.0)

## 2018-09-05 LAB — CBC
HCT: 40.4 % (ref 36.0–46.0)
Hemoglobin: 12.8 g/dL (ref 12.0–15.0)
MCH: 30.3 pg (ref 26.0–34.0)
MCHC: 31.7 g/dL (ref 30.0–36.0)
MCV: 95.5 fL (ref 80.0–100.0)
Platelets: 191 10*3/uL (ref 150–400)
RBC: 4.23 MIL/uL (ref 3.87–5.11)
RDW: 13.2 % (ref 11.5–15.5)
WBC: 7.6 10*3/uL (ref 4.0–10.5)
nRBC: 0 % (ref 0.0–0.2)

## 2018-09-05 LAB — PROTIME-INR
INR: 0.99
Prothrombin Time: 13 seconds (ref 11.4–15.2)

## 2018-09-05 LAB — URINALYSIS, ROUTINE W REFLEX MICROSCOPIC
Bilirubin Urine: NEGATIVE
Glucose, UA: NEGATIVE mg/dL
Hgb urine dipstick: NEGATIVE
Ketones, ur: NEGATIVE mg/dL
Leukocytes,Ua: NEGATIVE
Nitrite: NEGATIVE
Protein, ur: NEGATIVE mg/dL
Specific Gravity, Urine: 1.016 (ref 1.005–1.030)
pH: 6 (ref 5.0–8.0)

## 2018-09-05 LAB — SURGICAL PCR SCREEN
MRSA, PCR: NEGATIVE
Staphylococcus aureus: NEGATIVE

## 2018-09-05 LAB — SEDIMENTATION RATE: Sed Rate: 12 mm/hr (ref 0–30)

## 2018-09-05 LAB — TYPE AND SCREEN
ABO/RH(D): O POS
Antibody Screen: NEGATIVE

## 2018-09-05 LAB — HEMOGLOBIN A1C
HEMOGLOBIN A1C: 7.4 % — AB (ref 4.8–5.6)
Mean Plasma Glucose: 165.68 mg/dL

## 2018-09-05 LAB — APTT: aPTT: 26 seconds (ref 24–36)

## 2018-09-05 NOTE — Patient Instructions (Signed)
Your procedure is scheduled on:  Monday, September 17, 2018 Report to Day Surgery on the 2nd floor of the [facility] To find out your arrival time, please call [phone] between 1PM - 3PM on: Friday, September 14, 2018  REMEMBER: Instructions that are not followed completely may result in serious medical risk, up to and including death; or upon the discretion of your surgeon and anesthesiologist your surgery may need to be rescheduled.  Do not eat food after midnight the night before surgery.  No gum chewing, lozengers or hard candies.  You may however, drink water up to 2 hours before you are scheduled to arrive for your surgery. Do not drink anything within 2 hours of the start of your surgery.  No Alcohol for 24 hours before or after surgery.  No Smoking including e-cigarettes for 24 hours prior to surgery.  No chewable tobacco products for at least 6 hours prior to surgery.  No nicotine patches on the day of surgery.  On the morning of surgery brush your teeth with toothpaste and water, you may rinse your mouth with mouthwash if you wish. Do not swallow any toothpaste or mouthwash.  Notify your doctor if there is any change in your medical condition (cold, fever, infection).  Do not wear jewelry, make-up, hairpins, clips or nail polish.  Do not wear lotions, powders, or perfumes.   Do not shave 48 hours prior to surgery.   Contacts and dentures may not be worn into surgery.  Do not bring valuables to the hospital, including drivers license, insurance or credit cards.  [facility] is not responsible for any belongings or valuables.   TAKE THESE MEDICATIONS THE MORNING OF SURGERY:  1.  Carvedilol 2.  Prozac 3.  Levothyroxine  Use CHG Soap or wipes as directed on instruction sheet.  Take 1/2 of usual insulin dose the night before surgery and none on the morning of surgery. Only take 16 units of Lantus the night before surgery and no insulin on the morning of  surgery.  Starting February 17 - Stop aspirin and Anti-inflammatories (NSAIDS) such as Advil, Aleve, Ibuprofen, Motrin, Naproxen, Naprosyn and Aspirin based products such as Excedrin, Goodys Powder, BC Powder. (May take Tylenol or Acetaminophen if needed.)  Starting February 17 - Stop ANY OVER THE COUNTER supplements until after surgery. (May continue Vitamin D.)  Wear comfortable clothing (specific to your surgery type) to the hospital.  Plan for stool softeners for home use.  If you are being admitted to the hospital overnight, leave your suitcase in the car. After surgery it may be brought to your room.  Please call [phone] if you have any questions about these instructions.

## 2018-09-06 LAB — URINE CULTURE: Special Requests: NORMAL

## 2018-09-06 NOTE — Pre-Procedure Instructions (Signed)
HGB AIC FAXED TO DR Ernest Pine

## 2018-09-12 DIAGNOSIS — E875 Hyperkalemia: Secondary | ICD-10-CM | POA: Diagnosis not present

## 2018-09-12 DIAGNOSIS — N183 Chronic kidney disease, stage 3 (moderate): Secondary | ICD-10-CM | POA: Diagnosis not present

## 2018-09-12 DIAGNOSIS — I129 Hypertensive chronic kidney disease with stage 1 through stage 4 chronic kidney disease, or unspecified chronic kidney disease: Secondary | ICD-10-CM | POA: Diagnosis not present

## 2018-09-12 DIAGNOSIS — E1065 Type 1 diabetes mellitus with hyperglycemia: Secondary | ICD-10-CM | POA: Diagnosis not present

## 2018-09-12 DIAGNOSIS — E1122 Type 2 diabetes mellitus with diabetic chronic kidney disease: Secondary | ICD-10-CM | POA: Diagnosis not present

## 2018-09-16 MED ORDER — CEFAZOLIN SODIUM-DEXTROSE 2-4 GM/100ML-% IV SOLN: 2.0000 g | INTRAVENOUS | Status: DC

## 2018-09-16 MED ORDER — TRANEXAMIC ACID-NACL 1000-0.7 MG/100ML-% IV SOLN
1000.0000 mg | INTRAVENOUS | Status: DC
  Filled 2018-09-16: qty 100

## 2018-09-17 ENCOUNTER — Inpatient Hospital Stay
Admission: RE | Admit: 2018-09-17 | Discharge: 2018-09-18 | DRG: 470 | Disposition: A | Discharge status: Home Health | Payer: Medicare Other | Attending: Orthopedic Surgery | Admitting: Orthopedic Surgery

## 2018-09-17 ENCOUNTER — Ambulatory Visit: Payer: Medicare Other | Admitting: Certified Registered Nurse Anesthetist

## 2018-09-17 ENCOUNTER — Encounter: Payer: Self-pay | Admitting: Orthopedic Surgery

## 2018-09-17 ENCOUNTER — Inpatient Hospital Stay: Payer: Medicare Other

## 2018-09-17 ENCOUNTER — Encounter
Admission: RE | Disposition: A | Discharge status: Home Health | Payer: Self-pay | Source: Home / Self Care | Attending: Orthopedic Surgery

## 2018-09-17 ENCOUNTER — Other Ambulatory Visit: Payer: Self-pay

## 2018-09-17 DIAGNOSIS — M25562 Pain in left knee: Secondary | ICD-10-CM | POA: Diagnosis present

## 2018-09-17 DIAGNOSIS — F329 Major depressive disorder, single episode, unspecified: Secondary | ICD-10-CM | POA: Diagnosis not present

## 2018-09-17 DIAGNOSIS — Z885 Allergy status to narcotic agent status: Secondary | ICD-10-CM

## 2018-09-17 DIAGNOSIS — E1022 Type 1 diabetes mellitus with diabetic chronic kidney disease: Secondary | ICD-10-CM | POA: Diagnosis present

## 2018-09-17 DIAGNOSIS — E669 Obesity, unspecified: Secondary | ICD-10-CM | POA: Diagnosis present

## 2018-09-17 DIAGNOSIS — Z7982 Long term (current) use of aspirin: Secondary | ICD-10-CM | POA: Diagnosis not present

## 2018-09-17 DIAGNOSIS — Z6837 Body mass index (BMI) 37.0-37.9, adult: Secondary | ICD-10-CM | POA: Diagnosis not present

## 2018-09-17 DIAGNOSIS — Z79899 Other long term (current) drug therapy: Secondary | ICD-10-CM | POA: Diagnosis not present

## 2018-09-17 DIAGNOSIS — Z794 Long term (current) use of insulin: Secondary | ICD-10-CM

## 2018-09-17 DIAGNOSIS — Z96652 Presence of left artificial knee joint: Secondary | ICD-10-CM

## 2018-09-17 DIAGNOSIS — Z833 Family history of diabetes mellitus: Secondary | ICD-10-CM

## 2018-09-17 DIAGNOSIS — Z8249 Family history of ischemic heart disease and other diseases of the circulatory system: Secondary | ICD-10-CM | POA: Diagnosis not present

## 2018-09-17 DIAGNOSIS — Z96659 Presence of unspecified artificial knee joint: Secondary | ICD-10-CM

## 2018-09-17 DIAGNOSIS — Z471 Aftercare following joint replacement surgery: Secondary | ICD-10-CM | POA: Diagnosis not present

## 2018-09-17 DIAGNOSIS — M1712 Unilateral primary osteoarthritis, left knee: Secondary | ICD-10-CM | POA: Diagnosis not present

## 2018-09-17 DIAGNOSIS — M170 Bilateral primary osteoarthritis of knee: Principal | ICD-10-CM | POA: Diagnosis present

## 2018-09-17 DIAGNOSIS — Z79890 Hormone replacement therapy (postmenopausal): Secondary | ICD-10-CM | POA: Diagnosis not present

## 2018-09-17 DIAGNOSIS — I129 Hypertensive chronic kidney disease with stage 1 through stage 4 chronic kidney disease, or unspecified chronic kidney disease: Secondary | ICD-10-CM | POA: Diagnosis not present

## 2018-09-17 DIAGNOSIS — E1042 Type 1 diabetes mellitus with diabetic polyneuropathy: Secondary | ICD-10-CM | POA: Diagnosis not present

## 2018-09-17 DIAGNOSIS — N183 Chronic kidney disease, stage 3 (moderate): Secondary | ICD-10-CM | POA: Diagnosis not present

## 2018-09-17 DIAGNOSIS — E785 Hyperlipidemia, unspecified: Secondary | ICD-10-CM | POA: Diagnosis not present

## 2018-09-17 HISTORY — PX: TOTAL KNEE ARTHROPLASTY: SHX125

## 2018-09-17 LAB — GLUCOSE, CAPILLARY
Glucose-Capillary: 141 mg/dL — ABNORMAL HIGH (ref 70–99)
Glucose-Capillary: 185 mg/dL — ABNORMAL HIGH (ref 70–99)
Glucose-Capillary: 415 mg/dL — ABNORMAL HIGH (ref 70–99)
Glucose-Capillary: 432 mg/dL — ABNORMAL HIGH (ref 70–99)

## 2018-09-17 LAB — ABO/RH: ABO/RH(D): O POS

## 2018-09-17 LAB — GLUCOSE, RANDOM: Glucose, Bld: 431 mg/dL — ABNORMAL HIGH (ref 70–99)

## 2018-09-17 SURGERY — ARTHROPLASTY, KNEE, TOTAL
Anesthesia: Spinal | Site: Knee | Laterality: Left

## 2018-09-17 MED ORDER — CARVEDILOL 25 MG PO TABS
25.0000 mg | ORAL_TABLET | Freq: Two times a day (BID) | ORAL | Status: DC
  Administered 2018-09-17 – 2018-09-19 (×3): 25 mg via ORAL
  Filled 2018-09-17 (×4): qty 1

## 2018-09-17 MED ORDER — METOCLOPRAMIDE HCL 10 MG PO TABS
10.0000 mg | ORAL_TABLET | Freq: Three times a day (TID) | ORAL | Status: DC
  Administered 2018-09-17 – 2018-09-19 (×7): 10 mg via ORAL
  Filled 2018-09-17 (×7): qty 1

## 2018-09-17 MED ORDER — LEVOTHYROXINE SODIUM 50 MCG PO TABS
150.0000 ug | ORAL_TABLET | Freq: Every day | ORAL | Status: DC
  Administered 2018-09-18 – 2018-09-19 (×2): 150 ug via ORAL
  Filled 2018-09-17 (×2): qty 1

## 2018-09-17 MED ORDER — TRANEXAMIC ACID-NACL 1000-0.7 MG/100ML-% IV SOLN
INTRAVENOUS | Status: DC | PRN
  Administered 2018-09-17: 1000 mg via INTRAVENOUS

## 2018-09-17 MED ORDER — CEFAZOLIN SODIUM-DEXTROSE 2-4 GM/100ML-% IV SOLN
2.0000 g | Freq: Four times a day (QID) | INTRAVENOUS | Status: AC
  Administered 2018-09-17 – 2018-09-18 (×4): 2 g via INTRAVENOUS
  Filled 2018-09-17 (×4): qty 100

## 2018-09-17 MED ORDER — PHENOL 1.4 % MT LIQD
1.0000 | OROMUCOSAL | Status: DC | PRN
  Filled 2018-09-17: qty 177

## 2018-09-17 MED ORDER — BISACODYL 10 MG RE SUPP
10.0000 mg | Freq: Every day | RECTAL | Status: DC | PRN
  Administered 2018-09-19: 10 mg via RECTAL
  Filled 2018-09-17: qty 1

## 2018-09-17 MED ORDER — GABAPENTIN 300 MG PO CAPS
ORAL_CAPSULE | ORAL | Status: AC
  Administered 2018-09-17: 300 mg via ORAL
  Filled 2018-09-17: qty 1

## 2018-09-17 MED ORDER — CELECOXIB 200 MG PO CAPS
ORAL_CAPSULE | ORAL | Status: AC
  Administered 2018-09-17: 400 mg via ORAL
  Filled 2018-09-17: qty 2

## 2018-09-17 MED ORDER — FLEET ENEMA 7-19 GM/118ML RE ENEM
1.0000 | ENEMA | Freq: Once | RECTAL | Status: AC | PRN
  Administered 2018-09-19: 1 via RECTAL

## 2018-09-17 MED ORDER — ACETAMINOPHEN 325 MG PO TABS: 325.0000 mg | ORAL_TABLET | Freq: Four times a day (QID) | ORAL | Status: DC | PRN

## 2018-09-17 MED ORDER — FAMOTIDINE 20 MG PO TABS
20.0000 mg | ORAL_TABLET | Freq: Once | ORAL | Status: AC
  Administered 2018-09-17: 20 mg via ORAL

## 2018-09-17 MED ORDER — DEXAMETHASONE SODIUM PHOSPHATE 10 MG/ML IJ SOLN
8.0000 mg | Freq: Once | INTRAMUSCULAR | Status: AC
  Administered 2018-09-17: 8 mg via INTRAVENOUS

## 2018-09-17 MED ORDER — DIPHENHYDRAMINE HCL 12.5 MG/5ML PO ELIX: 12.5000 mg | ORAL_SOLUTION | ORAL | Status: DC | PRN

## 2018-09-17 MED ORDER — OXYCODONE HCL 5 MG PO TABS: 10.0000 mg | ORAL_TABLET | ORAL | Status: DC | PRN

## 2018-09-17 MED ORDER — FUROSEMIDE 20 MG PO TABS
20.0000 mg | ORAL_TABLET | Freq: Every day | ORAL | Status: DC
  Administered 2018-09-18 – 2018-09-19 (×2): 20 mg via ORAL
  Filled 2018-09-17 (×3): qty 1

## 2018-09-17 MED ORDER — HYDROMORPHONE HCL 1 MG/ML IJ SOLN: 0.5000 mg | INTRAMUSCULAR | Status: DC | PRN

## 2018-09-17 MED ORDER — LIDOCAINE HCL (PF) 2 % IJ SOLN
INTRAMUSCULAR | Status: AC
  Filled 2018-09-17: qty 10

## 2018-09-17 MED ORDER — BUPIVACAINE HCL (PF) 0.25 % IJ SOLN
INTRAMUSCULAR | Status: DC | PRN
  Administered 2018-09-17: 60 mL

## 2018-09-17 MED ORDER — MIDAZOLAM HCL 2 MG/2ML IJ SOLN
INTRAMUSCULAR | Status: AC
  Filled 2018-09-17: qty 2

## 2018-09-17 MED ORDER — BUPIVACAINE HCL (PF) 0.5 % IJ SOLN
INTRAMUSCULAR | Status: DC | PRN
  Administered 2018-09-17: 2.5 mL

## 2018-09-17 MED ORDER — TRANEXAMIC ACID-NACL 1000-0.7 MG/100ML-% IV SOLN
1000.0000 mg | Freq: Once | INTRAVENOUS | Status: AC
  Administered 2018-09-17: 1000 mg via INTRAVENOUS
  Filled 2018-09-17: qty 100

## 2018-09-17 MED ORDER — VITAMIN D (ERGOCALCIFEROL) 1.25 MG (50000 UNIT) PO CAPS: 50000.0000 [IU] | ORAL_CAPSULE | ORAL | Status: DC

## 2018-09-17 MED ORDER — INSULIN GLARGINE 100 UNIT/ML ~~LOC~~ SOLN
32.0000 [IU] | Freq: Every day | SUBCUTANEOUS | Status: DC
  Administered 2018-09-17 – 2018-09-18 (×2): 32 [IU] via SUBCUTANEOUS
  Filled 2018-09-17 (×4): qty 0.32

## 2018-09-17 MED ORDER — SODIUM CHLORIDE 0.9 % IV SOLN
INTRAVENOUS | Status: DC
  Administered 2018-09-17 (×2): via INTRAVENOUS

## 2018-09-17 MED ORDER — SODIUM CHLORIDE 0.9 % IV SOLN
INTRAVENOUS | Status: DC | PRN
  Administered 2018-09-17: 60 mL

## 2018-09-17 MED ORDER — FENTANYL CITRATE (PF) 100 MCG/2ML IJ SOLN
INTRAMUSCULAR | Status: DC | PRN
  Administered 2018-09-17: 100 ug via INTRAVENOUS

## 2018-09-17 MED ORDER — SODIUM CHLORIDE 0.9 % IV SOLN
INTRAVENOUS | Status: DC | PRN
  Administered 2018-09-17: 30 ug/min via INTRAVENOUS

## 2018-09-17 MED ORDER — DEXAMETHASONE SODIUM PHOSPHATE 10 MG/ML IJ SOLN
INTRAMUSCULAR | Status: AC
  Administered 2018-09-17: 8 mg via INTRAVENOUS
  Filled 2018-09-17: qty 1

## 2018-09-17 MED ORDER — METOCLOPRAMIDE HCL 5 MG/ML IJ SOLN: 5.0000 mg | Freq: Three times a day (TID) | INTRAMUSCULAR | Status: DC | PRN

## 2018-09-17 MED ORDER — INSULIN ASPART 100 UNIT/ML ~~LOC~~ SOLN: 0.0000 [IU] | Freq: Three times a day (TID) | SUBCUTANEOUS | Status: DC

## 2018-09-17 MED ORDER — INSULIN ASPART 100 UNIT/ML ~~LOC~~ SOLN
4.0000 [IU] | Freq: Two times a day (BID) | SUBCUTANEOUS | Status: DC
  Administered 2018-09-18 (×2): 4 [IU] via SUBCUTANEOUS
  Filled 2018-09-17 (×2): qty 1

## 2018-09-17 MED ORDER — ACETAMINOPHEN 10 MG/ML IV SOLN
1000.0000 mg | Freq: Four times a day (QID) | INTRAVENOUS | Status: AC
  Administered 2018-09-17 – 2018-09-18 (×4): 1000 mg via INTRAVENOUS
  Filled 2018-09-17 (×4): qty 100

## 2018-09-17 MED ORDER — SODIUM CHLORIDE 0.9 % IV SOLN
INTRAVENOUS | Status: DC
  Administered 2018-09-17 – 2018-09-18 (×2): via INTRAVENOUS

## 2018-09-17 MED ORDER — ACETAMINOPHEN 10 MG/ML IV SOLN
INTRAVENOUS | Status: DC | PRN
  Administered 2018-09-17: 1000 mg via INTRAVENOUS

## 2018-09-17 MED ORDER — METOCLOPRAMIDE HCL 10 MG PO TABS: 5.0000 mg | ORAL_TABLET | Freq: Three times a day (TID) | ORAL | Status: DC | PRN

## 2018-09-17 MED ORDER — TETRACAINE HCL 1 % IJ SOLN
INTRAMUSCULAR | Status: DC | PRN
  Administered 2018-09-17: 5 mg via INTRASPINAL

## 2018-09-17 MED ORDER — ACETAMINOPHEN 10 MG/ML IV SOLN
INTRAVENOUS | Status: AC
  Filled 2018-09-17: qty 100

## 2018-09-17 MED ORDER — SENNOSIDES-DOCUSATE SODIUM 8.6-50 MG PO TABS
1.0000 | ORAL_TABLET | Freq: Two times a day (BID) | ORAL | Status: DC
  Administered 2018-09-17 – 2018-09-19 (×4): 1 via ORAL
  Filled 2018-09-17 (×4): qty 1

## 2018-09-17 MED ORDER — QUINAPRIL HCL 10 MG PO TABS
20.0000 mg | ORAL_TABLET | Freq: Every day | ORAL | Status: DC
  Administered 2018-09-18 – 2018-09-19 (×2): 20 mg via ORAL
  Filled 2018-09-17 (×3): qty 2

## 2018-09-17 MED ORDER — MAGNESIUM HYDROXIDE 400 MG/5ML PO SUSP
30.0000 mL | Freq: Every day | ORAL | Status: DC
  Administered 2018-09-18 – 2018-09-19 (×2): 30 mL via ORAL
  Filled 2018-09-17 (×2): qty 30

## 2018-09-17 MED ORDER — PROPOFOL 500 MG/50ML IV EMUL
INTRAVENOUS | Status: AC
  Filled 2018-09-17: qty 50

## 2018-09-17 MED ORDER — INSULIN ASPART 100 UNIT/ML ~~LOC~~ SOLN
0.0000 [IU] | Freq: Every day | SUBCUTANEOUS | Status: DC
  Administered 2018-09-17: 5 [IU] via SUBCUTANEOUS
  Administered 2018-09-18: 2 [IU] via SUBCUTANEOUS
  Filled 2018-09-17 (×2): qty 1

## 2018-09-17 MED ORDER — ONDANSETRON HCL 4 MG PO TABS: 4.0000 mg | ORAL_TABLET | Freq: Four times a day (QID) | ORAL | Status: DC | PRN

## 2018-09-17 MED ORDER — INSULIN ASPART 100 UNIT/ML ~~LOC~~ SOLN
0.0000 [IU] | Freq: Three times a day (TID) | SUBCUTANEOUS | Status: DC
  Administered 2018-09-18: 7 [IU] via SUBCUTANEOUS
  Administered 2018-09-18: 2 [IU] via SUBCUTANEOUS
  Administered 2018-09-18: 7 [IU] via SUBCUTANEOUS
  Administered 2018-09-19: 2 [IU] via SUBCUTANEOUS
  Administered 2018-09-19: 1 [IU] via SUBCUTANEOUS
  Filled 2018-09-17 (×5): qty 1

## 2018-09-17 MED ORDER — ALUM & MAG HYDROXIDE-SIMETH 200-200-20 MG/5ML PO SUSP: 30.0000 mL | ORAL | Status: DC | PRN

## 2018-09-17 MED ORDER — TRAMADOL HCL 50 MG PO TABS
50.0000 mg | ORAL_TABLET | ORAL | Status: DC | PRN
  Administered 2018-09-17 – 2018-09-19 (×7): 100 mg via ORAL
  Filled 2018-09-17 (×7): qty 2

## 2018-09-17 MED ORDER — FENTANYL CITRATE (PF) 100 MCG/2ML IJ SOLN
INTRAMUSCULAR | Status: AC
  Filled 2018-09-17: qty 2

## 2018-09-17 MED ORDER — FAMOTIDINE 20 MG PO TABS
ORAL_TABLET | ORAL | Status: AC
  Administered 2018-09-17: 20 mg via ORAL
  Filled 2018-09-17: qty 1

## 2018-09-17 MED ORDER — PROPOFOL 500 MG/50ML IV EMUL
INTRAVENOUS | Status: DC | PRN
  Administered 2018-09-17: 50 ug/kg/min via INTRAVENOUS

## 2018-09-17 MED ORDER — GLYCOPYRROLATE 0.2 MG/ML IJ SOLN
INTRAMUSCULAR | Status: AC
  Filled 2018-09-17: qty 1

## 2018-09-17 MED ORDER — CEFAZOLIN SODIUM-DEXTROSE 2-3 GM-%(50ML) IV SOLR
INTRAVENOUS | Status: DC | PRN
  Administered 2018-09-17: 2 g via INTRAVENOUS

## 2018-09-17 MED ORDER — MIDAZOLAM HCL 5 MG/5ML IJ SOLN
INTRAMUSCULAR | Status: DC | PRN
  Administered 2018-09-17: 1 mg via INTRAVENOUS

## 2018-09-17 MED ORDER — SODIUM CHLORIDE 0.9 % IV SOLN
INTRAVENOUS | Status: DC | PRN
  Administered 2018-09-17: 12:00:00

## 2018-09-17 MED ORDER — CHLORHEXIDINE GLUCONATE 4 % EX LIQD: 60.0000 mL | Freq: Once | CUTANEOUS | Status: DC

## 2018-09-17 MED ORDER — FENTANYL CITRATE (PF) 100 MCG/2ML IJ SOLN
INTRAMUSCULAR | Status: AC
  Administered 2018-09-17: 25 ug via INTRAVENOUS
  Filled 2018-09-17: qty 2

## 2018-09-17 MED ORDER — CELECOXIB 200 MG PO CAPS
400.0000 mg | ORAL_CAPSULE | Freq: Once | ORAL | Status: AC
  Administered 2018-09-17: 400 mg via ORAL

## 2018-09-17 MED ORDER — FLUOXETINE HCL 20 MG PO CAPS
20.0000 mg | ORAL_CAPSULE | Freq: Every day | ORAL | Status: DC
  Administered 2018-09-18 – 2018-09-19 (×2): 20 mg via ORAL
  Filled 2018-09-17 (×2): qty 1

## 2018-09-17 MED ORDER — ENOXAPARIN SODIUM 30 MG/0.3ML ~~LOC~~ SOLN
30.0000 mg | Freq: Two times a day (BID) | SUBCUTANEOUS | Status: DC
  Administered 2018-09-18 – 2018-09-19 (×3): 30 mg via SUBCUTANEOUS
  Filled 2018-09-17 (×3): qty 0.3

## 2018-09-17 MED ORDER — OXYCODONE HCL 5 MG PO TABS: 5.0000 mg | ORAL_TABLET | ORAL | Status: DC | PRN

## 2018-09-17 MED ORDER — CELECOXIB 200 MG PO CAPS
200.0000 mg | ORAL_CAPSULE | Freq: Two times a day (BID) | ORAL | Status: DC
  Administered 2018-09-17 – 2018-09-19 (×4): 200 mg via ORAL
  Filled 2018-09-17 (×4): qty 1

## 2018-09-17 MED ORDER — GABAPENTIN 300 MG PO CAPS
300.0000 mg | ORAL_CAPSULE | Freq: Every day | ORAL | Status: DC
  Administered 2018-09-17 – 2018-09-18 (×2): 300 mg via ORAL
  Filled 2018-09-17 (×2): qty 1

## 2018-09-17 MED ORDER — MENTHOL 3 MG MT LOZG
1.0000 | LOZENGE | OROMUCOSAL | Status: DC | PRN
  Filled 2018-09-17: qty 9

## 2018-09-17 MED ORDER — PROPOFOL 10 MG/ML IV BOLUS
INTRAVENOUS | Status: DC | PRN
  Administered 2018-09-17: 30 mg via INTRAVENOUS

## 2018-09-17 MED ORDER — ATORVASTATIN CALCIUM 10 MG PO TABS
10.0000 mg | ORAL_TABLET | Freq: Every day | ORAL | Status: DC
  Administered 2018-09-17 – 2018-09-18 (×2): 10 mg via ORAL
  Filled 2018-09-17 (×2): qty 1

## 2018-09-17 MED ORDER — GLYCOPYRROLATE 0.2 MG/ML IJ SOLN
INTRAMUSCULAR | Status: DC | PRN
  Administered 2018-09-17: 0.2 mg via INTRAVENOUS

## 2018-09-17 MED ORDER — ONDANSETRON HCL 4 MG/2ML IJ SOLN: 4.0000 mg | Freq: Four times a day (QID) | INTRAMUSCULAR | Status: DC | PRN

## 2018-09-17 MED ORDER — PANTOPRAZOLE SODIUM 40 MG PO TBEC
40.0000 mg | DELAYED_RELEASE_TABLET | Freq: Two times a day (BID) | ORAL | Status: DC
  Administered 2018-09-17 – 2018-09-19 (×4): 40 mg via ORAL
  Filled 2018-09-17 (×4): qty 1

## 2018-09-17 MED ORDER — CEFAZOLIN SODIUM-DEXTROSE 2-4 GM/100ML-% IV SOLN
INTRAVENOUS | Status: AC
  Filled 2018-09-17: qty 100

## 2018-09-17 MED ORDER — ONDANSETRON HCL 4 MG/2ML IJ SOLN: 4.0000 mg | Freq: Once | INTRAMUSCULAR | Status: DC | PRN

## 2018-09-17 MED ORDER — FENTANYL CITRATE (PF) 100 MCG/2ML IJ SOLN
25.0000 ug | INTRAMUSCULAR | Status: DC | PRN
  Administered 2018-09-17 (×2): 25 ug via INTRAVENOUS

## 2018-09-17 MED ORDER — GABAPENTIN 300 MG PO CAPS
300.0000 mg | ORAL_CAPSULE | Freq: Once | ORAL | Status: AC
  Administered 2018-09-17: 300 mg via ORAL

## 2018-09-17 MED ORDER — FERROUS SULFATE 325 (65 FE) MG PO TABS
325.0000 mg | ORAL_TABLET | Freq: Two times a day (BID) | ORAL | Status: DC
  Administered 2018-09-17 – 2018-09-19 (×4): 325 mg via ORAL
  Filled 2018-09-17 (×4): qty 1

## 2018-09-17 SURGICAL SUPPLY — 75 items
ATTUNE PSFEM LTSZ5 NARCEM KNEE (Femur) ×1 IMPLANT
ATTUNE PSRP INSE SZ5 7 KNEE (Insert) ×1 IMPLANT
BASE TIBIAL ROT PLAT SZ 3 KNEE (Knees) IMPLANT
BATTERY INSTRU NAVIGATION (MISCELLANEOUS) ×8 IMPLANT
BLADE SAW 70X12.5 (BLADE) ×2 IMPLANT
BLADE SAW 90X13X1.19 OSCILLAT (BLADE) ×2 IMPLANT
BLADE SAW 90X25X1.19 OSCILLAT (BLADE) ×2 IMPLANT
BONE CEMENT GENTAMICIN (Cement) ×4 IMPLANT
CANISTER SUCT 1200ML W/VALVE (MISCELLANEOUS) ×2 IMPLANT
CANISTER SUCT 3000ML PPV (MISCELLANEOUS) ×4 IMPLANT
CEMENT BONE GENTAMICIN 40 (Cement) IMPLANT
COOLER POLAR GLACIER W/PUMP (MISCELLANEOUS) ×2 IMPLANT
COVER WAND RF STERILE (DRAPES) ×1 IMPLANT
CUFF TOURN 24 STER (MISCELLANEOUS) IMPLANT
CUFF TOURN 30 STER DUAL PORT (MISCELLANEOUS) IMPLANT
CUFF TOURN 34 STER (MISCELLANEOUS) ×1 IMPLANT
DRAPE SHEET LG 3/4 BI-LAMINATE (DRAPES) ×2 IMPLANT
DRSG DERMACEA 8X12 NADH (GAUZE/BANDAGES/DRESSINGS) ×2 IMPLANT
DRSG OPSITE POSTOP 4X14 (GAUZE/BANDAGES/DRESSINGS) ×2 IMPLANT
DRSG TEGADERM 4X4.75 (GAUZE/BANDAGES/DRESSINGS) ×2 IMPLANT
DURAPREP 26ML APPLICATOR (WOUND CARE) ×4 IMPLANT
ELECT CAUTERY BLADE 6.4 (BLADE) ×2 IMPLANT
ELECT REM PT RETURN 9FT ADLT (ELECTROSURGICAL) ×2
ELECTRODE REM PT RTRN 9FT ADLT (ELECTROSURGICAL) ×1 IMPLANT
EX-PIN ORTHOLOCK NAV 4X150 (PIN) ×4 IMPLANT
GLOVE BIOGEL M STRL SZ7.5 (GLOVE) ×4 IMPLANT
GLOVE BIOGEL PI IND STRL 7.5 (GLOVE) IMPLANT
GLOVE BIOGEL PI INDICATOR 7.5 (GLOVE) ×6
GLOVE INDICATOR 8.0 STRL GRN (GLOVE) ×2 IMPLANT
GOWN STRL REUS W/ TWL LRG LVL3 (GOWN DISPOSABLE) ×2 IMPLANT
GOWN STRL REUS W/TWL LRG LVL3 (GOWN DISPOSABLE) ×2
HEMOVAC 400CC 10FR (MISCELLANEOUS) ×2 IMPLANT
HOLDER FOLEY CATH W/STRAP (MISCELLANEOUS) ×2 IMPLANT
HOOD PEEL AWAY FLYTE STAYCOOL (MISCELLANEOUS) ×4 IMPLANT
KIT TURNOVER KIT A (KITS) ×2 IMPLANT
KNIFE SCULPS 14X20 (INSTRUMENTS) ×2 IMPLANT
LABEL OR SOLS (LABEL) ×2 IMPLANT
MANIFOLD NEPTUNE WASTE (CANNULA) ×2 IMPLANT
NDL SAFETY ECLIPSE 18X1.5 (NEEDLE) ×1 IMPLANT
NDL SPNL 20GX3.5 QUINCKE YW (NEEDLE) ×2 IMPLANT
NEEDLE HYPO 18GX1.5 SHARP (NEEDLE) ×1
NEEDLE SPNL 20GX3.5 QUINCKE YW (NEEDLE) ×4 IMPLANT
NS IRRIG 500ML POUR BTL (IV SOLUTION) ×2 IMPLANT
PACK TOTAL KNEE (MISCELLANEOUS) ×2 IMPLANT
PAD WRAPON POLAR KNEE (MISCELLANEOUS) ×1 IMPLANT
PAD WRAPON POLOR MULTI XL (MISCELLANEOUS) IMPLANT
PATELLA MEDIAL ATTUN 35MM KNEE (Knees) ×1 IMPLANT
PENCIL SMOKE ULTRAEVAC 22 CON (MISCELLANEOUS) ×2 IMPLANT
PIN DRILL QUICK PACK ×2 IMPLANT
PIN FIXATION 1/8DIA X 3INL (PIN) ×6 IMPLANT
PULSAVAC PLUS IRRIG FAN TIP (DISPOSABLE) ×2
SOL .9 NS 3000ML IRR  AL (IV SOLUTION) ×1
SOL .9 NS 3000ML IRR UROMATIC (IV SOLUTION) ×1 IMPLANT
SOL PREP PVP 2OZ (MISCELLANEOUS) ×2
SOLUTION PREP PVP 2OZ (MISCELLANEOUS) ×1 IMPLANT
SPONGE DRAIN TRACH 4X4 STRL 2S (GAUZE/BANDAGES/DRESSINGS) ×2 IMPLANT
SPONGE LAP 18X18 RF (DISPOSABLE) ×1 IMPLANT
STAPLER SKIN PROX 35W (STAPLE) ×2 IMPLANT
STOCKINETTE IMPERV 14X48 (MISCELLANEOUS) ×1 IMPLANT
STRAP TIBIA SHORT (MISCELLANEOUS) ×2 IMPLANT
SUCTION FRAZIER HANDLE 10FR (MISCELLANEOUS) ×1
SUCTION TUBE FRAZIER 10FR DISP (MISCELLANEOUS) ×1 IMPLANT
SUT VIC AB 0 CT1 36 (SUTURE) ×2 IMPLANT
SUT VIC AB 1 CT1 36 (SUTURE) ×4 IMPLANT
SUT VIC AB 2-0 CT2 27 (SUTURE) ×2 IMPLANT
SYR 20CC LL (SYRINGE) ×2 IMPLANT
SYR 30ML LL (SYRINGE) ×4 IMPLANT
TIBIAL BASE ROT PLAT SZ 3 KNEE (Knees) ×2 IMPLANT
TIP FAN IRRIG PULSAVAC PLUS (DISPOSABLE) ×1 IMPLANT
TOWEL OR 17X26 4PK STRL BLUE (TOWEL DISPOSABLE) ×2 IMPLANT
TOWER CARTRIDGE SMART MIX (DISPOSABLE) ×2 IMPLANT
TRAY FOLEY MTR SLVR 16FR STAT (SET/KITS/TRAYS/PACK) ×2 IMPLANT
WRAP-ON POLOR PAD MULTI XL (MISCELLANEOUS) ×1
WRAPON POLAR PAD KNEE (MISCELLANEOUS)
WRAPON POLOR PAD MULTI XL (MISCELLANEOUS) ×1

## 2018-09-17 NOTE — H&P (Signed)
The patient has been re-examined, and the chart reviewed, and there have been no interval changes to the documented history and physical.    The risks, benefits, and alternatives have been discussed at length. The patient expressed understanding of the risks benefits and agreed with plans for surgical intervention.  Nikiya Starn P. Sequoia Mincey, Jr. M.D.    

## 2018-09-17 NOTE — Anesthesia Post-op Follow-up Note (Signed)
Anesthesia QCDR form completed.        

## 2018-09-17 NOTE — Op Note (Signed)
OPERATIVE NOTE  DATE OF SURGERY:  09/17/2018  PATIENT NAME:  Mary [name]   DOB: 13-Oct-1945  MRN: [id number]  PRE-OPERATIVE DIAGNOSIS: Degenerative arthrosis of the left knee, primary  POST-OPERATIVE DIAGNOSIS:  Same  PROCEDURE:  Left total knee arthroplasty using computer-assisted navigation  SURGEON:  Jena Gauss. M.D.  ASSISTANT: Lewis Moccasin, PA-C (present and scrubbed throughout the case, critical for assistance with exposure, retraction, instrumentation, and closure)  ANESTHESIA: spinal  ESTIMATED BLOOD LOSS: 150 mL  FLUIDS REPLACED: 1800 mL of crystalloid  TOURNIQUET TIME:#1 -11 minutes   #2 -24 minutes  DRAINS: 2 medium Hemovac drains  SOFT TISSUE RELEASES: Anterior cruciate ligament, posterior cruciate ligament, deep medial collateral ligament, patellofemoral ligament  IMPLANTS UTILIZED: DePuy Attune size 5N posterior stabilized femoral component (cemented), size 3 rotating platform tibial component (cemented), 35 mm medialized dome patella (cemented), and a 7 mm stabilized rotating platform polyethylene insert.  INDICATIONS FOR SURGERY: Mary [name] is a 73 y.o. year old female with a long history of progressive knee pain. X-rays demonstrated severe degenerative changes in tricompartmental fashion. The patient had not seen any significant improvement despite conservative nonsurgical intervention. After discussion of the risks and benefits of surgical intervention, the patient expressed understanding of the risks benefits and agree with plans for total knee arthroplasty.   The risks, benefits, and alternatives were discussed at length including but not limited to the risks of infection, bleeding, nerve injury, stiffness, blood clots, the need for revision surgery, cardiopulmonary complications, among others, and they were willing to proceed.  PROCEDURE IN DETAIL: The patient was brought into the operating room and, after adequate spinal anesthesia was achieved, a  tourniquet was placed on the patient's upper thigh. The patient's knee and leg were cleaned and prepped with alcohol and DuraPrep and draped in the usual sterile fashion. A "timeout" was performed as per usual protocol. The lower extremity was exsanguinated using an Esmarch, and the tourniquet was inflated to 300 mmHg. An anterior longitudinal incision was made followed by a standard mid vastus approach. The deep fibers of the medial collateral ligament were elevated in a subperiosteal fashion off of the medial flare of the tibia so as to maintain a continuous soft tissue sleeve. The patella was subluxed laterally and the patellofemoral ligament was incised. Inspection of the knee demonstrated severe degenerative changes with full-thickness loss of articular cartilage. Osteophytes were debrided using a rongeur. Anterior and posterior cruciate ligaments were excised.  Given the amount of bleeding, it was felt that there was a venous tourniquet in place so the tourniquet was deflated after initial tourniquet time of 11 minutes.  Two 4.0 mm Schanz pins were inserted in the femur and into the tibia for attachment of the array of trackers used for computer-assisted navigation. Hip center was identified using a circumduction technique. Distal landmarks were mapped using the computer. The distal femur and proximal tibia were mapped using the computer. The distal femoral cutting guide was positioned using computer-assisted navigation so as to achieve a 5 distal valgus cut. The femur was sized and it was felt that a size 5N femoral component was appropriate. A size 5 femoral cutting guide was positioned and the anterior cut was performed and verified using the computer. This was followed by completion of the posterior and chamfer cuts. Femoral cutting guide for the central box was then positioned in the center box cut was performed.  Attention was then directed to the proximal tibia. Medial and lateral menisci were  excised.  The extramedullary tibial cutting guide was positioned using computer-assisted navigation so as to achieve a 0 varus-valgus alignment and 3 posterior slope. The cut was performed and verified using the computer. The proximal tibia was sized and it was felt that a size 3 tibial tray was appropriate. Tibial and femoral trials were inserted followed by insertion of a 7 mm polyethylene insert. This allowed for excellent mediolateral soft tissue balancing both in flexion and in full extension. Finally, the patella was cut and prepared so as to accommodate a 35 mm medialized dome patella. A patella trial was placed and the knee was placed through a range of motion with excellent patellar tracking appreciated.  The left lower extremity was exsanguinated second time using the Esmarch and the tourniquet was inflated to 350 mmHg.  The femoral trial was removed after debridement of posterior osteophytes. The central post-hole for the tibial component was reamed followed by insertion of a keel punch. Tibial trials were then removed. Cut surfaces of bone were irrigated with copious amounts of normal saline with antibiotic solution using pulsatile lavage and then suctioned dry. Polymethylmethacrylate cement with gentamicin was prepared in the usual fashion using a vacuum mixer. Cement was applied to the cut surface of the proximal tibia as well as along the undersurface of a size 3 rotating platform tibial component. Tibial component was positioned and impacted into place. Excess cement was removed using Personal assistant. Cement was then applied to the cut surfaces of the femur as well as along the posterior flanges of the size 5N femoral component. The femoral component was positioned and impacted into place. Excess cement was removed using Personal assistant. A 7 mm polyethylene trial was inserted and the knee was brought into full extension with steady axial compression applied. Finally, cement was applied to the  backside of a 35 mm medialized dome patella and the patellar component was positioned and patellar clamp applied. Excess cement was removed using Personal assistant. After adequate curing of the cement, the tourniquet was deflated after a second tourniquet time of 24 minutes. Hemostasis was achieved using electrocautery. The knee was irrigated with copious amounts of normal saline with antibiotic solution using pulsatile lavage and then suctioned dry. 20 mL of 1.3% Exparel and 60 mL of 0.25% Marcaine in 40 mL of normal saline was injected along the posterior capsule, medial and lateral gutters, and along the arthrotomy site. A 7 mm stabilized rotating platform polyethylene insert was inserted and the knee was placed through a range of motion with excellent mediolateral soft tissue balancing appreciated and excellent patellar tracking noted. 2 medium drains were placed in the wound bed and brought out through separate stab incisions. The medial parapatellar portion of the incision was reapproximated using interrupted sutures of #1 Vicryl. Subcutaneous tissue was approximated in layers using first #0 Vicryl followed #2-0 Vicryl. The skin was approximated with skin staples. A sterile dressing was applied.  The patient tolerated the procedure well and was transported to the recovery room in stable condition.    James P. Angie Fava., M.D.

## 2018-09-17 NOTE — Anesthesia Procedure Notes (Signed)
Spinal  Patient location during procedure: OR Start time: 09/17/2018 11:17 AM End time: 09/17/2018 11:25 AM Staffing Resident/CRNA: Bernardo Heater, CRNA Performed: resident/CRNA  Preanesthetic Checklist Completed: patient identified, site marked, surgical consent, pre-op evaluation, timeout performed, IV checked, risks and benefits discussed and monitors and equipment checked Spinal Block Patient position: sitting Prep: Betadine and ChloraPrep Patient monitoring: heart rate, continuous pulse ox, blood pressure and cardiac monitor Approach: left paramedian Location: L3-4 Injection technique: single-shot Needle Needle type: Whitacre and Introducer  Needle gauge: 22 G Needle length: 12.7 cm Additional Notes Negative paresthesia. Negative blood return. Positive free-flowing CSF. Expiration date of kit checked and confirmed. Patient tolerated procedure well, without complications.

## 2018-09-17 NOTE — Progress Notes (Signed)
Inpatient Diabetes Program Recommendations  AACE/ADA: New Consensus Statement on Inpatient Glycemic Control (2015)  Target Ranges:  Prepandial:   less than 140 mg/dL      Peak postprandial:   less than 180 mg/dL (1-2 hours)      Critically ill patients:  140 - 180 mg/dL   Lab Results  Component Value Date   GLUCAP 141 (H) 09/17/2018   HGBA1C 7.4 (H) 09/05/2018    Review of Glycemic Control  Results for CHITARA, PITMON (MRN [id number]) as of 09/17/2018 13:11  Ref. Range 09/17/2018 10:15  Glucose-Capillary Latest Ref Range: 70 - 99 mg/dL 453 (H)   Diabetes history: Type 1 DM- see's Dr. Tedd Sias Outpatient Diabetes medications:  Lantus 32 units q HS, Novolog 4 units tid with meals Accuchek "Guide Me" meter- 12A- goal-110, I:C-12, Sensitivity-30  12P-goal-110, I:C-6, Sensitivity-25    5P-goal-110, I:C-9, Sensitivity-25 Inpatient Diabetes Program Recommendations:    Note patient has history of Type 1 DM (makes no insulin).  While in the hospital, she will need basal/bolus insulin.   1) Please add Lantus 30 units q HS 2) Novolog sensitive correction tid with meals and HS 3) Novolog meal coverage 4 units tid with meals (hold if patient eats less than 50%).   Thanks,  Beryl Meager, RN, BC-ADM Inpatient Diabetes Coordinator Pager [phone] (8a-5p)

## 2018-09-17 NOTE — Transfer of Care (Signed)
Immediate Anesthesia Transfer of Care Note  Patient: Mary [name]  Procedure(s) Performed: LEFT TOTAL KNEE ARTHROPLASTY (Left Knee)  Patient Location: PACU  Anesthesia Type:Spinal  Level of Consciousness: awake and patient cooperative  Airway & Oxygen Therapy: Patient Spontanous Breathing and Patient connected to nasal cannula oxygen  Post-op Assessment: Report given to RN and Post -op Vital signs reviewed and stable  Post vital signs: Reviewed and stable  Last Vitals:  Vitals Value Taken Time  BP 134/60 09/17/2018  3:08 PM  Temp 36.3 C 09/17/2018  3:07 PM  Pulse 69 09/17/2018  3:08 PM  Resp 15 09/17/2018  3:08 PM  SpO2 95 % 09/17/2018  3:08 PM  Vitals shown include unvalidated device data.  Last Pain:  Vitals:   09/17/18 1018  TempSrc: Tympanic  PainSc: 0-No pain         Complications: No apparent anesthesia complications

## 2018-09-18 ENCOUNTER — Encounter: Payer: Self-pay | Admitting: Orthopedic Surgery

## 2018-09-18 LAB — GLUCOSE, CAPILLARY
Glucose-Capillary: 347 mg/dL — ABNORMAL HIGH (ref 70–99)
Glucose-Capillary: 343 mg/dL — ABNORMAL HIGH (ref 70–99)
Glucose-Capillary: 183 mg/dL — ABNORMAL HIGH (ref 70–99)
Glucose-Capillary: 239 mg/dL — ABNORMAL HIGH (ref 70–99)

## 2018-09-18 MED ORDER — OXYCODONE HCL 5 MG PO TABS: 5.0000 mg | ORAL_TABLET | ORAL | 0 refills | Status: DC | PRN

## 2018-09-18 MED ORDER — TRAMADOL HCL 50 MG PO TABS: 50.0000 mg | ORAL_TABLET | Freq: Four times a day (QID) | ORAL | 0 refills | Status: DC | PRN

## 2018-09-18 MED ORDER — ENOXAPARIN SODIUM 40 MG/0.4ML ~~LOC~~ SOLN: 40.0000 mg | SUBCUTANEOUS | 0 refills | Status: DC

## 2018-09-18 NOTE — Progress Notes (Signed)
Clinical Social Worker (CSW) received SNF consult. PT is recommending home health. RN case manager aware of above. Please reconsult if future social work needs arise. CSW signing off.   Matthias Bogus, LCSW [phone] 

## 2018-09-18 NOTE — [place] FL2 (Signed)
[place] MEDICAID FL2 LEVEL OF CARE SCREENING TOOL     IDENTIFICATION  Patient Name: Mary [name] Birthdate: Sep 06, 1945 Sex: female Admission Date (Current Location): 09/17/2018  [place] and [place] Number:  Chiropodist and Address:  [facility], [address] [place], [place] [address]      Provider Number: [id number]  Attending Physician Name and Address:  Donato Heinz, MD  Relative Name and Phone Number:       Current Level of Care: Hospital Recommended Level of Care: Skilled Nursing Facility Prior Approval Number:    Date Approved/Denied:   PASRR Number: [id number])  Discharge Plan: SNF    Current Diagnoses: Patient Active Problem List   Diagnosis Date Noted  . Total knee replacement status 09/17/2018  . Primary osteoarthritis of both knees 07/01/2018  . Chronic kidney disease (CKD) stage G3a/A1, moderately decreased glomerular filtration rate (GFR) between 45-59 mL/min/1.73 square meter and albuminuria creatinine ratio less than 30 mg/g (HCC) 04/08/2015  . Hypothyroidism 04/07/2015  . Obesity 04/07/2015  . Hyperlipidemia 04/07/2015  . Depression, major 04/07/2015  . Claustrophobia 04/07/2015  . Hypertension 04/07/2015  . Diabetes mellitus type 1, uncontrolled (HCC) 09/30/2014    Orientation RESPIRATION BLADDER Height & Weight     Self, Time, Situation, Place  Normal Continent Weight: 217 lb (98.4 kg) Height:  5\' 4"  (162.6 cm)  BEHAVIORAL SYMPTOMS/MOOD NEUROLOGICAL BOWEL NUTRITION STATUS      Continent Diet(Diet: Carb Modified. )  AMBULATORY STATUS COMMUNICATION OF NEEDS Skin   Extensive Assist Verbally Surgical wounds(Incision: Left Knee. )                       Personal Care Assistance Level of Assistance  Bathing, Feeding, Dressing Bathing Assistance: Limited assistance Feeding assistance: Independent Dressing Assistance: Limited assistance     Functional Limitations Info  Sight, Hearing, Speech  Sight Info: Adequate Hearing Info: Adequate Speech Info: Adequate    SPECIAL CARE FACTORS FREQUENCY  PT (By licensed PT), OT (By licensed OT)     PT Frequency: (5) OT Frequency: (5)            Contractures      Additional Factors Info  Code Status, Allergies Code Status Info: (Full Code. ) Allergies Info: (Oxycodone Hcl, Hydrocodone)           Current Medications (09/18/2018):  This is the current hospital active medication list Current Facility-Administered Medications  Medication Dose Route Frequency Provider Last Rate Last Dose  . 0.9 %  sodium chloride infusion   Intravenous Continuous Hooten, Illene Labrador, MD 100 mL/hr at 09/18/18 0313    . acetaminophen (OFIRMEV) IV 1,000 mg  1,000 mg Intravenous Q6H Hooten, Illene Labrador, MD 400 mL/hr at 09/18/18 0315 1,000 mg at 09/18/18 0315  . acetaminophen (TYLENOL) tablet 325-650 mg  325-650 mg Oral Q6H PRN Hooten, Illene Labrador, MD      . alum & mag hydroxide-simeth (MAALOX/MYLANTA) 200-200-20 MG/5ML suspension 30 mL  30 mL Oral Q4H PRN Hooten, Illene Labrador, MD      . atorvastatin (LIPITOR) tablet 10 mg  10 mg Oral Daily Hooten, Illene Labrador, MD   10 mg at 09/17/18 2104  . bisacodyl (DULCOLAX) suppository 10 mg  10 mg Rectal Daily PRN Hooten, Illene Labrador, MD      . carvedilol (COREG) tablet 25 mg  25 mg Oral BID Donato Heinz, MD   25 mg at 09/18/18 0911  . ceFAZolin (ANCEF) IVPB 2g/100 mL  premix  2 g Intravenous Q6H Hooten, Illene Labrador, MD 200 mL/hr at 09/18/18 0504 2 g at 09/18/18 0504  . celecoxib (CELEBREX) capsule 200 mg  200 mg Oral BID Donato Heinz, MD   200 mg at 09/18/18 0911  . diphenhydrAMINE (BENADRYL) 12.5 MG/5ML elixir 12.5-25 mg  12.5-25 mg Oral Q4H PRN Hooten, Illene Labrador, MD      . enoxaparin (LOVENOX) injection 30 mg  30 mg Subcutaneous Q12H Hooten, Illene Labrador, MD   30 mg at 09/18/18 7544  . ferrous sulfate tablet 325 mg  325 mg Oral BID WC Hooten, Illene Labrador, MD   325 mg at 09/18/18 0813  . FLUoxetine (PROZAC) capsule 20 mg  20 mg Oral Daily Hooten,  Illene Labrador, MD   20 mg at 09/18/18 9201  . furosemide (LASIX) tablet 20 mg  20 mg Oral Daily Donato Heinz, MD   20 mg at 09/18/18 0911  . gabapentin (NEURONTIN) capsule 300 mg  300 mg Oral QHS Hooten, Illene Labrador, MD   300 mg at 09/17/18 2106  . HYDROmorphone (DILAUDID) injection 0.5-1 mg  0.5-1 mg Intravenous Q4H PRN Hooten, Illene Labrador, MD      . insulin aspart (novoLOG) injection 0-5 Units  0-5 Units Subcutaneous QHS Hooten, Illene Labrador, MD   5 Units at 09/17/18 2258  . insulin aspart (novoLOG) injection 0-9 Units  0-9 Units Subcutaneous TID WC Hooten, Illene Labrador, MD   7 Units at 09/18/18 [phone]  . insulin aspart (novoLOG) injection 4 Units  4 Units Subcutaneous BID AC Hooten, Illene Labrador, MD      . insulin glargine (LANTUS) injection 32 Units  32 Units Subcutaneous QHS Donato Heinz, MD   32 Units at 09/17/18 2259  . levothyroxine (SYNTHROID, LEVOTHROID) tablet 150 mcg  150 mcg Oral QAC breakfast Donato Heinz, MD   150 mcg at 09/18/18 0501  . magnesium hydroxide (MILK OF MAGNESIA) suspension 30 mL  30 mL Oral Daily Hooten, Illene Labrador, MD   30 mL at 09/18/18 0912  . menthol-cetylpyridinium (CEPACOL) lozenge 3 mg  1 lozenge Oral PRN Hooten, Illene Labrador, MD       Or  . phenol (CHLORASEPTIC) mouth spray 1 spray  1 spray Mouth/Throat PRN Hooten, Illene Labrador, MD      . metoCLOPramide (REGLAN) tablet 5-10 mg  5-10 mg Oral Q8H PRN Hooten, Illene Labrador, MD       Or  . metoCLOPramide (REGLAN) injection 5-10 mg  5-10 mg Intravenous Q8H PRN Hooten, Illene Labrador, MD      . metoCLOPramide (REGLAN) tablet 10 mg  10 mg Oral TID AC & HS Hooten, Illene Labrador, MD   10 mg at 09/18/18 0814  . ondansetron (ZOFRAN) tablet 4 mg  4 mg Oral Q6H PRN Hooten, Illene Labrador, MD       Or  . ondansetron (ZOFRAN) injection 4 mg  4 mg Intravenous Q6H PRN Hooten, Illene Labrador, MD      . oxyCODONE (Oxy IR/ROXICODONE) immediate release tablet 10 mg  10 mg Oral Q4H PRN Hooten, Illene Labrador, MD      . oxyCODONE (Oxy IR/ROXICODONE) immediate release tablet 5 mg  5 mg Oral Q4H PRN Hooten,  Illene Labrador, MD      . pantoprazole (PROTONIX) EC tablet 40 mg  40 mg Oral BID Donato Heinz, MD   40 mg at 09/18/18 0911  . quinapril (ACCUPRIL) tablet 20 mg  20 mg Oral Daily Hooten, Illene Labrador, MD  20 mg at 09/18/18 0910  . senna-docusate (Senokot-S) tablet 1 tablet  1 tablet Oral BID Donato Heinz, MD   1 tablet at 09/18/18 0911  . sodium phosphate (FLEET) 7-19 GM/118ML enema 1 enema  1 enema Rectal Once PRN Hooten, Illene Labrador, MD      . traMADol Janean Sark) tablet 50-100 mg  50-100 mg Oral Q4H PRN Donato Heinz, MD   100 mg at 09/18/18 0813  . [START ON 09/22/2018] Vitamin D (Ergocalciferol) (DRISDOL) capsule 50,000 Units  50,000 Units Oral Weekly Hooten, Illene Labrador, MD         Discharge Medications: Please see discharge summary for a list of discharge medications.  Relevant Imaging Results:  Relevant Lab Results:   Additional Information (SSN: [id number])  Malia Corsi, Darleen Crocker, LCSW

## 2018-09-18 NOTE — Progress Notes (Signed)
   Subjective: 1 Day Post-Op Procedure(s) (LRB): LEFT TOTAL KNEE ARTHROPLASTY (Left) Patient reports pain as mild.   Patient is well, and has had no acute complaints or problems We will start therapy today.  Plan is to go Home after hospital stay. no nausea and no vomiting Patient denies any chest pains or shortness of breath. Voicing no complaints.  Slept off and on during the night  Objective: Vital signs in last 24 hours: Temp:  [97.4 F (36.3 C)-98 F (36.7 C)] 97.5 F (36.4 C) (02/25 0732) Pulse Rate:  [55-70] 58 (02/25 0732) Resp:  [11-19] 16 (02/25 0732) BP: (113-165)/(49-69) 165/65 (02/25 0732) SpO2:  [93 %-99 %] 96 % (02/25 0732) Weight:  [98.4 kg] 98.4 kg (02/24 1928) Heels are non tender and elevated off the bed using rolled towels along with bone foam under operative heel  Intake/Output from previous day: 02/24 0701 - 02/25 0700 In: 3185.2 [P.O.:240; I.V.:2645.2; IV Piggyback:300] Out: 780 [Urine:450; Drains:180; Blood:150] Intake/Output this shift: No intake/output data recorded.  No results for input(s): HGB in the last 72 hours. No results for input(s): WBC, RBC, HCT, PLT in the last 72 hours. Recent Labs    09/17/18 2234  GLUCOSE 431*   No results for input(s): LABPT, INR in the last 72 hours.  EXAM General - Patient is Alert, Appropriate and Oriented Extremity - Neurologically intact Neurovascular intact Sensation intact distally Intact pulses distally Incision: dressing C/D/I Compartment soft Dressing - dressing C/D/I Motor Function - intact, moving foot and toes well on exam.  Able to do straight leg raise on her own  Past Medical History:  Diagnosis Date  . Arthritis    degenerative arthritis left knee  . Carpal tunnel syndrome, bilateral   . Chronic kidney disease    stage 3  . Depression   . Diabetes mellitus without complication (HCC)   . 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 History of shingles   . Hyperlipidemia   . Hypertension   . Hypothyroidism   . Obesity     Assessment/Plan: 1 Day Post-Op Procedure(s) (LRB): LEFT TOTAL KNEE ARTHROPLASTY (Left) Active Problems:   Total knee replacement status  Estimated body mass index is 37.25 kg/m as calculated from the following:   Height as of this encounter: 5\' 4"  (1.626 m).   Weight as of this encounter: 98.4 kg. Advance diet Up with therapy D/C IV fluids Plan for discharge tomorrow Discharge home with home health  Labs: None DVT Prophylaxis - Lovenox, Foot Pumps and TED hose Weight-Bearing as tolerated to left leg D/C O2 and Pulse OX and try on Room Air Begin working on bowel movement  Didi Ganaway [facility] 09/18/2018, 7:40 AM

## 2018-09-18 NOTE — Progress Notes (Signed)
Inpatient Diabetes Program Recommendations  AACE/ADA: New Consensus Statement on Inpatient Glycemic Control (2015)  Target Ranges:  Prepandial:   less than 140 mg/dL      Peak postprandial:   less than 180 mg/dL (1-2 hours)      Critically ill patients:  140 - 180 mg/dL   Lab Results  Component Value Date   GLUCAP 343 (H) 09/18/2018   HGBA1C 7.4 (H) 09/05/2018    Review of Glycemic Control Results for Mary [name], Mary [name] (MRN [id number]) as of 09/18/2018 13:28  Ref. Range 09/17/2018 21:18 09/17/2018 22:15 09/18/2018 07:34 09/18/2018 12:01  Glucose-Capillary Latest Ref Range: 70 - 99 mg/dL 938 (H) 101 (H) 751 (H) 343 (H)  Diabetes history: Type 1 DM- see's Dr. Tedd Sias Outpatient Diabetes medications:  Lantus 32 units q HS, Novolog 4 units tid with meals Accuchek "Guide Me" meter- 12A- goal-110, I:C-12, Sensitivity-30  12P-goal-110, I:C-6, Sensitivity-25    5P-goal-110, I:C-9, Sensitivity-25 Inpatient Diabetes Program Recommendations:    Note that blood sugars increased last night and this morning.  Patient did receive Decadron 8 mg prior to surgery which has likely increased blood sugars.  Discussed with patient.  She uses app. On phone to determine rapid acting insulin doses with meals.  States that blood sugars have improved.  She is not currently wearing her Dexcom sensor and states she is using glucose meter.  Encouraged patient to let nurse know if she feels that she needs blood sugar checked and also to let them know if she has questions about insulin doses, etc. so that MD can be called.  She verbalized understanding.   MD, please increase Novolog meal coverage to tid with meals (instead of bid).    Thanks,  Beryl Meager, RN, BC-ADM Inpatient Diabetes Coordinator Pager [phone] (8a-5p)

## 2018-09-18 NOTE — Care Management Note (Signed)
Case Management Note  Patient Details  Name: Mary [name] MRN: [id number] Date of Birth: 02/15/1946  Subjective/Objective:                  Met with the patient to discuss DC plan and needs She has a RW and BSC at home HH list provided to the patient per CMS.gov Patient has transportation set up with her daughter Will check the price of the Lovenox before the patient goes home Patient uses [facility] on [address] for Pharmacy  Dr. Gilbert is the PCP  Action/Plan: HH list provided to the patient per CMS.gov Patient would like to wait to choose the HH agency  Expected Discharge Date:                  Expected Discharge Plan:     In-House Referral:     Discharge planning Services  CM Consult  Post Acute Care Choice:    Choice offered to:  Patient  DME Arranged:    DME Agency:     HH Arranged:  PT HH Agency:     Status of Service:  In process, will continue to follow  If discussed at Long Length of Stay Meetings, dates discussed:    Additional Comments:  Deliliah J Gregory, RN 09/18/2018, 11:56 AM  

## 2018-09-18 NOTE — Evaluation (Signed)
Occupational Therapy Evaluation Patient Details Name: Mary [name] MRN: [id number] DOB: 1946-07-21 Today's Date: 09/18/2018    History of Present Illness Patient is a 73 year old female S/P L TKA.     Clinical Impression   Pt seen for OT evaluation this date, POD#1 from above surgery. Pt was independent in all ADLs prior to surgery, however using SPC for mobility due to L knee pain. Pt is eager to return to PLOF with less pain and improved safety and independence. Pt currently requires minimal assist for LB dressing and bathing while in seated position due to pain and limited AROM of L knee. Pt verbalizes plan that daughter can assist at home. Pt instructed in polar care mgt, falls prevention strategies, home/routines modifications, DME/AE for LB bathing and dressing tasks, and compression stocking mgt. Pt able to return demo proper technique at AE for LB dressing. Pt benefited maximally from OT evaluation and treatment provided. No additional skilled OT services indicated at this time. Will sign off. Please re-consult if additional needs arise.     Follow Up Recommendations  No OT follow up    Equipment Recommendations  3 in 1 bedside commode    Recommendations for Other Services       Precautions / Restrictions Precautions Precautions: Fall Restrictions Weight Bearing Restrictions: Yes LLE Weight Bearing: Weight bearing as tolerated      Mobility Bed Mobility             General bed mobility comments: deferred, up in recliner at start/end of session  Transfers Overall transfer level: Needs assistance Equipment used: Rolling walker (2 wheeled) Transfers: Sit to/from Stand Sit to Stand: Min guard         General transfer comment: cues for proper technique, hand placement    Balance Overall balance assessment: Needs assistance Sitting-balance support: Feet unsupported;No upper extremity supported Sitting balance-Leahy Scale: Normal     Standing balance  support: Bilateral upper extremity supported Standing balance-Leahy Scale: Fair Standing balance comment: a little shaky                           ADL either performed or assessed with clinical judgement   ADL Overall ADL's : Needs assistance/impaired                                       General ADL Comments: Min A for LB ADL tasks which daughter can assist with, per pt report. Pt reports plan for dtr to assist with compression stockings as well     Vision Baseline Vision/History: Wears glasses Wears Glasses: Reading only Patient Visual Report: No change from baseline       Perception     Praxis      Pertinent Vitals/Pain Pain Assessment: 0-10 Pain Score: 4  Pain Location: L knee Pain Descriptors / Indicators: Aching;Discomfort;Sore Pain Intervention(s): Limited activity within patient's tolerance;Monitored during session;Premedicated before session;Repositioned;Ice applied     Hand Dominance Right   Extremity/Trunk Assessment Upper Extremity Assessment Upper Extremity Assessment: Overall WFL for tasks assessed   Lower Extremity Assessment Lower Extremity Assessment: Overall WFL for tasks assessed(expected post-op strength/ROM deficits in LLE, but functionally does well)   Cervical / Trunk Assessment Cervical / Trunk Assessment: Normal   Communication Communication Communication: No difficulties   Cognition Arousal/Alertness: Awake/alert Behavior During Therapy: WFL for tasks assessed/performed Overall Cognitive Status: Within  Functional Limits for tasks assessed                                     General Comments       Exercises Other Exercises Other Exercises: pt instructed in falls prevention, compression stocking mgt,  polar care mgt, AE/DME, self care skills, home/routines modifications Other Exercises: pt able to demo proper technique for using AE for LB dressing   Shoulder Instructions      Home Living  Family/patient expects to be discharged to:: Private residence Living Arrangements: Children Available Help at Discharge: Family;Available 24 hours/day Type of Home: House Home Access: Stairs to enter [employer] of Steps: 3 Entrance Stairs-Rails: Right Home Layout: One level     Bathroom Shower/Tub: Chief Strategy Officer: Standard     Home Equipment: Production assistant, radio - single point          Prior Functioning/Environment Level of Independence: Independent with assistive device(s)        Comments: was using cane prior to surgery, no falls, indep with ADL, daughter assisted with cooking/cleaning, pt managed her own medications        OT Problem List: Decreased strength;Decreased range of motion;Pain      OT Treatment/Interventions:      OT Goals(Current goals can be found in the care plan section) Acute Rehab OT Goals Patient Stated Goal: to return home OT Goal Formulation: All assessment and education complete, DC therapy  OT Frequency:     Barriers to D/C:            Co-evaluation              AM-PAC OT "6 Clicks" Daily Activity     Outcome Measure Help from another person eating meals?: None Help from another person taking care of personal grooming?: None Help from another person toileting, which includes using toliet, bedpan, or urinal?: A Little Help from another person bathing (including washing, rinsing, drying)?: A Little Help from another person to put on and taking off regular upper body clothing?: None Help from another person to put on and taking off regular lower body clothing?: A Little 6 Click Score: 21   End of Session    Activity Tolerance: Patient tolerated treatment well Patient left: in chair;with call bell/phone within reach;with chair alarm set;with SCD's reapplied;Other (comment)(polar care in place)  OT Visit Diagnosis: Other abnormalities of gait and mobility (R26.89);Pain Pain - Right/Left: Left Pain -  part of body: Knee                Time: 6195-0932 OT Time Calculation (min): 30 min Charges:  OT General Charges $OT Visit: 1 Visit OT Evaluation $OT Eval Low Complexity: 1 Low OT Treatments $Self Care/Home Management : 23-37 mins  Richrd Prime, MPH, MS, OTR/L ascom [phone] 09/18/18, 11:45 AM

## 2018-09-18 NOTE — Anesthesia Postprocedure Evaluation (Signed)
Anesthesia Post Note  Patient: Mary [name]  Procedure(s) Performed: LEFT TOTAL KNEE ARTHROPLASTY (Left Knee)  Patient location during evaluation: Nursing Unit Anesthesia Type: Spinal Level of consciousness: oriented and awake and alert Pain management: pain level controlled Vital Signs Assessment: post-procedure vital signs reviewed and stable Respiratory status: spontaneous breathing and respiratory function stable Cardiovascular status: blood pressure returned to baseline and stable Postop Assessment: no headache, no backache, no apparent nausea or vomiting and patient able to bend at knees Anesthetic complications: no     Last Vitals:  Vitals:   09/17/18 2302 09/18/18 0732  BP: 136/62 (!) 165/65  Pulse: (!) 58 (!) 58  Resp: 16 16  Temp: 36.4 C (!) 36.4 C  SpO2: 99% 96%    Last Pain:  Vitals:   09/18/18 0732  TempSrc: Oral  PainSc:                  Mary [name]

## 2018-09-18 NOTE — Discharge Summary (Signed)
Physician Discharge Summary  Patient ID: Mary [name] MRN: [id number] DOB/AGE: 28-Sep-1945 73 y.o.  Admit date: 09/17/2018 Discharge date: 09/19/2018  Admission Diagnoses:  PRIMARY OSTEOARTHRITIS OF LEFT KNEE   Discharge Diagnoses: Patient Active Problem List   Diagnosis Date Noted  . Total knee replacement status 09/17/2018  . Primary osteoarthritis of both knees 07/01/2018  . Chronic kidney disease (CKD) stage G3a/A1, moderately decreased glomerular filtration rate (GFR) between 45-59 mL/min/1.73 square meter and albuminuria creatinine ratio less than 30 mg/g (HCC) 04/08/2015  . Hypothyroidism 04/07/2015  . Obesity 04/07/2015  . Hyperlipidemia 04/07/2015  . Depression, major 04/07/2015  . Claustrophobia 04/07/2015  . Hypertension 04/07/2015  . Diabetes mellitus type 1, uncontrolled (HCC) 09/30/2014    Past Medical History:  Diagnosis Date  . Arthritis    degenerative arthritis left knee  . Carpal tunnel syndrome, bilateral   . Chronic kidney disease    stage 3  . Depression   . Diabetes mellitus without complication (HCC)   . 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 History of shingles   . Hyperlipidemia   . Hypertension   . Hypothyroidism   . Obesity      Transfusion: No transfusions during this admission   Consultants (if any):   Discharged Condition: Improved  Hospital Course: [name] is an 73 y.o. female who was admitted 09/17/2018 with a diagnosis of degenerative arthrosis left knee and went to the operating room on 09/17/2018 and underwent the above named procedures.    Surgeries:Procedure(s): LEFT TOTAL KNEE ARTHROPLASTY on 09/17/2018  PRE-OPERATIVE DIAGNOSIS: Degenerative arthrosis of the left knee, primary  POST-OPERATIVE DIAGNOSIS:  Same  PROCEDURE:  Left total knee arthroplasty using computer-assisted navigation  SURGEON:  Jena Gauss.  M.D.  ASSISTANT: Lewis Moccasin, PA-C (present and scrubbed throughout the case, critical for assistance with exposure, retraction, instrumentation, and closure)  ANESTHESIA: spinal  ESTIMATED BLOOD LOSS: 150 mL  FLUIDS REPLACED: 1800 mL of crystalloid  TOURNIQUET TIME:#1 -11 minutes                         #2 -24 minutes  DRAINS: 2 medium Hemovac drains  SOFT TISSUE RELEASES: Anterior cruciate ligament, posterior cruciate ligament, deep medial collateral ligament, patellofemoral ligament  IMPLANTS UTILIZED: DePuy Attune size 5N posterior stabilized femoral component (cemented), size 3 rotating platform tibial component (cemented), 35 mm medialized dome patella (cemented), and a 7 mm stabilized rotating platform polyethylene insert.  INDICATIONS FOR SURGERY: Mary [name] is a 73 y.o. year old female with a long history of progressive knee pain. X-rays demonstrated severe degenerative changes in tricompartmental fashion. The patient had not seen any significant improvement despite conservative nonsurgical intervention. After discussion of the risks and benefits of surgical intervention, the patient expressed understanding of the risks benefits and agree with plans for total knee arthroplasty.   The risks, benefits, and alternatives were discussed at length including but not limited to the risks of infection, bleeding, nerve injury, stiffness, blood clots, the need for revision surgery, cardiopulmonary complications, among others, and they were willing to proceed. Patient tolerated the surgery well. No complications .Patient was taken to PACU where she was stabilized and then transferred to the orthopedic floor.  Patient started on Lovenox 30 mg q 12 hrs. Foot pumps applied bilaterally at 80 mm hgb. Heels elevated off bed with rolled towels. No evidence of  DVT. Calves non tender. Negative Homan. Physical therapy started on day #1 for gait training and transfer with OT starting on  day  #1 for ADL and assisted devices. Patient has done well with therapy. Ambulated greater than 200 feet upon being discharged.  Was able to ascend and descend 4 steps safely and independently  Patient's IV And Foley were discontinued on day #1 with Hemovac being discontinued on day #2. Dressing was changed on day 2 prior to patient being discharged   She was given perioperative antibiotics:  Anti-infectives (From admission, onward)   Start     Dose/Rate Route Frequency Ordered Stop   09/17/18 1730  ceFAZolin (ANCEF) IVPB 2g/100 mL premix     2 g 200 mL/hr over 30 Minutes Intravenous Every 6 hours 09/17/18 1722 09/18/18 1729   09/17/18 1153  gentamicin (GARAMYCIN) 80 mg in sodium chloride 0.9 % 500 mL irrigation  Status:  Discontinued       As needed 09/17/18 1208 09/17/18 1503   09/17/18 0955  ceFAZolin (ANCEF) 2-4 GM/100ML-% IVPB    Note to Pharmacy:  Agnes Lawrence  : cabinet override      09/17/18 0955 09/17/18 2214   09/17/18 0600  ceFAZolin (ANCEF) IVPB 2g/100 mL premix  Status:  Discontinued     2 g 200 mL/hr over 30 Minutes Intravenous On call to O.R. 09/16/18 2216 09/17/18 1002    .  She was fitted with AV 1 compression foot pump devices, instructed on heel pumps, early ambulation, and fitted with TED stockings bilaterally for DVT prophylaxis.  She benefited maximally from the hospital stay and there were no complications.    Recent vital signs:  Vitals:   09/17/18 2302 09/18/18 0732  BP: 136/62 (!) 165/65  Pulse: (!) 58 (!) 58  Resp: 16 16  Temp: 97.6 F (36.4 C) (!) 97.5 F (36.4 C)  SpO2: 99% 96%    Recent laboratory studies:  Lab Results  Component Value Date   HGB 12.8 09/05/2018   HGB 13.0 11/20/2017   HGB 12.9 12/05/2016   Lab Results  Component Value Date   WBC 7.6 09/05/2018   PLT 191 09/05/2018   Lab Results  Component Value Date   INR 0.99 09/05/2018   Lab Results  Component Value Date   NA 137 09/05/2018   K 4.4 09/05/2018   CL 102  09/05/2018   CO2 29 09/05/2018   BUN 35 (H) 09/05/2018   CREATININE 1.16 (H) 09/05/2018   GLUCOSE 431 (H) 09/17/2018    Discharge Medications:   Allergies as of 09/18/2018      Reactions   Oxycodone Hcl Itching   Hydrocodone Itching      Medication List    STOP taking these medications   aspirin 81 MG tablet     TAKE these medications   acetaminophen 650 MG CR tablet Commonly known as:  TYLENOL Take 1,300 mg by mouth every 8 (eight) hours as needed for pain.   atorvastatin 10 MG tablet Commonly known as:  LIPITOR Take 1 tablet (10 mg total) by mouth daily.   carvedilol 12.5 MG tablet Commonly known as:  COREG TAKE 2 TABLETS BY MOUTH TWICE DAILY   enoxaparin 40 MG/0.4ML injection Commonly known as:  LOVENOX Inject 0.4 mLs (40 mg total) into the skin daily for 14 days. Start taking on:  September 20, 2018   FLUoxetine 20 MG capsule Commonly known as:  PROZAC TAKE 1 CAPSULE BY MOUTH ONCE DAILY   furosemide 20  MG tablet Commonly known as:  LASIX TAKE 1 TABLET BY MOUTH ONCE DAILY   HUMALOG KWIKPEN 100 UNIT/ML KiwkPen Generic drug:  insulin lispro Inject 2-10 Units into the skin 4 (four) times daily -  before meals and at bedtime.   Insulin Pen Needle 31G X 8 MM Misc by Does not apply route.   LANTUS SOLOSTAR 100 UNIT/ML Solostar Pen Generic drug:  Insulin Glargine Inject 32 Units into the skin at bedtime.   levothyroxine 150 MCG tablet Commonly known as:  SYNTHROID, LEVOTHROID TAKE 1 TABLET BY MOUTH ONCE DAILY What changed:  when to take this   NOVOLOG FLEXPEN 100 UNIT/ML FlexPen Generic drug:  insulin aspart Inject 4 Units into the skin 2 (two) times daily before lunch and supper.   oxyCODONE 5 MG immediate release tablet Commonly known as:  Oxy IR/ROXICODONE Take 1 tablet (5 mg total) by mouth every 4 (four) hours as needed for moderate pain (pain score 4-6).   quinapril 20 MG tablet Commonly known as:  ACCUPRIL TAKE 1 TABLET BY MOUTH ONCE DAILY    traMADol 50 MG tablet Commonly known as:  ULTRAM Take 1-2 tablets (50-100 mg total) by mouth every 6 (six) hours as needed for moderate pain.   Vitamin D (Ergocalciferol) 1.25 MG (50000 UT) Caps capsule Commonly known as:  DRISDOL TAKE 1 CAPSULE BY MOUTH ONCE A WEEK What changed:  when to take this            Durable Medical Equipment  (From admission, onward)         Start     Ordered   09/17/18 1723  DME Walker rolling  Once    Question:  Patient needs a walker to treat with the following condition  Answer:  Total knee replacement status   09/17/18 1722   09/17/18 1723  DME Bedside commode  Once    Question:  Patient needs a bedside commode to treat with the following condition  Answer:  Total knee replacement status   09/17/18 1722          Diagnostic Studies: Dg Knee Left Port  Result Date: 09/17/2018 CLINICAL DATA:  73 year old female post total knee replacement. Initial encounter. EXAM: PORTABLE LEFT KNEE - 1-2 VIEW COMPARISON:  None. FINDINGS: Post total left knee replacement which appears in satisfactory position without complication noted. Pin tracks distal femur and proximal tibia. Vascular calcifications. Drain in place. IMPRESSION: Post total left knee replacement. Electronically Signed   By: Lacy Duverney M.D.   On: 09/17/2018 16:21    Disposition:   Discharge Instructions    Increase activity slowly   Complete by:  As directed       Follow-up Information    Tera Partridge, PA On 10/02/2018.   Specialty:  Physician Assistant Why:  at 1:15pm Contact information: [address] [place] [address] [phone]        Donato Heinz, MD On 10/30/2018.   Specialty:  Orthopedic Surgery Why:  at 10:00am Contact information: [address] [facility] [place] [address] [phone]            Signed: Tera Partridge 09/18/2018, 7:45 AM

## 2018-09-18 NOTE — Progress Notes (Addendum)
     09/18/18 1200  Clinical Encounter Type  Visited With Patient and family together  Visit Type Initial  Referral From Nurse  Spiritual Encounters  Spiritual Needs Brochure   OR received to complete or update an AD. Upon arrival, the patient was sitting up in the recliner chair. Sister-in-law at the bedside. The patient confirmed her interest in hearing about the document; chaplain provided education per the need. If interested in completing once she has reviewed and made her decisions, she will request that her nurse page the chaplain for next steps.

## 2018-09-18 NOTE — Evaluation (Signed)
Physical Therapy Evaluation Patient Details Name: Mary [name] MRN: [id number] DOB: Mar 18, 1946 Today's Date: 09/18/2018   History of Present Illness  Patient is a 73 year old female S/P L TKA.    Clinical Impression  Patient received in bed, reports minimal pain, agreeable to PT Evaluation.  Patient performed bed mobility with modified independence, increased time and use of rails. Patient transfers with min guard, cues for hand placement and safety. Ambulated 100 feet with rolling walker and min guard. Patient shaky with walking and requires cues to stay close to and inside of walker for safety. Patient will benefit from continued skilled PT to improve functional independence, increase strength and ROM of left LE.      Follow Up Recommendations Home health PT    Equipment Recommendations  Rolling walker with 5" wheels;3in1 (PT)    Recommendations for Other Services       Precautions / Restrictions Precautions Precautions: Fall Restrictions Weight Bearing Restrictions: Yes LLE Weight Bearing: Weight bearing as tolerated      Mobility  Bed Mobility Overal bed mobility: Modified Independent             General bed mobility comments: use of rails, increased time  Transfers Overall transfer level: Needs assistance Equipment used: Rolling walker (2 wheeled) Transfers: Sit to/from Stand Sit to Stand: Min guard         General transfer comment: cues for proper technique, hand placement  Ambulation/Gait Ambulation/Gait assistance: Min assist Gait Distance (Feet): 100 Feet Assistive device: Rolling walker (2 wheeled) Gait Pattern/deviations: Step-to pattern;Decreased step length - right;Decreased step length - left Gait velocity: decreased   General Gait Details: shaky with ambulation, cues to stay close to and inside RW  Stairs            Wheelchair Mobility    Modified Rankin (Stroke Patients Only)       Balance Overall balance assessment: Needs  assistance Sitting-balance support: Feet unsupported;No upper extremity supported Sitting balance-Leahy Scale: Normal     Standing balance support: Bilateral upper extremity supported Standing balance-Leahy Scale: Fair Standing balance comment: shaky with dynamic standing activities                             Pertinent Vitals/Pain Pain Assessment: 0-10 Pain Score: 2  Pain Descriptors / Indicators: Aching;Discomfort;Sore Pain Intervention(s): Limited activity within patient's tolerance;Monitored during session;Repositioned;Premedicated before session;Ice applied    Home Living Family/patient expects to be discharged to:: Private residence Living Arrangements: Children Available Help at Discharge: Family;Available 24 hours/day Type of Home: House Home Access: Stairs to enter Entrance Stairs-Rails: Right Entrance Stairs-Number of Steps: 3 Home Layout: One level Home Equipment: Shower seat;Cane - single point      Prior Function Level of Independence: Independent with assistive device(s)         Comments: was using cane prior to surgery     Hand Dominance        Extremity/Trunk Assessment   Upper Extremity Assessment Upper Extremity Assessment: Defer to OT evaluation    Lower Extremity Assessment Lower Extremity Assessment: Overall WFL for tasks assessed       Communication   Communication: No difficulties  Cognition Arousal/Alertness: Awake/alert Behavior During Therapy: WFL for tasks assessed/performed Overall Cognitive Status: Within Functional Limits for tasks assessed  General Comments      Exercises Total Joint Exercises Ankle Circles/Pumps: AROM;Both;10 reps Quad Sets: AROM;5 reps;Left Straight Leg Raises: AROM;5 reps;Left Long Arc Quad: AROM;10 reps;Left Goniometric ROM: 0-80   Assessment/Plan    PT Assessment Patient needs continued PT services  PT Problem List Decreased  strength;Decreased balance;Decreased knowledge of precautions;Pain;Decreased range of motion;Decreased mobility;Decreased knowledge of use of DME;Decreased activity tolerance;Decreased coordination;Decreased safety awareness       PT Treatment Interventions DME instruction;Functional mobility training;Balance training;Patient/family education;Gait training;Therapeutic activities;Neuromuscular re-education;Stair training;Therapeutic exercise    PT Goals (Current goals can be found in the Care Plan section)  Acute Rehab PT Goals Patient Stated Goal: to return home PT Goal Formulation: With patient Time For Goal Achievement: 09/25/18 Potential to Achieve Goals: Good    Frequency BID   Barriers to discharge        Co-evaluation               AM-PAC PT "6 Clicks" Mobility  Outcome Measure Help needed turning from your back to your side while in a flat bed without using bedrails?: A Little Help needed moving from lying on your back to sitting on the side of a flat bed without using bedrails?: A Little Help needed moving to and from a bed to a chair (including a wheelchair)?: A Little Help needed standing up from a chair using your arms (e.g., wheelchair or bedside chair)?: A Little Help needed to walk in hospital room?: A Little Help needed climbing 3-5 steps with a railing? : A Little 6 Click Score: 18    End of Session Equipment Utilized During Treatment: Gait belt Activity Tolerance: Patient tolerated treatment well Patient left: in chair;with chair alarm set;with call bell/phone within reach;with SCD's reapplied Nurse Communication: Mobility status PT Visit Diagnosis: Muscle weakness (generalized) (M62.81);Unsteadiness on feet (R26.81);Difficulty in walking, not elsewhere classified (R26.2);Pain Pain - Right/Left: Left Pain - part of body: Knee    Time: 0900-0930 PT Time Calculation (min) (ACUTE ONLY): 30 min   Charges:   PT Evaluation $PT Eval Moderate Complexity: 1  Mod PT Treatments $Gait Training: 8-22 mins        Natallia Stellmach, PT, GCS 09/18/18,9:48 AM

## 2018-09-18 NOTE — Progress Notes (Signed)
Physical Therapy Treatment Patient Details Name: Mary [name] MRN: [id number] DOB: 1945/12/06 Today's Date: 09/18/2018    History of Present Illness Patient is a 73 year old female S/P L TKA.      PT Comments    Patient received in chair, ready for session. Reports mild increased pain since this am. Patient continues to require multiple cues for safety with AD use and overall mobility. Patient able to increase gait distance this pm to 150 feet and performed L LE exercises with cues. Patient will continue to benefit from skilled PT to address her safety with mobility, strength and ROM of left knee for return to independent functional level.         Follow Up Recommendations  Home health PT     Equipment Recommendations  Rolling walker with 5" wheels;3in1 (PT)    Recommendations for Other Services       Precautions / Restrictions Precautions Precautions: Fall Restrictions Weight Bearing Restrictions: Yes LLE Weight Bearing: Weight bearing as tolerated    Mobility  Bed Mobility Overal bed mobility: Modified Independent             General bed mobility comments: performed sit to supine with supervision  Transfers Overall transfer level: Needs assistance Equipment used: Rolling walker (2 wheeled) Transfers: Sit to/from Stand Sit to Stand: Min guard         General transfer comment: cues for proper technique, hand placement  Ambulation/Gait Ambulation/Gait assistance: Min assist Gait Distance (Feet): 150 Feet Assistive device: Rolling walker (2 wheeled) Gait Pattern/deviations: Step-to pattern;Decreased step length - left;Decreased step length - right Gait velocity: decreased   General Gait Details: shaky with ambulation, cues to stay close to and inside RW, decreased safety awareness   Stairs             Wheelchair Mobility    Modified Rankin (Stroke Patients Only)       Balance Overall balance assessment: Needs assistance Sitting-balance  support: Feet unsupported;No upper extremity supported Sitting balance-Leahy Scale: Normal     Standing balance support: Bilateral upper extremity supported Standing balance-Leahy Scale: Fair Standing balance comment: a little shaky                            Cognition Arousal/Alertness: Awake/alert Behavior During Therapy: WFL for tasks assessed/performed Overall Cognitive Status: Within Functional Limits for tasks assessed                                 General Comments: slightly unsteady, impulsive with mobility. Requires cues for safety       Exercises Total Joint Exercises Ankle Circles/Pumps: AROM;10 reps;Both;Supine Quad Sets: AROM;10 reps;Supine Short Arc Quad: AROM;10 reps;Left;Supine Heel Slides: AROM;10 reps;Supine;Left Hip ABduction/ADduction: AROM;10 reps;Left;Supine Straight Leg Raises: AROM;10 reps;Left;Supine Other Exercises Other Exercises: pt instructed in falls prevention, compression stocking mgt,  polar care mgt, AE/DME, self care skills, home/routines modifications Other Exercises: pt able to demo proper technique for using AE for LB dressing    General Comments        Pertinent Vitals/Pain Pain Assessment: 0-10 Pain Score: 4  Pain Location: L knee Pain Descriptors / Indicators: Aching;Discomfort;Sore Pain Intervention(s): Limited activity within patient's tolerance;Monitored during session;Repositioned;Ice applied    Home Living Family/patient expects to be discharged to:: Private residence Living Arrangements: Children Available Help at Discharge: Family;Available 24 hours/day Type of Home: House Home Access: Stairs to  enter Entrance Stairs-Rails: Right Home Layout: One level Home Equipment: Shower seat;Cane - single point      Prior Function Level of Independence: Independent with assistive device(s)      Comments: was using cane prior to surgery, no falls, indep with ADL, daughter assisted with  cooking/cleaning, pt managed her own medications   PT Goals (current goals can now be found in the care plan section) Acute Rehab PT Goals Patient Stated Goal: to return home PT Goal Formulation: With patient Time For Goal Achievement: 09/25/18 Potential to Achieve Goals: Good Progress towards PT goals: Progressing toward goals    Frequency    BID      PT Plan Current plan remains appropriate    Co-evaluation              AM-PAC PT "6 Clicks" Mobility   Outcome Measure  Help needed turning from your back to your side while in a flat bed without using bedrails?: A Little Help needed moving from lying on your back to sitting on the side of a flat bed without using bedrails?: A Little Help needed moving to and from a bed to a chair (including a wheelchair)?: A Little Help needed standing up from a chair using your arms (e.g., wheelchair or bedside chair)?: A Little Help needed to walk in hospital room?: A Little Help needed climbing 3-5 steps with a railing? : A Little 6 Click Score: 18    End of Session Equipment Utilized During Treatment: Gait belt Activity Tolerance: Patient tolerated treatment well Patient left: in bed;with bed alarm set;with call bell/phone within reach;with family/visitor present Nurse Communication: Mobility status PT Visit Diagnosis: Muscle weakness (generalized) (M62.81);Difficulty in walking, not elsewhere classified (R26.2);Unsteadiness on feet (R26.81);Pain Pain - Right/Left: Left Pain - part of body: Knee     Time: 0240-9735 PT Time Calculation (min) (ACUTE ONLY): 28 min  Charges:  $Gait Training: 8-22 mins $Therapeutic Exercise: 8-22 mins                     Kendyl Bissonnette, PT, GCS 09/18/18,2:29 PM

## 2018-09-19 LAB — GLUCOSE, CAPILLARY
Glucose-Capillary: 143 mg/dL — ABNORMAL HIGH (ref 70–99)
Glucose-Capillary: 177 mg/dL — ABNORMAL HIGH (ref 70–99)
Glucose-Capillary: 105 mg/dL — ABNORMAL HIGH (ref 70–99)

## 2018-09-19 MED ORDER — INSULIN ASPART 100 UNIT/ML ~~LOC~~ SOLN
4.0000 [IU] | Freq: Three times a day (TID) | SUBCUTANEOUS | Status: DC
  Administered 2018-09-19: 4 [IU] via SUBCUTANEOUS
  Filled 2018-09-19: qty 1

## 2018-09-19 NOTE — Progress Notes (Signed)
Physical Therapy Treatment Patient Details Name: Mary [name] MRN: [id number] DOB: 07-04-1946 Today's Date: 09/19/2018    History of Present Illness Patient is a 73 year old female S/P L TKA.      PT Comments    Patient reports she needs to go to the bathroom upon entering room. Daughter present.  Assisted patient to bathroom then patient ambulated 220 feet with rw and min guard. Patient requires supervision for bed mobility and transfers. Performing exercises with good quality and cues only.  Patient will continue to benefit from skilled PT to improve strength, functional independence and safety.    Follow Up Recommendations  Home health PT     Equipment Recommendations  Rolling walker with 5" wheels;3in1 (PT)    Recommendations for Other Services       Precautions / Restrictions Precautions Precautions: Fall Restrictions Weight Bearing Restrictions: Yes LLE Weight Bearing: Weight bearing as tolerated    Mobility  Bed Mobility Overal bed mobility: Modified Independent             General bed mobility comments: independent with sit to supine with increased time  Transfers Overall transfer level: Modified independent Equipment used: Rolling walker (2 wheeled) Transfers: Sit to/from Stand Sit to Stand: Supervision;Modified independent (Device/Increase time)         General transfer comment: cues for proper technique, hand placement  Ambulation/Gait Ambulation/Gait assistance: Min guard Gait Distance (Feet): 220 Feet Assistive device: Rolling walker (2 wheeled) Gait Pattern/deviations: Step-through pattern;Decreased step length - left;Decreased step length - right Gait velocity: decreased   General Gait Details: improved ability, quality and smoothness with ambulation this day. Less frequent cues needed for safety.    Stairs             Wheelchair Mobility    Modified Rankin (Stroke Patients Only)       Balance Overall balance assessment:  Modified Independent Sitting-balance support: Feet unsupported;No upper extremity supported Sitting balance-Leahy Scale: Normal     Standing balance support: Bilateral upper extremity supported Standing balance-Leahy Scale: Good Standing balance comment: a little shaky                            Cognition Arousal/Alertness: Awake/alert Behavior During Therapy: WFL for tasks assessed/performed Overall Cognitive Status: Within Functional Limits for tasks assessed                                 General Comments: slightly unsteady, impulsive with mobility. Requires cues for safety       Exercises Total Joint Exercises Ankle Circles/Pumps: AROM;10 reps;Both Quad Sets: 10 reps;AROM;Left Gluteal Sets: AROM;10 reps;Left Short Arc Quad: AROM;10 reps;Left Heel Slides: AROM;10 reps;Left Hip ABduction/ADduction: AROM;10 reps;Left Straight Leg Raises: AROM;10 reps;Left Long Arc Quad: AROM;10 reps;Left Knee Flexion: AROM;5 reps;Left Goniometric ROM: 0-95    General Comments        Pertinent Vitals/Pain Pain Assessment: 0-10 Pain Score: 3  Pain Location: L knee Pain Descriptors / Indicators: Aching;Discomfort;Sore Pain Intervention(s): Monitored during session;Repositioned;Ice applied    Home Living                      Prior Function            PT Goals (current goals can now be found in the care plan section) Acute Rehab PT Goals Patient Stated Goal: to return home PT Goal Formulation:  With patient Time For Goal Achievement: 09/25/18 Potential to Achieve Goals: Good Progress towards PT goals: Progressing toward goals    Frequency    BID      PT Plan Current plan remains appropriate    Co-evaluation              AM-PAC PT "6 Clicks" Mobility   Outcome Measure  Help needed turning from your back to your side while in a flat bed without using bedrails?: None Help needed moving from lying on your back to sitting on the  side of a flat bed without using bedrails?: A Little Help needed moving to and from a bed to a chair (including a wheelchair)?: A Little Help needed standing up from a chair using your arms (e.g., wheelchair or bedside chair)?: A Little Help needed to walk in hospital room?: A Little Help needed climbing 3-5 steps with a railing? : A Little 6 Click Score: 19    End of Session Equipment Utilized During Treatment: Gait belt Activity Tolerance: Patient tolerated treatment well;Patient limited by fatigue;Patient limited by pain Patient left: in bed;with bed alarm set;with call bell/phone within reach;with family/visitor present Nurse Communication: Mobility status PT Visit Diagnosis: Unsteadiness on feet (R26.81);Muscle weakness (generalized) (M62.81);Difficulty in walking, not elsewhere classified (R26.2);Pain Pain - Right/Left: Left Pain - part of body: Knee     Time: 1240-1305 PT Time Calculation (min) (ACUTE ONLY): 25 min  Charges:  $Gait Training: 8-22 mins $Therapeutic Exercise: 8-22 mins                     Lashanna Angelo, PT, GCS 09/19/18,1:18 PM

## 2018-09-19 NOTE — Care Management (Signed)
Called [facility] pharmacy at [phone] to call in a verbal order for Lovenox 40 mg X 14 once daily.  Will call back to get price once runs the claim

## 2018-09-19 NOTE — Progress Notes (Signed)
Patient is alert and oriented and able to verbalize needs. No complaints of pain at this time. VSS. PIV removed. Daughter at bedside. Incision washed and new dressing applied. TED hose applied to BLE. All belongings packed. Printed AVS and hard scripts for Lovenox given to patient in discharge packet. All instructions, follow up care and appointments gone over at this time. Patient verbalizes understanding and no concerns voiced at this time. NT escorted patient to car via wc. Daughter to transport home.   Suzan Slick, RN

## 2018-09-19 NOTE — Progress Notes (Signed)
Per RN patient does need a rolling walker. Per RN case manager she will arrange for one to be sent to patient's home and patient can D/C home today.   [employer], LCSW [phone]

## 2018-09-19 NOTE — Progress Notes (Signed)
Physical Therapy Treatment Patient Details Name: Mary [name] MRN: [id number] DOB: Dec 03, 1945 Today's Date: 09/19/2018    History of Present Illness Patient is a 73 year old female S/P L TKA.      PT Comments    Patient received in room using BSC with NT present. Patient reports she feels slightly more pain this day, but feeling better overall. Patient requires supervision for transfers and cues for safety with AD. (to stay within RW and close to RW especially with turning.) Patient ambulated 150 feet and up/down 3 steps with left sided rail and min guard.  Cues for sequencing on steps. LE strengthening exercises with cues. Patient will continue to benefit from skilled PT to address her weakness, difficulty walking and decreased activity tolerance.     Follow Up Recommendations  Home health PT     Equipment Recommendations  Rolling walker with 5" wheels;3in1 (PT)    Recommendations for Other Services       Precautions / Restrictions Precautions Precautions: Fall Restrictions Weight Bearing Restrictions: Yes LLE Weight Bearing: Weight bearing as tolerated    Mobility  Bed Mobility               General bed mobility comments: Not assessed this am, patient received on BSC then into recliner after walking  Transfers Overall transfer level: Modified independent Equipment used: Rolling walker (2 wheeled) Transfers: Sit to/from Stand Sit to Stand: Supervision;Modified independent (Device/Increase time)         General transfer comment: cues for proper technique, hand placement  Ambulation/Gait Ambulation/Gait assistance: Min guard Gait Distance (Feet): 150 Feet Assistive device: Rolling walker (2 wheeled) Gait Pattern/deviations: Decreased step length - right;Decreased step length - left Gait velocity: decreased   General Gait Details: shaky with ambulation, cues to stay close to and inside RW, decreased safety awareness   Stairs             Wheelchair  Mobility    Modified Rankin (Stroke Patients Only)       Balance Overall balance assessment: Modified Independent Sitting-balance support: Feet unsupported;No upper extremity supported Sitting balance-Leahy Scale: Normal     Standing balance support: Bilateral upper extremity supported Standing balance-Leahy Scale: Good Standing balance comment: a little shaky                            Cognition Arousal/Alertness: Awake/alert Behavior During Therapy: WFL for tasks assessed/performed Overall Cognitive Status: Within Functional Limits for tasks assessed                                 General Comments: slightly unsteady, impulsive with mobility. Requires cues for safety       Exercises Total Joint Exercises Ankle Circles/Pumps: AROM;20 reps;Both Quad Sets: AROM;10 reps;Left Heel Slides: AROM;10 reps;Left Hip ABduction/ADduction: AROM;10 reps;Left Straight Leg Raises: 10 reps;AROM;Left Long Arc Quad: AROM;10 reps;Left Knee Flexion: AROM;5 reps;Left Goniometric ROM: 0-95    General Comments        Pertinent Vitals/Pain Pain Assessment: 0-10 Pain Score: 5  Pain Location: L knee Pain Descriptors / Indicators: Aching;Discomfort;Sore Pain Intervention(s): Limited activity within patient's tolerance;Monitored during session;Repositioned;Premedicated before session;Ice applied    Home Living                      Prior Function            PT Goals (  current goals can now be found in the care plan section) Acute Rehab PT Goals Patient Stated Goal: to return home PT Goal Formulation: With patient Time For Goal Achievement: 09/25/18 Potential to Achieve Goals: Good Progress towards PT goals: Progressing toward goals    Frequency    BID      PT Plan Current plan remains appropriate    Co-evaluation              AM-PAC PT "6 Clicks" Mobility   Outcome Measure  Help needed turning from your back to your side while in  a flat bed without using bedrails?: A Little Help needed moving from lying on your back to sitting on the side of a flat bed without using bedrails?: A Little Help needed moving to and from a bed to a chair (including a wheelchair)?: A Little Help needed standing up from a chair using your arms (e.g., wheelchair or bedside chair)?: A Little Help needed to walk in hospital room?: A Little Help needed climbing 3-5 steps with a railing? : A Little 6 Click Score: 18    End of Session Equipment Utilized During Treatment: Gait belt Activity Tolerance: Patient tolerated treatment well;Patient limited by fatigue;Patient limited by pain Patient left: in chair;with call bell/phone within reach;with chair alarm set Nurse Communication: Mobility status PT Visit Diagnosis: Muscle weakness (generalized) (M62.81);Difficulty in walking, not elsewhere classified (R26.2);Unsteadiness on feet (R26.81);Pain Pain - Right/Left: Left Pain - part of body: Knee     Time: 5997-7414 PT Time Calculation (min) (ACUTE ONLY): 23 min  Charges:  $Gait Training: 8-22 mins $Therapeutic Exercise: 8-22 mins                     Hannelore Bova, PT, GCS 09/19/18,9:49 AM

## 2018-09-19 NOTE — Care Management (Signed)
Called pharmacy at [phone] Lovenox price is $3.60, notified the Patient

## 2018-09-19 NOTE — Care Management (Signed)
Met with the patient to discuss which [facility] agency she would like to use, She wants to use [facility], [employer] with [facility] and let her know the patient would like to use [facility].

## 2018-09-19 NOTE — Progress Notes (Signed)
   Subjective: 2 Days Post-Op Procedure(s) (LRB): LEFT TOTAL KNEE ARTHROPLASTY (Left) Patient reports pain as mild.   Patient is well, and has had no acute complaints or problems Patient did very well therapy yesterday.  Was able to ambulate 100 feet in the morning 150 feet in the afternoon with a range of motion of 0 to 80 degrees of flexion. Plan is to go Home after hospital stay. no nausea and no vomiting Patient denies any chest pains or shortness of breath. Objective: Vital signs in last 24 hours: Temp:  [97.5 F (36.4 C)-98 F (36.7 C)] 98 F (36.7 C) (02/26 0016) Pulse Rate:  [58-63] 60 (02/26 0016) Resp:  [18] 18 (02/26 0016) BP: (122-134)/(49-77) 134/77 (02/26 0016) SpO2:  [94 %-100 %] 100 % (02/26 0016) well approximated incision Heels are non tender and elevated off the bed using rolled towels Intake/Output from previous day: 02/25 0701 - 02/26 0700 In: 600 [P.O.:600] Out: 90 [Drains:90] Intake/Output this shift: No intake/output data recorded.  No results for input(s): HGB in the last 72 hours. No results for input(s): WBC, RBC, HCT, PLT in the last 72 hours. Recent Labs    09/17/18 2234  GLUCOSE 431*   No results for input(s): LABPT, INR in the last 72 hours.  EXAM General - Patient is Alert, Appropriate and Oriented Extremity - Neurologically intact Neurovascular intact Sensation intact distally Intact pulses distally Dorsiflexion/Plantar flexion intact No cellulitis present Compartment soft Dressing - scant drainage Motor Function - intact, moving foot and toes well on exam.    Past Medical History:  Diagnosis Date  . Arthritis    degenerative arthritis left knee  . Carpal tunnel syndrome, bilateral   . Chronic kidney disease    stage 3  . Depression   . Diabetes mellitus without complication (HCC)   . 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 History of  shingles   . Hyperlipidemia   . Hypertension   . Hypothyroidism   . Obesity     Assessment/Plan: 2 Days Post-Op Procedure(s) (LRB): LEFT TOTAL KNEE ARTHROPLASTY (Left) Active Problems:   Total knee replacement status  Estimated body mass index is 37.25 kg/m as calculated from the following:   Height as of this encounter: 5\' 4"  (1.626 m).   Weight as of this encounter: 98.4 kg. Up with therapy Discharge home with home health  Labs: None DVT Prophylaxis - Lovenox, Foot Pumps and TED hose Weight-Bearing as tolerated to left leg Patient needs bowel movement prior to being discharged Please wash operative leg, change dressing and apply TED stockings to both legs before patient discharged Be sure bone foam goes home with patient  Lynnda Shields. [facility] PA [facility] 09/19/2018, 7:33 AM

## 2018-09-20 NOTE — Anesthesia Preprocedure Evaluation (Signed)
Anesthesia Evaluation  Patient identified by MRN, date of birth, ID band Patient awake    Reviewed: Allergy & Precautions, H&P , NPO status , Patient's Chart, lab work & pertinent test results, reviewed documented beta blocker date and time   History of Anesthesia Complications Negative for: history of anesthetic complications  Airway Mallampati: I  TM Distance: >3 FB Neck ROM: full    Dental  (+) Caps, Teeth Intact, Dental Advidsory Given   Pulmonary neg pulmonary ROS,           Cardiovascular Exercise Tolerance: Good hypertension, On Medications and On Home Beta Blockers (-) angina(-) CAD, (-) Past MI, (-) Cardiac Stents and (-) CABG (-) dysrhythmias (-) Valvular Problems/Murmurs     Neuro/Psych neg Seizures PSYCHIATRIC DISORDERS (Depression) Anxiety Depression  Neuromuscular disease    GI/Hepatic negative GI ROS, Neg liver ROS,   Endo/Other  diabetes, Type 1, Insulin DependentHypothyroidism   Renal/GU CRFRenal disease  negative genitourinary   Musculoskeletal   Abdominal   Peds  Hematology negative hematology ROS (+)   Anesthesia Other Findings Past Medical History: No date: Depression No date: Diabetes mellitus without complication (HCC) No date: History of shingles No date: Hyperlipidemia No date: Hypertension No date: Hypothyroidism No date: Obesity   Reproductive/Obstetrics negative OB ROS                             Anesthesia Physical  Anesthesia Plan  ASA: III  Anesthesia Plan: Spinal   Post-op Pain Management:    Induction:   PONV Risk Score and Plan: Propofol infusion and TIVA  Airway Management Planned: Simple Face Mask and Natural Airway  Additional Equipment:   Intra-op Plan:   Post-operative Plan:   Informed Consent: I have reviewed the patients History and Physical, chart, labs and discussed the procedure including the risks, benefits and alternatives  for the proposed anesthesia with the patient or authorized representative who has indicated his/her understanding and acceptance.     Dental Advisory Given  Plan Discussed with: Anesthesiologist, CRNA and Surgeon  Anesthesia Plan Comments:         Anesthesia Quick Evaluation

## 2018-09-21 ENCOUNTER — Telehealth: Payer: Self-pay

## 2018-09-21 DIAGNOSIS — Z96659 Presence of unspecified artificial knee joint: Secondary | ICD-10-CM | POA: Diagnosis not present

## 2018-09-21 NOTE — Telephone Encounter (Signed)
erro  neous encounter

## 2018-09-28 ENCOUNTER — Telehealth: Payer: Self-pay

## 2018-09-28 NOTE — Telephone Encounter (Signed)
LMTCB. Need to schedule AWV for this year. Direct # left to CB. -MM

## 2018-10-01 NOTE — Telephone Encounter (Signed)
Scheduled AWV for 12/31/18 @ 2:00 PM. -MM

## 2018-10-01 NOTE — Telephone Encounter (Signed)
Pt returned call ° °Mary [name] °

## 2018-10-02 DIAGNOSIS — N183 Chronic kidney disease, stage 3 (moderate): Secondary | ICD-10-CM | POA: Diagnosis not present

## 2018-10-02 DIAGNOSIS — E1022 Type 1 diabetes mellitus with diabetic chronic kidney disease: Secondary | ICD-10-CM | POA: Diagnosis not present

## 2018-10-02 DIAGNOSIS — R29898 Other symptoms and signs involving the musculoskeletal system: Secondary | ICD-10-CM | POA: Diagnosis not present

## 2018-10-02 DIAGNOSIS — M25562 Pain in left knee: Secondary | ICD-10-CM | POA: Diagnosis not present

## 2018-10-02 DIAGNOSIS — M25662 Stiffness of left knee, not elsewhere classified: Secondary | ICD-10-CM | POA: Diagnosis not present

## 2018-10-02 DIAGNOSIS — Z96652 Presence of left artificial knee joint: Secondary | ICD-10-CM | POA: Diagnosis not present

## 2018-10-04 DIAGNOSIS — E1065 Type 1 diabetes mellitus with hyperglycemia: Secondary | ICD-10-CM | POA: Diagnosis not present

## 2018-10-09 DIAGNOSIS — Z96652 Presence of left artificial knee joint: Secondary | ICD-10-CM | POA: Diagnosis not present

## 2018-10-09 DIAGNOSIS — M25562 Pain in left knee: Secondary | ICD-10-CM | POA: Diagnosis not present

## 2018-10-25 DIAGNOSIS — M25562 Pain in left knee: Secondary | ICD-10-CM | POA: Diagnosis not present

## 2018-10-25 DIAGNOSIS — Z96652 Presence of left artificial knee joint: Secondary | ICD-10-CM | POA: Diagnosis not present

## 2018-10-29 DIAGNOSIS — Z96652 Presence of left artificial knee joint: Secondary | ICD-10-CM | POA: Diagnosis not present

## 2018-10-29 DIAGNOSIS — M25562 Pain in left knee: Secondary | ICD-10-CM | POA: Diagnosis not present

## 2018-10-30 DIAGNOSIS — M1712 Unilateral primary osteoarthritis, left knee: Secondary | ICD-10-CM | POA: Diagnosis not present

## 2018-11-04 DIAGNOSIS — E1065 Type 1 diabetes mellitus with hyperglycemia: Secondary | ICD-10-CM | POA: Diagnosis not present

## 2018-11-07 ENCOUNTER — Other Ambulatory Visit: Payer: Self-pay | Admitting: Family Medicine

## 2018-11-07 DIAGNOSIS — E7849 Other hyperlipidemia: Secondary | ICD-10-CM

## 2018-11-09 DIAGNOSIS — E1022 Type 1 diabetes mellitus with diabetic chronic kidney disease: Secondary | ICD-10-CM | POA: Diagnosis not present

## 2018-11-21 DIAGNOSIS — Z471 Aftercare following joint replacement surgery: Secondary | ICD-10-CM | POA: Diagnosis not present

## 2018-12-04 DIAGNOSIS — E1065 Type 1 diabetes mellitus with hyperglycemia: Secondary | ICD-10-CM | POA: Diagnosis not present

## 2018-12-12 ENCOUNTER — Ambulatory Visit: Payer: Self-pay | Admitting: Family Medicine

## 2018-12-27 ENCOUNTER — Telehealth: Payer: Self-pay | Admitting: Family Medicine

## 2018-12-27 DIAGNOSIS — E1065 Type 1 diabetes mellitus with hyperglycemia: Secondary | ICD-10-CM | POA: Diagnosis not present

## 2018-12-31 ENCOUNTER — Ambulatory Visit (INDEPENDENT_AMBULATORY_CARE_PROVIDER_SITE_OTHER): Payer: Medicare Other

## 2018-12-31 ENCOUNTER — Other Ambulatory Visit: Payer: Self-pay

## 2018-12-31 DIAGNOSIS — Z13820 Encounter for screening for osteoporosis: Secondary | ICD-10-CM

## 2018-12-31 DIAGNOSIS — Z0000 Encounter for general adult medical examination without abnormal findings: Secondary | ICD-10-CM

## 2018-12-31 NOTE — Patient Instructions (Signed)
Mary [name] , Thank you for taking time to come for your Medicare Wellness Visit. I appreciate your ongoing commitment to your health goals. Please review the following plan we discussed and let me know if I can assist you in the future.   Screening recommendations/referrals: Colonoscopy: Up to date, due 02/2026. Mammogram: Up to date, due 12/2018 Bone Density: Ordered today. Pt provided with contact info and advised to call to schedule appt. Pt aware the office will call re: appt. Recommended yearly dental visit for hygiene and checkup  Vaccinations: Influenza vaccine: Up to date Pneumococcal vaccine: Completed series Tdap vaccine: Up to date, due 11/2022 Shingles vaccine: Pt declines today.     Advanced directives: Please bring a copy of your POA (Power of Attorney) and/or Living Will to your next appointment.   Conditions/risks identified: Continue to cut back on sugars and carbohydrates to help aid in weight loss and lower A1c.   Next appointment: 01/01/20 for an AWV. Declined scheduling a CPE at this time.    Preventive Care 56 Years and Older, Female Preventive care refers to lifestyle choices and visits with your health care provider that can promote health and wellness. What does preventive care include?  A yearly physical exam. This is also called an annual well check.  Dental exams once or twice a year.  Routine eye exams. Ask your health care provider how often you should have your eyes checked.  Personal lifestyle choices, including:  Daily care of your teeth and gums.  Regular physical activity.  Eating a healthy diet.  Avoiding tobacco and drug use.  Limiting alcohol use.  Practicing safe sex.  Taking low-dose aspirin every day.  Taking vitamin and mineral supplements as recommended by your health care provider. What happens during an annual well check? The services and screenings done by your health care provider during your annual well check will depend  on your age, overall health, lifestyle risk factors, and family history of disease. Counseling  Your health care provider may ask you questions about your:  Alcohol use.  Tobacco use.  Drug use.  Emotional well-being.  Home and relationship well-being.  Sexual activity.  Eating habits.  History of falls.  Memory and ability to understand (cognition).  Work and work Statistician.  Reproductive health. Screening  You may have the following tests or measurements:  Height, weight, and BMI.  Blood pressure.  Lipid and cholesterol levels. These may be checked every 5 years, or more frequently if you are over 36 years old.  Skin check.  Lung cancer screening. You may have this screening every year starting at age 44 if you have a 30-pack-year history of smoking and currently smoke or have quit within the past 15 years.  Fecal occult blood test (FOBT) of the stool. You may have this test every year starting at age 59.  Flexible sigmoidoscopy or colonoscopy. You may have a sigmoidoscopy every 5 years or a colonoscopy every 10 years starting at age 27.  Hepatitis C blood test.  Hepatitis B blood test.  Sexually transmitted disease (STD) testing.  Diabetes screening. This is done by checking your blood sugar (glucose) after you have not eaten for a while (fasting). You may have this done every 1-3 years.  Bone density scan. This is done to screen for osteoporosis. You may have this done starting at age 42.  Mammogram. This may be done every 1-2 years. Talk to your health care provider about how often you should have regular mammograms.  Talk with your health care provider about your test results, treatment options, and if necessary, the need for more tests. Vaccines  Your health care provider may recommend certain vaccines, such as:  Influenza vaccine. This is recommended every year.  Tetanus, diphtheria, and acellular pertussis (Tdap, Td) vaccine. You may need a Td  booster every 10 years.  Zoster vaccine. You may need this after age 36.  Pneumococcal 13-valent conjugate (PCV13) vaccine. One dose is recommended after age 21.  Pneumococcal polysaccharide (PPSV23) vaccine. One dose is recommended after age 83. Talk to your health care provider about which screenings and vaccines you need and how often you need them. This information is not intended to replace advice given to you by your health care provider. Make sure you discuss any questions you have with your health care provider. Document Released: 08/07/2015 Document Revised: 03/30/2016 Document Reviewed: 05/12/2015 Elsevier Interactive Patient Education  2017 [employer].  Fall Prevention in the Home Falls can cause injuries. They can happen to people of all ages. There are many things you can do to make your home safe and to help prevent falls. What can I do on the outside of my home?  Regularly fix the edges of walkways and driveways and fix any cracks.  Remove anything that might make you trip as you walk through a door, such as a raised step or threshold.  Trim any bushes or trees on the path to your home.  Use bright outdoor lighting.  Clear any walking paths of anything that might make someone trip, such as rocks or tools.  Regularly check to see if handrails are loose or broken. Make sure that both sides of any steps have handrails.  Any raised decks and porches should have guardrails on the edges.  Have any leaves, snow, or ice cleared regularly.  Use sand or salt on walking paths during winter.  Clean up any spills in your garage right away. This includes oil or grease spills. What can I do in the bathroom?  Use night lights.  Install grab bars by the toilet and in the tub and shower. Do not use towel bars as grab bars.  Use non-skid mats or decals in the tub or shower.  If you need to sit down in the shower, use a plastic, non-slip stool.  Keep the floor dry. Clean up  any water that spills on the floor as soon as it happens.  Remove soap buildup in the tub or shower regularly.  Attach bath mats securely with double-sided non-slip rug tape.  Do not have throw rugs and other things on the floor that can make you trip. What can I do in the bedroom?  Use night lights.  Make sure that you have a light by your bed that is easy to reach.  Do not use any sheets or blankets that are too big for your bed. They should not hang down onto the floor.  Have a firm chair that has side arms. You can use this for support while you get dressed.  Do not have throw rugs and other things on the floor that can make you trip. What can I do in the kitchen?  Clean up any spills right away.  Avoid walking on wet floors.  Keep items that you use a lot in easy-to-reach places.  If you need to reach something above you, use a strong step stool that has a grab bar.  Keep electrical cords out of the way.  Do not use floor polish or wax that makes floors slippery. If you must use wax, use non-skid floor wax.  Do not have throw rugs and other things on the floor that can make you trip. What can I do with my stairs?  Do not leave any items on the stairs.  Make sure that there are handrails on both sides of the stairs and use them. Fix handrails that are broken or loose. Make sure that handrails are as long as the stairways.  Check any carpeting to make sure that it is firmly attached to the stairs. Fix any carpet that is loose or worn.  Avoid having throw rugs at the top or bottom of the stairs. If you do have throw rugs, attach them to the floor with carpet tape.  Make sure that you have a light switch at the top of the stairs and the bottom of the stairs. If you do not have them, ask someone to add them for you. What else can I do to help prevent falls?  Wear shoes that:  Do not have high heels.  Have rubber bottoms.  Are comfortable and fit you well.  Are  closed at the toe. Do not wear sandals.  If you use a stepladder:  Make sure that it is fully opened. Do not climb a closed stepladder.  Make sure that both sides of the stepladder are locked into place.  Ask someone to hold it for you, if possible.  Clearly mark and make sure that you can see:  Any grab bars or handrails.  First and last steps.  Where the edge of each step is.  Use tools that help you move around (mobility aids) if they are needed. These include:  Canes.  Walkers.  Scooters.  Crutches.  Turn on the lights when you go into a dark area. Replace any light bulbs as soon as they burn out.  Set up your furniture so you have a clear path. Avoid moving your furniture around.  If any of your floors are uneven, fix them.  If there are any pets around you, be aware of where they are.  Review your medicines with your doctor. Some medicines can make you feel dizzy. This can increase your chance of falling. Ask your doctor what other things that you can do to help prevent falls. This information is not intended to replace advice given to you by your health care provider. Make sure you discuss any questions you have with your health care provider. Document Released: 05/07/2009 Document Revised: 12/17/2015 Document Reviewed: 08/15/2014 Elsevier Interactive Patient Education  2017 [employer].

## 2018-12-31 NOTE — Progress Notes (Signed)
Subjective:   Mary LeitzRhonda G [name] is a 73 y.o. female who presents for Medicare Annual (Subsequent) preventive examination.    This visit is being conducted through telemedicine due to the COVID-19 pandemic. This patient has given me verbal consent via doximity to conduct this visit, patient states they are participating from their home address. Some vital signs may be absent or patient reported.    Patient identification: identified by name, DOB, and current address  Review of Systems:  N/A  Cardiac Risk Factors include: advanced age (>1455men, 10>65 women);diabetes mellitus;dyslipidemia;hypertension;obesity (BMI >30kg/m2)     Objective:     Vitals: There were no vitals taken for this visit.  There is no height or weight on file to calculate BMI. Unable to obtain vitals due to visit being conducted via telephonically.   Advanced Directives 12/31/2018 09/17/2018 09/05/2018 10/10/2017 10/06/2016 03/18/2016 10/06/2015  Does Patient Have a Medical Advance Directive? Yes No No No No No No  Type of Estate agentAdvance Directive Healthcare Power of RoundupAttorney;Living will - - - - - -  Copy of Healthcare Power of Attorney in Chart? No - copy requested - - - - - -  Would patient like information on creating a medical advance directive? - Yes (Inpatient - patient requests chaplain consult to create a medical advance directive) Yes (MAU/Ambulatory/Procedural Areas - Information given) Yes (MAU/Ambulatory/Procedural Areas - Information given) - - -    Tobacco Social History   Tobacco Use  Smoking Status Never Smoker  Smokeless Tobacco Never Used     Counseling given: Not Answered   Clinical Intake:  Pre-visit preparation completed: Yes  Pain : No/denies pain Pain Score: 0-No pain     Nutritional Status: BMI > 30  Obese Nutritional Risks: None Diabetes: Yes  How often do you need to have someone help you when you read instructions, pamphlets, or other written materials from your doctor or pharmacy?: 1 -  Never   Diabetes:  Is the patient diabetic?  Yes type 1 If diabetic, was a CBG obtained today?  No  Did the patient bring in their glucometer from home?  No  How often do you monitor your CBG's? Four times daily.   Financial Strains and Diabetes Management:  Are you having any financial strains with the device, your supplies or your medication? No .  Does the patient want to be seen by Chronic Care Management for management of their diabetes?  No  Would the patient like to be referred to a Nutritionist or for Diabetic Management? Yes, referral sent to nutritionist today.   Diabetic Exams:  Diabetic Eye Exam: Completed 05/21/18. Complete yearly.   Diabetic Foot Exam: Completed 07/03/18. Completed yearly.    Interpreter Needed?: No  Information entered by :: [facility], LPN  Past Medical History:  Diagnosis Date  . Arthritis    degenerative arthritis left knee  . Carpal tunnel syndrome, bilateral   . Chronic kidney disease    stage 3  . Depression   . Diabetes mellitus without complication (HCC)   . 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 History of shingles   . Hyperlipidemia   . Hypertension   . Hypothyroidism   . Obesity    Past Surgical History:  Procedure Laterality Date  . CATARACT EXTRACTION Bilateral 2015  . CESAREAN SECTION  1980  . COLONOSCOPY WITH PROPOFOL N/A 03/18/2016   redundant colon, otherwise normal repeat 10 years.  Surgeon: Lollie Sails, MD;  Location: [facility] ENDOSCOPY;  . CYST EXCISION  2017   back of neck  . EYE SURGERY    . EYE SURGERY Right 01/20/2017   laser surgery for diabetic retinopathy  . 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 Lupus Mother   . Hypertension  Father   . Diabetes Father   . Heart attack Father   . Diabetes Sister   . Diabetes Sister   . Hypertension Sister   . Hypertension Sister   . Breast cancer Neg Hx    Social History   Socioeconomic History  . Marital status: Divorced    Spouse name: Not on file  . Number of children: 2  . Years of education: Not on file  . Highest education level: Some college, no degree  Occupational History  . Occupation: retired  Scientific laboratory technician  . Financial resource strain: Not hard at all  . Food insecurity:    Worry: Never true    Inability: Never true  . Transportation needs:    Medical: No    Non-medical: No  Tobacco Use  . Smoking status: Never Smoker  . Smokeless tobacco: Never Used  Substance and Sexual Activity  . Alcohol use: Yes    Alcohol/week: 0.0 - 2.0 standard drinks  . Drug use: No  . Sexual activity: Never  Lifestyle  . Physical activity:    Days per week: 0 days    Minutes per session: 0 min  . Stress: Not at all  Relationships  . Social connections:    Talks on phone: Patient refused    Gets together: Patient refused    Attends religious service: Patient refused    Active member of club or organization: Patient refused    Attends meetings of clubs or organizations: Patient refused    Relationship status: Patient refused  Other Topics Concern  . Not on file  Social History Narrative  . Not on file    Outpatient Encounter Medications as of 12/31/2018  Medication Sig  . ACCU-CHEK AVIVA PLUS test strip USE TEST STRIP(S) TO CHECK GLUCOSE 4 TIMES DAILY  . acetaminophen (TYLENOL) 650 MG CR tablet Take 1,300 mg by mouth every 8 (eight) hours as needed for pain.  Marland Kitchen atorvastatin (LIPITOR) 10 MG tablet Take 1 tablet by mouth once daily  . carvedilol (COREG) 12.5 MG tablet Take 2 tablets by mouth twice daily  . FLUoxetine (PROZAC) 20 MG capsule TAKE 1 CAPSULE BY MOUTH ONCE DAILY (Patient taking differently: Take 20 mg by mouth daily. )  . furosemide (LASIX) 20 MG  tablet TAKE 1 TABLET BY MOUTH ONCE DAILY (Patient taking differently: Take 20 mg by mouth daily. )  . Insulin Glargine (LANTUS SOLOSTAR) 100 UNIT/ML Solostar Pen Inject 32 Units into the skin at bedtime.  . insulin lispro (HUMALOG KWIKPEN) 100 UNIT/ML KiwkPen Inject 2-10 Units into the skin 4 (four) times daily -  before meals and at bedtime.   . Insulin Pen Needle 31G X 8 MM MISC by Does not apply route.  Marland Kitchen levothyroxine (SYNTHROID, LEVOTHROID) 150 MCG tablet TAKE 1 TABLET BY MOUTH ONCE DAILY (Patient taking differently: Take 150 mcg by mouth daily before breakfast. )  . quinapril (ACCUPRIL) 20 MG tablet TAKE 1 TABLET BY MOUTH ONCE DAILY  . Vitamin D,  Ergocalciferol, (DRISDOL) 50000 units CAPS capsule TAKE 1 CAPSULE BY MOUTH ONCE A WEEK (Patient taking differently: Take 50,000 Units by mouth every 7 (seven) days. )  . enoxaparin (LOVENOX) 40 MG/0.4ML injection Inject 0.4 mLs (40 mg total) into the skin daily for 14 days. (Patient not taking: Reported on 12/31/2018)  . insulin aspart (NOVOLOG FLEXPEN) 100 UNIT/ML FlexPen Inject 4 Units into the skin 2 (two) times daily before lunch and supper.  Marland Kitchen oxyCODONE (OXY IR/ROXICODONE) 5 MG immediate release tablet Take 1 tablet (5 mg total) by mouth every 4 (four) hours as needed for moderate pain (pain score 4-6). (Patient not taking: Reported on 12/31/2018)  . traMADol (ULTRAM) 50 MG tablet Take 1-2 tablets (50-100 mg total) by mouth every 6 (six) hours as needed for moderate pain. (Patient not taking: Reported on 12/31/2018)   No facility-administered encounter medications on file as of 12/31/2018.     Activities of Daily Living In your present state of health, do you have any difficulty performing the following activities: 12/31/2018 09/17/2018  Hearing? N N  Vision? N N  Comment Wears eye glasses daily.  -  Difficulty concentrating or making decisions? N N  Walking or climbing stairs? N Y  Comment - -  Dressing or bathing? N N  Doing errands, shopping? N N   Preparing Food and eating ? N -  Using the Toilet? N -  In the past six months, have you accidently leaked urine? Y -  Comment Occasionally, wears protection daily.  -  Do you have problems with loss of bowel control? N -  Managing your Medications? N -  Managing your Finances? N -  Housekeeping or managing your Housekeeping? N -  Some recent data might be hidden    Patient Care Team: Maple Hudson., MD as PCP - General (Family Medicine) Lamont Dowdy, MD as Consulting Physician (Internal Medicine) Solum, Marlana Salvage, MD as Physician Assistant (Endocrinology) Ernest Pine, Illene Labrador, MD (Orthopedic Surgery) Pa, [facility] (Optometry)    Assessment:   This is a routine wellness examination for [place].  Exercise Activities and Dietary recommendations Current Exercise Habits: Home exercise routine, Type of exercise: Other - see comments(rides a recumbent bike), Time (Minutes): 15, Frequency (Times/Week): 3(to 4 days a week), Weekly Exercise (Minutes/Week): 45, Intensity: Mild, Exercise limited by: orthopedic condition(s)  Goals    . Cut out extra servings     Recommend to continue current diet plan of cutting out carbohydrates and sugars in daily diet.        Fall Risk: Fall Risk  12/31/2018 10/10/2017 10/06/2016 10/06/2015  Falls in the past year? 0 Yes No No  Number falls in past yr: - 2 or more - -  Injury with Fall? - No - -  Risk for fall due to : - Impaired balance/gait;Other (Comment) - -  Risk for fall due to: Comment - diabetic neuropathy - -  Follow up - Falls prevention discussed - -    FALL RISK PREVENTION PERTAINING TO THE HOME:  Any stairs in or around the home? Yes  If so, are there any without handrails? Yes   Home free of loose throw rugs in walkways, pet beds, electrical cords, etc? Yes  Adequate lighting in your home to reduce risk of falls? Yes   ASSISTIVE DEVICES UTILIZED TO PREVENT FALLS:  Life alert? No  Use of a cane, walker or w/c? Yes  Grab  bars in the bathroom? Yes  Shower chair or bench  in shower? No  Elevated toilet seat or a handicapped toilet? Yes   TIMED UP AND GO:  Was the test performed? No .    Depression Screen PHQ 2/9 Scores 12/31/2018 12/31/2018 10/10/2017 10/06/2016  PHQ - 2 Score 0 0 1 0  PHQ- 9 Score 3 - - 2     Cognitive Function: Declined today.      6CIT Screen 10/06/2016  What Year? 0 points  What month? 0 points  What time? 0 points  Count back from 20 0 points  Months in reverse 0 points  Repeat phrase 2 points  Total Score 2    Immunization History  Administered Date(s) Administered  . Influenza, High Dose Seasonal PF 04/08/2015, 04/22/2016, 05/26/2018  . Pneumococcal Conjugate-13 06/03/2014  . Pneumococcal Polysaccharide-23 06/06/2012  . Tdap 12/06/2012  . Zoster 12/06/2012    Qualifies for Shingles Vaccine? Yes  Zostavax completed 12/06/12. Due for Shingrix. Education has been provided regarding the importance of this vaccine. Pt has been advised to call insurance company to determine out of pocket expense. Advised may also receive vaccine at local pharmacy or Health Dept. Verbalized acceptance and understanding.  Tdap: Up to date  Flu Vaccine: Up to date  Pneumococcal Vaccine: Completed series  Screening Tests Health Maintenance  Topic Date Due  . DEXA SCAN  03/09/2016  . MAMMOGRAM  01/12/2019  . INFLUENZA VACCINE  02/23/2019  . HEMOGLOBIN A1C  03/06/2019  . OPHTHALMOLOGY EXAM  05/22/2019  . FOOT EXAM  07/04/2019  . TETANUS/TDAP  12/07/2022  . COLONOSCOPY  03/18/2026  . Hepatitis C Screening  Completed  . PNA vac Low Risk Adult  Completed    Cancer Screenings:  Colorectal Screening: Completed 03/18/16. Repeat every 10 years.  Mammogram: Completed 01/11/17.   Bone Density: Completed 03/10/11. Results reflect OSTEOPENIA. Repeat every 5 years. Ordered today. Pt provided with contact info and advised to call to schedule appt. Pt aware the office will call re: appt.  Lung  Cancer Screening: (Low Dose CT Chest recommended if Age 5-80 years, 30 pack-year currently smoking OR have quit w/in 15years.) does not qualify.   Additional Screening:  Hepatitis C Screening: Up to date  Dental Screening: Recommended annual dental exams for proper oral hygiene   Community Resource Referral:  CRR required this visit?  No       Plan:  I have personally reviewed and addressed the Medicare Annual Wellness questionnaire and have noted the following in the patient's chart:  A. Medical and social history B. Use of alcohol, tobacco or illicit drugs  C. Current medications and supplements D. Functional ability and status E.  Nutritional status F.  Physical activity G. Advance directives H. List of other physicians I.  Hospitalizations, surgeries, and ER visits in previous 12 months J.  Vitals K. Screenings such as hearing and vision if needed, cognitive and depression L. Referrals and appointments   In addition, I have reviewed and discussed with patient certain preventive protocols, quality metrics, and best practice recommendations. A written personalized care plan for preventive services as well as general preventive health recommendations were provided to patient.   Darrick HuntsmanSigned,    Orlando Thalmann, LPN  9/6/04546/02/2019 Nurse Health Advisor   Nurse Notes: None.

## 2019-01-17 ENCOUNTER — Other Ambulatory Visit: Payer: Self-pay | Admitting: Family Medicine

## 2019-01-26 DIAGNOSIS — E1065 Type 1 diabetes mellitus with hyperglycemia: Secondary | ICD-10-CM | POA: Diagnosis not present

## 2019-02-07 ENCOUNTER — Other Ambulatory Visit: Payer: Self-pay | Admitting: Family Medicine

## 2019-02-25 ENCOUNTER — Other Ambulatory Visit: Payer: Medicare Other

## 2019-02-25 DIAGNOSIS — E1065 Type 1 diabetes mellitus with hyperglycemia: Secondary | ICD-10-CM | POA: Diagnosis not present

## 2019-03-13 DIAGNOSIS — I10 Essential (primary) hypertension: Secondary | ICD-10-CM | POA: Diagnosis not present

## 2019-03-13 DIAGNOSIS — I129 Hypertensive chronic kidney disease with stage 1 through stage 4 chronic kidney disease, or unspecified chronic kidney disease: Secondary | ICD-10-CM | POA: Diagnosis not present

## 2019-03-13 DIAGNOSIS — R809 Proteinuria, unspecified: Secondary | ICD-10-CM | POA: Diagnosis not present

## 2019-03-13 DIAGNOSIS — N183 Chronic kidney disease, stage 3 (moderate): Secondary | ICD-10-CM | POA: Diagnosis not present

## 2019-03-27 DIAGNOSIS — N2581 Secondary hyperparathyroidism of renal origin: Secondary | ICD-10-CM | POA: Diagnosis not present

## 2019-03-27 DIAGNOSIS — N183 Chronic kidney disease, stage 3 (moderate): Secondary | ICD-10-CM | POA: Diagnosis not present

## 2019-03-27 DIAGNOSIS — I129 Hypertensive chronic kidney disease with stage 1 through stage 4 chronic kidney disease, or unspecified chronic kidney disease: Secondary | ICD-10-CM | POA: Diagnosis not present

## 2019-03-27 DIAGNOSIS — R809 Proteinuria, unspecified: Secondary | ICD-10-CM | POA: Diagnosis not present

## 2019-04-03 ENCOUNTER — Ambulatory Visit
Admission: RE | Admit: 2019-04-03 | Discharge: 2019-04-03 | Disposition: A | Discharge status: Home / Self Care | Payer: Medicare Other | Source: Ambulatory Visit | Attending: Family Medicine | Admitting: Family Medicine

## 2019-04-03 DIAGNOSIS — M85851 Other specified disorders of bone density and structure, right thigh: Secondary | ICD-10-CM | POA: Insufficient documentation

## 2019-04-03 DIAGNOSIS — Z780 Asymptomatic menopausal state: Secondary | ICD-10-CM | POA: Diagnosis not present

## 2019-04-03 DIAGNOSIS — Z13820 Encounter for screening for osteoporosis: Secondary | ICD-10-CM | POA: Insufficient documentation

## 2019-04-10 DIAGNOSIS — E1022 Type 1 diabetes mellitus with diabetic chronic kidney disease: Secondary | ICD-10-CM | POA: Diagnosis not present

## 2019-04-10 DIAGNOSIS — N183 Chronic kidney disease, stage 3 (moderate): Secondary | ICD-10-CM | POA: Diagnosis not present

## 2019-04-16 ENCOUNTER — Ambulatory Visit (INDEPENDENT_AMBULATORY_CARE_PROVIDER_SITE_OTHER): Payer: Medicare Other

## 2019-04-16 ENCOUNTER — Other Ambulatory Visit: Payer: Self-pay

## 2019-04-16 DIAGNOSIS — Z23 Encounter for immunization: Secondary | ICD-10-CM | POA: Diagnosis not present

## 2019-04-17 DIAGNOSIS — E1042 Type 1 diabetes mellitus with diabetic polyneuropathy: Secondary | ICD-10-CM | POA: Diagnosis not present

## 2019-04-17 DIAGNOSIS — E1022 Type 1 diabetes mellitus with diabetic chronic kidney disease: Secondary | ICD-10-CM | POA: Diagnosis not present

## 2019-04-17 DIAGNOSIS — N183 Chronic kidney disease, stage 3 (moderate): Secondary | ICD-10-CM | POA: Diagnosis not present

## 2019-04-17 DIAGNOSIS — E103313 Type 1 diabetes mellitus with moderate nonproliferative diabetic retinopathy with macular edema, bilateral: Secondary | ICD-10-CM | POA: Diagnosis not present

## 2019-04-25 IMAGING — DX DG KNEE 1-2V PORT*L*
2 series · 2 of 2 positions shown · non-contrast
Comparison: None.

CLINICAL DATA: 72-year-old female post total knee replacement.
Initial encounter.

EXAM:
PORTABLE LEFT KNEE - 1-2 VIEW

[knee ap]
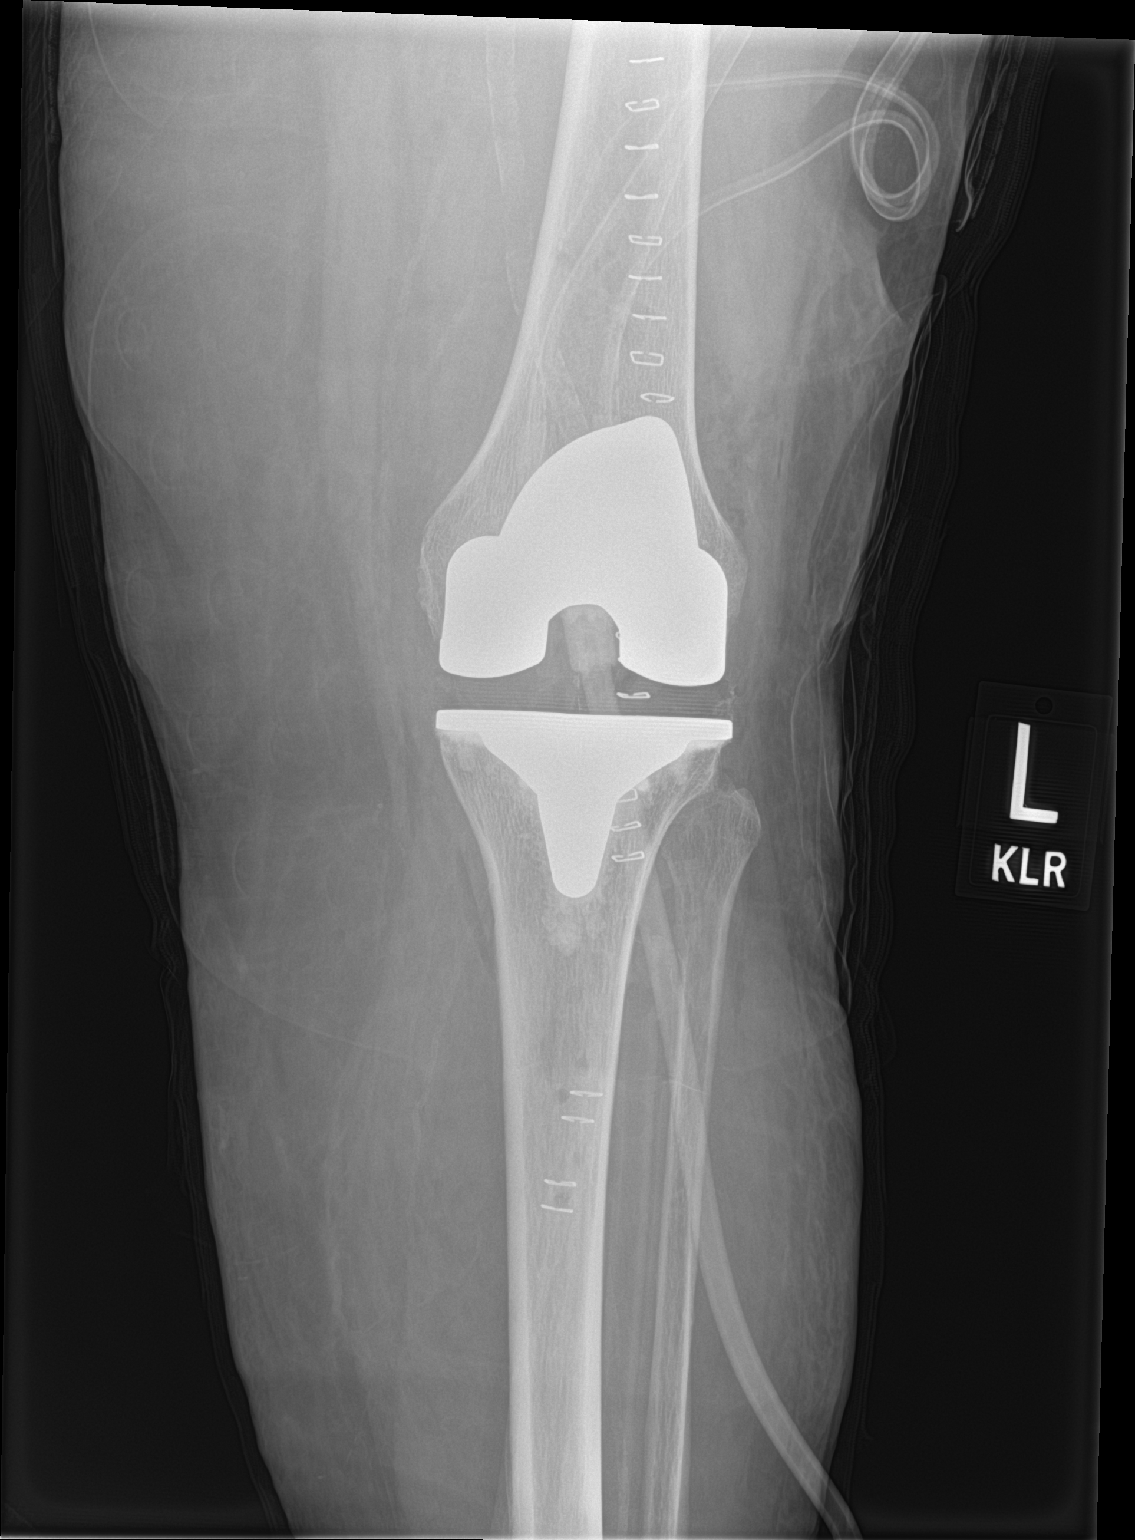

[knee lat]
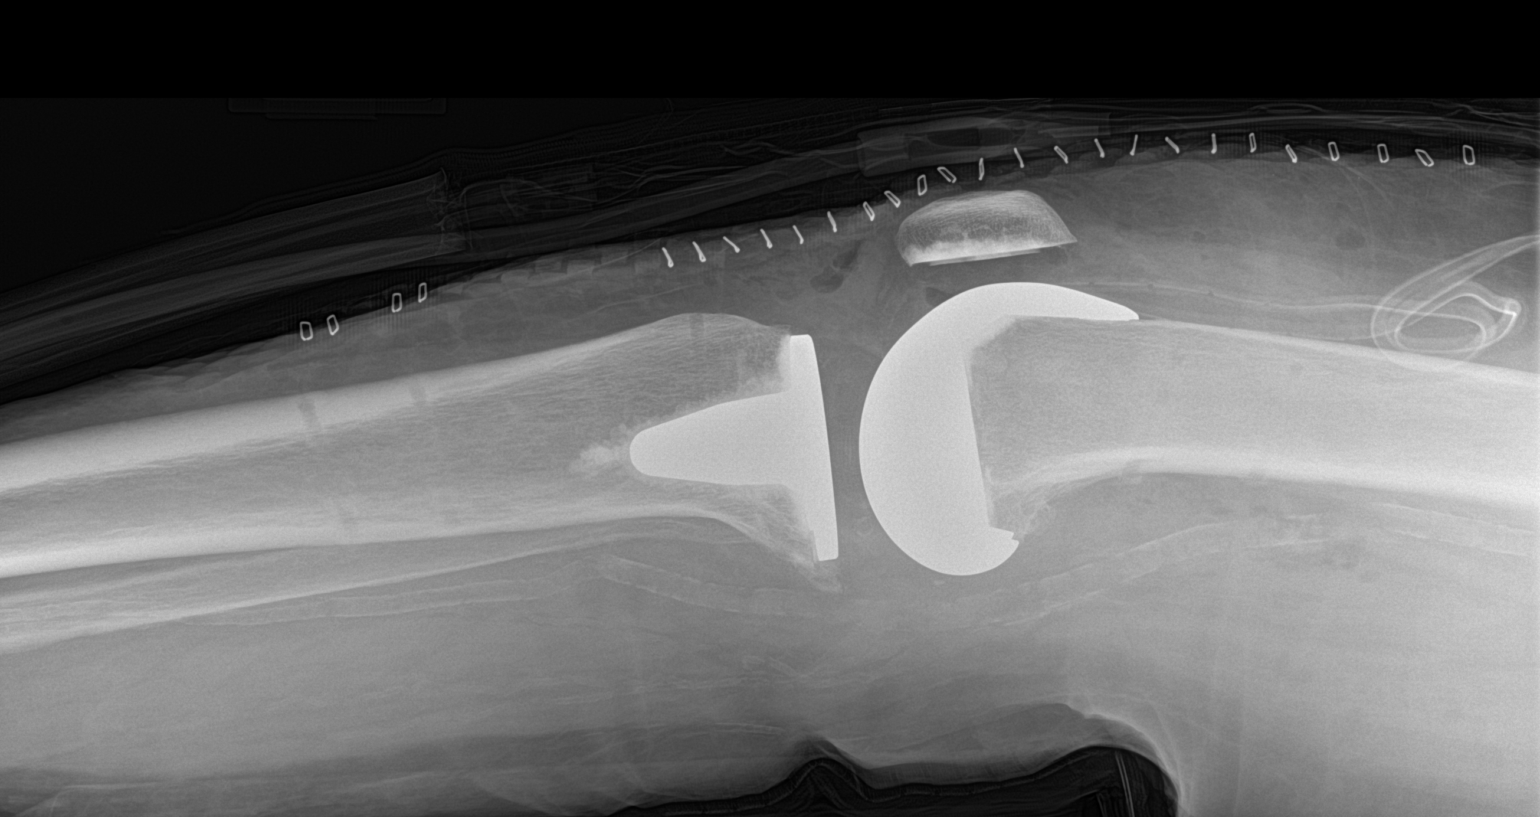

[2 of 2 positions shown; findings below may reference images not displayed]

FINDINGS: Post total left knee replacement which appears in satisfactory
position without complication noted.

Pin tracks distal femur and proximal tibia.

Vascular calcifications.

Drain in place.
IMPRESSION: Post total left knee replacement.

## 2019-04-29 ENCOUNTER — Encounter: Payer: Self-pay | Admitting: Family Medicine

## 2019-04-29 ENCOUNTER — Ambulatory Visit (INDEPENDENT_AMBULATORY_CARE_PROVIDER_SITE_OTHER): Payer: Medicare Other | Admitting: Family Medicine

## 2019-04-29 ENCOUNTER — Other Ambulatory Visit: Payer: Self-pay

## 2019-04-29 VITALS — BP 128/62 | HR 60 | Temp 96.9°F | Wt 220.0 lb

## 2019-04-29 DIAGNOSIS — R202 Paresthesia of skin: Secondary | ICD-10-CM

## 2019-04-29 DIAGNOSIS — I10 Essential (primary) hypertension: Secondary | ICD-10-CM

## 2019-04-29 DIAGNOSIS — Z6837 Body mass index (BMI) 37.0-37.9, adult: Secondary | ICD-10-CM

## 2019-04-29 DIAGNOSIS — E7849 Other hyperlipidemia: Secondary | ICD-10-CM

## 2019-04-29 DIAGNOSIS — E1022 Type 1 diabetes mellitus with diabetic chronic kidney disease: Secondary | ICD-10-CM

## 2019-04-29 DIAGNOSIS — E039 Hypothyroidism, unspecified: Secondary | ICD-10-CM

## 2019-04-29 DIAGNOSIS — E1021 Type 1 diabetes mellitus with diabetic nephropathy: Secondary | ICD-10-CM

## 2019-04-29 DIAGNOSIS — N1832 Chronic kidney disease, stage 3b: Secondary | ICD-10-CM

## 2019-04-29 DIAGNOSIS — G629 Polyneuropathy, unspecified: Secondary | ICD-10-CM

## 2019-04-29 NOTE — Patient Instructions (Signed)
Try Alpha Lipoic Acid Daily.

## 2019-04-29 NOTE — Progress Notes (Signed)
Patient: Mary [name] Female    DOB: 03-15-1946   73 y.o.   MRN: [id number] Visit Date: 04/29/2019  Today's Provider: [name], MD   Chief Complaint  Patient presents with  . Hypertension  . Hypothyroidism  . Numbness    Bilateral hands and feet   Subjective:     Thyroid Problem Presents for follow-up visit. Symptoms include cold intolerance and dry skin. Patient reports no diaphoresis, diarrhea, fatigue, hair loss, heat intolerance, hoarse voice, nail problem, palpitations, tremors, weight gain or weight loss.  Hypertension This is a chronic problem. The problem is unchanged. Pertinent negatives include no palpitations. There are no compliance problems.  Identifiable causes of hypertension include a thyroid problem.   Regarding her neuropathy she had side effects with the gabapentin.  She felt like she could not tolerate it.  Her diabetes is doing well followed by Dr. Gabriel Carina.  BP Readings from Last 3 Encounters:  04/29/19 128/62  09/19/18 (!) 156/60  09/05/18 (!) 148/63    Lab Results  Component Value Date   TSH 1.100 06/13/2018   Wt Readings from Last 3 Encounters:  04/29/19 220 lb (99.8 kg)  09/17/18 217 lb (98.4 kg)  09/05/18 217 lb (98.4 kg)      Allergies  Allergen Reactions  . Oxycodone Hcl Itching  . Hydrocodone Itching     Current Outpatient Medications:  .  ACCU-CHEK AVIVA PLUS test strip, USE TEST STRIP(S) TO CHECK GLUCOSE 4 TIMES DAILY, Disp: , Rfl:  .  acetaminophen (TYLENOL) 650 MG CR tablet, Take 1,300 mg by mouth every 8 (eight) hours as needed for pain., Disp: , Rfl:  .  atorvastatin (LIPITOR) 10 MG tablet, Take 1 tablet by mouth once daily, Disp: 90 tablet, Rfl: 1 .  carvedilol (COREG) 12.5 MG tablet, Take 2 tablets by mouth twice daily, Disp: 360 tablet, Rfl: 1 .  FLUoxetine (PROZAC) 20 MG capsule, Take 1 capsule (20 mg total) by mouth daily., Disp: 30 capsule, Rfl: 1 .  furosemide (LASIX) 20 MG tablet, TAKE 1 TABLET BY  MOUTH ONCE DAILY (Patient taking differently: Take 20 mg by mouth daily. ), Disp: 90 tablet, Rfl: 3 .  Insulin Glargine (LANTUS SOLOSTAR) 100 UNIT/ML Solostar Pen, Inject 32 Units into the skin at bedtime., Disp: , Rfl:  .  insulin lispro (HUMALOG KWIKPEN) 100 UNIT/ML KiwkPen, Inject 2-10 Units into the skin 4 (four) times daily -  before meals and at bedtime. , Disp: , Rfl:  .  Insulin Pen Needle 31G X 8 MM MISC, by Does not apply route., Disp: , Rfl:  .  levothyroxine (SYNTHROID, LEVOTHROID) 150 MCG tablet, TAKE 1 TABLET BY MOUTH ONCE DAILY (Patient taking differently: Take 150 mcg by mouth daily before breakfast. ), Disp: 90 tablet, Rfl: 2 .  quinapril (ACCUPRIL) 20 MG tablet, TAKE 1 TABLET BY MOUTH ONCE DAILY, Disp: 90 tablet, Rfl: 3 .  Vitamin D, Ergocalciferol, (DRISDOL) 1.25 MG (50000 UT) CAPS capsule, Take 1 capsule by mouth once a week, Disp: 4 capsule, Rfl: 11 .  enoxaparin (LOVENOX) 40 MG/0.4ML injection, Inject 0.4 mLs (40 mg total) into the skin daily for 14 days. (Patient not taking: Reported on 12/31/2018), Disp: 14 Syringe, Rfl: 0 .  insulin aspart (NOVOLOG FLEXPEN) 100 UNIT/ML FlexPen, Inject 4 Units into the skin 2 (two) times daily before lunch and supper., Disp: , Rfl:  .  oxyCODONE (OXY IR/ROXICODONE) 5 MG immediate release tablet, Take 1 tablet (5 mg total) by  mouth every 4 (four) hours as needed for moderate pain (pain score 4-6). (Patient not taking: Reported on 12/31/2018), Disp: 30 tablet, Rfl: 0 .  traMADol (ULTRAM) 50 MG tablet, Take 1-2 tablets (50-100 mg total) by mouth every 6 (six) hours as needed for moderate pain. (Patient not taking: Reported on 12/31/2018), Disp: 60 tablet, Rfl: 0  Review of Systems  Constitutional: Negative.  Negative for diaphoresis, fatigue, weight gain and weight loss.  HENT: Negative for hoarse voice.   Respiratory: Negative.   Cardiovascular: Negative.  Negative for palpitations.  Gastrointestinal: Negative for diarrhea.  Endocrine: Positive for  cold intolerance. Negative for heat intolerance, polydipsia, polyphagia and polyuria.  Musculoskeletal: Negative.   Allergic/Immunologic: Negative.   Neurological: Negative for tremors, weakness and numbness.  Hematological: Negative.   Psychiatric/Behavioral: Negative.     Social History   Tobacco Use  . Smoking status: Never Smoker  . Smokeless tobacco: Never Used  Substance Use Topics  . Alcohol use: Yes    Alcohol/week: 0.0 - 2.0 standard drinks      Objective:   BP 128/62 (BP Location: Right Arm, Patient Position: Sitting, Cuff Size: Large)   Pulse 60   Temp (!) 96.9 F (36.1 C) (Temporal)   Wt 220 lb (99.8 kg)   SpO2 98%   BMI 37.76 kg/m  Vitals:   04/29/19 1439  BP: 128/62  Pulse: 60  Temp: (!) 96.9 F (36.1 C)  TempSrc: Temporal  SpO2: 98%  Weight: 220 lb (99.8 kg)  Body mass index is 37.76 kg/m.   Physical Exam Vitals signs reviewed.  Constitutional:      Appearance: She is well-developed. She is obese.  HENT:     Head: Normocephalic and atraumatic.  Eyes:     General: No scleral icterus.    Conjunctiva/sclera: Conjunctivae normal.  Cardiovascular:     Rate and Rhythm: Normal rate and regular rhythm.     Heart sounds: Normal heart sounds.  Pulmonary:     Effort: Pulmonary effort is normal.     Breath sounds: Normal breath sounds.  Abdominal:     Palpations: Abdomen is soft.  Musculoskeletal:     Comments: Trace pedal edema--left greater than right.  Skin:    General: Skin is warm and dry.  Neurological:     Mental Status: She is alert and oriented to person, place, and time.  Psychiatric:        Mood and Affect: Mood normal.        Behavior: Behavior normal.        Thought Content: Thought content normal.        Judgment: Judgment normal.      No results found for any visits on 04/29/19.     Assessment & Plan    1. Essential hypertension On quinapril and Coreg - CBC with Differential/Platelet - Comprehensive metabolic  panel  2. Hypothyroidism, unspecified type  - CBC with Differential/Platelet - TSH  3. Other hyperlipidemia On atorvastatin - CBC with Differential/Platelet - Lipid panel  4. Paresthesia  - Vitamin B12  5. Neuropathy Try alpha lipoic  acid - Vitamin B12  6. 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  7. Class 2 severe obesity due to excess calories with serious comorbidity and body mass index (BMI) of 37.0 to 37.9 in adult [facility]) With diabetes and hypertension     [name], MD  [facility]

## 2019-05-01 DIAGNOSIS — G629 Polyneuropathy, unspecified: Secondary | ICD-10-CM | POA: Diagnosis not present

## 2019-05-01 DIAGNOSIS — E039 Hypothyroidism, unspecified: Secondary | ICD-10-CM | POA: Diagnosis not present

## 2019-05-01 DIAGNOSIS — R202 Paresthesia of skin: Secondary | ICD-10-CM | POA: Diagnosis not present

## 2019-05-01 DIAGNOSIS — I10 Essential (primary) hypertension: Secondary | ICD-10-CM | POA: Diagnosis not present

## 2019-05-02 ENCOUNTER — Other Ambulatory Visit: Payer: Self-pay | Admitting: Family Medicine

## 2019-05-02 ENCOUNTER — Telehealth: Payer: Self-pay

## 2019-05-02 DIAGNOSIS — E039 Hypothyroidism, unspecified: Secondary | ICD-10-CM

## 2019-05-02 LAB — CBC WITH DIFFERENTIAL/PLATELET
Basophils Absolute: 0 10*3/uL (ref 0.0–0.2)
Basos: 1 %
EOS (ABSOLUTE): 0.2 10*3/uL (ref 0.0–0.4)
Eos: 3 %
Hematocrit: 36.2 % (ref 34.0–46.6)
Hemoglobin: 11.8 g/dL (ref 11.1–15.9)
Immature Grans (Abs): 0 10*3/uL (ref 0.0–0.1)
Immature Granulocytes: 0 %
Lymphocytes Absolute: 2.7 10*3/uL (ref 0.7–3.1)
Lymphs: 39 %
MCH: 29.9 pg (ref 26.6–33.0)
MCHC: 32.6 g/dL (ref 31.5–35.7)
MCV: 92 fL (ref 79–97)
Monocytes Absolute: 0.7 10*3/uL (ref 0.1–0.9)
Monocytes: 10 %
Neutrophils Absolute: 3.3 10*3/uL (ref 1.4–7.0)
Neutrophils: 47 %
Platelets: 172 10*3/uL (ref 150–450)
RBC: 3.95 x10E6/uL (ref 3.77–5.28)
RDW: 13.1 % (ref 11.7–15.4)
WBC: 7 10*3/uL (ref 3.4–10.8)

## 2019-05-02 LAB — COMPREHENSIVE METABOLIC PANEL
ALT: 10 IU/L (ref 0–32)
AST: 20 IU/L (ref 0–40)
Albumin/Globulin Ratio: 1.2 (ref 1.2–2.2)
Albumin: 3.7 g/dL (ref 3.7–4.7)
Alkaline Phosphatase: 101 IU/L (ref 39–117)
BUN/Creatinine Ratio: 27 (ref 12–28)
BUN: 43 mg/dL — ABNORMAL HIGH (ref 8–27)
Bilirubin Total: 0.4 mg/dL (ref 0.0–1.2)
CO2: 23 mmol/L (ref 20–29)
Calcium: 9.7 mg/dL (ref 8.7–10.3)
Chloride: 106 mmol/L (ref 96–106)
Creatinine, Ser: 1.58 mg/dL — ABNORMAL HIGH (ref 0.57–1.00)
GFR calc Af Amer: 37 mL/min/{1.73_m2} — ABNORMAL LOW (ref >59)
GFR calc non Af Amer: 32 mL/min/{1.73_m2} — ABNORMAL LOW (ref >59)
Globulin, Total: 3.1 g/dL (ref 1.5–4.5)
Glucose: 92 mg/dL (ref 65–99)
Potassium: 4.5 mmol/L (ref 3.5–5.2)
Sodium: 143 mmol/L (ref 134–144)
Total Protein: 6.8 g/dL (ref 6.0–8.5)

## 2019-05-02 LAB — LIPID PANEL
Chol/HDL Ratio: 2.3 ratio (ref 0.0–4.4)
Cholesterol, Total: 171 mg/dL (ref 100–199)
HDL: 73 mg/dL (ref >39)
LDL Chol Calc (NIH): 87 mg/dL (ref 0–99)
Triglycerides: 52 mg/dL (ref 0–149)
VLDL Cholesterol Cal: 11 mg/dL (ref 5–40)

## 2019-05-02 LAB — VITAMIN B12: Vitamin B-12: 261 pg/mL (ref 232–1245)

## 2019-05-02 LAB — TSH: TSH: 1.31 u[IU]/mL (ref 0.450–4.500)

## 2019-05-02 NOTE — Telephone Encounter (Signed)
OTC B12 daily.

## 2019-05-02 NOTE — Telephone Encounter (Signed)
-----   Message from Jerrol Banana., MD sent at 05/02/2019  2:59 PM EDT ----- I cannot find how the patient is taking B12 supplement.  Is it shot or is it by mouth.?.  Otherwise labs are stable other than decreased kidney function which has been present for more than a year.  Work on staying hydrated and avoid any anti-inflammatory drugs such as Advil or Aleve.

## 2019-05-02 NOTE — Telephone Encounter (Signed)
Patient states that she is not taking any Vitamin B 12. Notified of lab results.

## 2019-05-03 NOTE — Telephone Encounter (Signed)
Pt advised.   Thanks,   -Mazie Fencl  

## 2019-05-20 DIAGNOSIS — E103511 Type 1 diabetes mellitus with proliferative diabetic retinopathy with macular edema, right eye: Secondary | ICD-10-CM | POA: Diagnosis not present

## 2019-05-20 LAB — HM DIABETES EYE EXAM

## 2019-05-22 ENCOUNTER — Encounter: Payer: Self-pay | Admitting: *Deleted

## 2019-05-24 DIAGNOSIS — E103593 Type 1 diabetes mellitus with proliferative diabetic retinopathy without macular edema, bilateral: Secondary | ICD-10-CM | POA: Insufficient documentation

## 2019-05-27 ENCOUNTER — Other Ambulatory Visit: Payer: Self-pay | Admitting: Family Medicine

## 2019-06-02 ENCOUNTER — Other Ambulatory Visit: Payer: Self-pay | Admitting: Family Medicine

## 2019-06-02 DIAGNOSIS — E7849 Other hyperlipidemia: Secondary | ICD-10-CM

## 2019-06-25 DIAGNOSIS — E1065 Type 1 diabetes mellitus with hyperglycemia: Secondary | ICD-10-CM | POA: Diagnosis not present

## 2019-06-27 ENCOUNTER — Other Ambulatory Visit: Payer: Self-pay | Admitting: Family Medicine

## 2019-07-02 DIAGNOSIS — H4312 Vitreous hemorrhage, left eye: Secondary | ICD-10-CM | POA: Diagnosis not present

## 2019-07-25 DIAGNOSIS — E1065 Type 1 diabetes mellitus with hyperglycemia: Secondary | ICD-10-CM | POA: Diagnosis not present

## 2019-08-13 ENCOUNTER — Other Ambulatory Visit: Payer: Self-pay | Admitting: Family Medicine

## 2019-08-13 DIAGNOSIS — E103313 Type 1 diabetes mellitus with moderate nonproliferative diabetic retinopathy with macular edema, bilateral: Secondary | ICD-10-CM | POA: Diagnosis not present

## 2019-08-13 NOTE — Telephone Encounter (Signed)
Requested Prescriptions  Pending Prescriptions Disp Refills  . FLUoxetine (PROZAC) 20 MG capsule [Pharmacy Med Name: FLUoxetine HCl 20 MG Oral Capsule] 30 capsule 0    Sig: Take 1 capsule by mouth once daily     Psychiatry:  Antidepressants - SSRI Passed - 08/13/2019  2:20 PM      Passed - Completed PHQ-2 or PHQ-9 in the last 360 days.      Passed - Valid encounter within last 6 months    Recent Outpatient Visits          3 months ago Essential hypertension   [facility] Maple Hudson., MD   1 year ago Essential hypertension   [facility] Maple Hudson., MD   1 year ago Swelling of both lower extremities   [facility] Maple Hudson., MD   1 year ago Swelling of both lower extremities   [facility] Maple Hudson., MD   1 year ago Essential hypertension   [facility] Maple Hudson., MD      Future Appointments            In 4 months [facility]

## 2019-08-14 ENCOUNTER — Telehealth: Payer: Self-pay | Admitting: Family Medicine

## 2019-08-15 NOTE — Telephone Encounter (Signed)
Void encounter.

## 2019-08-17 ENCOUNTER — Other Ambulatory Visit: Payer: Self-pay | Admitting: Family Medicine

## 2019-08-24 DIAGNOSIS — E1065 Type 1 diabetes mellitus with hyperglycemia: Secondary | ICD-10-CM | POA: Diagnosis not present

## 2019-08-28 DIAGNOSIS — N183 Chronic kidney disease, stage 3 unspecified: Secondary | ICD-10-CM | POA: Diagnosis not present

## 2019-08-28 DIAGNOSIS — I10 Essential (primary) hypertension: Secondary | ICD-10-CM | POA: Diagnosis not present

## 2019-08-28 DIAGNOSIS — R809 Proteinuria, unspecified: Secondary | ICD-10-CM | POA: Diagnosis not present

## 2019-08-28 DIAGNOSIS — N2581 Secondary hyperparathyroidism of renal origin: Secondary | ICD-10-CM | POA: Diagnosis not present

## 2019-09-01 ENCOUNTER — Other Ambulatory Visit: Payer: Self-pay | Admitting: Family Medicine

## 2019-09-01 DIAGNOSIS — M7989 Other specified soft tissue disorders: Secondary | ICD-10-CM

## 2019-09-01 NOTE — Telephone Encounter (Signed)
Requested Prescriptions  Pending Prescriptions Disp Refills  . furosemide (LASIX) 20 MG tablet [Pharmacy Med Name: Furosemide 20 MG Oral Tablet] 90 tablet 0    Sig: Take 1 tablet by mouth once daily     Cardiovascular:  Diuretics - Loop Failed - 09/01/2019 12:45 PM      Failed - Cr in normal range and within 360 days    Creatinine  Date Value Ref Range Status  07/11/2012 1.19 0.60 - 1.30 mg/dL Final   Creatinine, Ser  Date Value Ref Range Status  05/01/2019 1.58 (H) 0.57 - 1.00 mg/dL Final         Passed - K in normal range and within 360 days    Potassium  Date Value Ref Range Status  05/01/2019 4.5 3.5 - 5.2 mmol/L Final  07/11/2012 4.4 3.5 - 5.1 mmol/L Final         Passed - Ca in normal range and within 360 days    Calcium  Date Value Ref Range Status  05/01/2019 9.7 8.7 - 10.3 mg/dL Final   Calcium, Total  Date Value Ref Range Status  07/11/2012 9.8 8.5 - 10.1 mg/dL Final         Passed - Na in normal range and within 360 days    Sodium  Date Value Ref Range Status  05/01/2019 143 134 - 144 mmol/L Final  07/11/2012 139 136 - 145 mmol/L Final         Passed - Last BP in normal range    BP Readings from Last 1 Encounters:  04/29/19 128/62         Passed - Valid encounter within last 6 months    Recent Outpatient Visits          4 months ago Essential hypertension   [facility] Maple Hudson., MD   1 year ago Essential hypertension   [facility] Maple Hudson., MD   1 year ago Swelling of both lower extremities   [facility] Maple Hudson., MD   1 year ago Swelling of both lower extremities   [facility] Maple Hudson., MD   1 year ago Essential hypertension   [facility] Maple Hudson., MD      Future Appointments            In 4 months [facility]

## 2019-09-02 ENCOUNTER — Other Ambulatory Visit: Payer: Self-pay | Admitting: Family Medicine

## 2019-09-02 DIAGNOSIS — E7849 Other hyperlipidemia: Secondary | ICD-10-CM

## 2019-09-02 NOTE — Telephone Encounter (Signed)
Requested Prescriptions  Pending Prescriptions Disp Refills  . atorvastatin (LIPITOR) 10 MG tablet [Pharmacy Med Name: Atorvastatin Calcium 10 MG Oral Tablet] 90 tablet 0    Sig: Take 1 tablet by mouth once daily     Cardiovascular:  Antilipid - Statins Failed - 09/02/2019  1:26 PM      Failed - LDL in normal range and within 360 days    LDL Chol Calc (NIH)  Date Value Ref Range Status  05/01/2019 87 0 - 99 mg/dL Final         Passed - Total Cholesterol in normal range and within 360 days    Cholesterol, Total  Date Value Ref Range Status  05/01/2019 171 100 - 199 mg/dL Final         Passed - HDL in normal range and within 360 days    HDL  Date Value Ref Range Status  05/01/2019 73 >39 mg/dL Final         Passed - Triglycerides in normal range and within 360 days    Triglycerides  Date Value Ref Range Status  05/01/2019 52 0 - 149 mg/dL Final         Passed - Patient is not pregnant      Passed - Valid encounter within last 12 months    Recent Outpatient Visits          4 months ago Essential hypertension   [facility] Maple Hudson., MD   1 year ago Essential hypertension   [facility] Maple Hudson., MD   1 year ago Swelling of both lower extremities   [facility] Maple Hudson., MD   1 year ago Swelling of both lower extremities   [facility] Maple Hudson., MD   1 year ago Essential hypertension   [facility] Maple Hudson., MD      Future Appointments            In 4 months [facility]

## 2019-09-09 DIAGNOSIS — H26491 Other secondary cataract, right eye: Secondary | ICD-10-CM | POA: Diagnosis not present

## 2019-09-16 DIAGNOSIS — E113593 Type 2 diabetes mellitus with proliferative diabetic retinopathy without macular edema, bilateral: Secondary | ICD-10-CM | POA: Diagnosis not present

## 2019-09-17 DIAGNOSIS — M1712 Unilateral primary osteoarthritis, left knee: Secondary | ICD-10-CM | POA: Diagnosis not present

## 2019-09-17 DIAGNOSIS — Z96652 Presence of left artificial knee joint: Secondary | ICD-10-CM | POA: Diagnosis not present

## 2019-09-17 DIAGNOSIS — M65332 Trigger finger, left middle finger: Secondary | ICD-10-CM | POA: Diagnosis not present

## 2019-09-17 DIAGNOSIS — G5603 Carpal tunnel syndrome, bilateral upper limbs: Secondary | ICD-10-CM | POA: Diagnosis not present

## 2019-09-23 DIAGNOSIS — E1065 Type 1 diabetes mellitus with hyperglycemia: Secondary | ICD-10-CM | POA: Diagnosis not present

## 2019-09-25 DIAGNOSIS — N183 Chronic kidney disease, stage 3 unspecified: Secondary | ICD-10-CM | POA: Diagnosis not present

## 2019-09-25 DIAGNOSIS — R809 Proteinuria, unspecified: Secondary | ICD-10-CM | POA: Diagnosis not present

## 2019-09-25 DIAGNOSIS — R8290 Unspecified abnormal findings in urine: Secondary | ICD-10-CM | POA: Diagnosis not present

## 2019-09-25 DIAGNOSIS — I10 Essential (primary) hypertension: Secondary | ICD-10-CM | POA: Diagnosis not present

## 2019-09-27 ENCOUNTER — Ambulatory Visit: Payer: Medicare Other

## 2019-10-02 DIAGNOSIS — R809 Proteinuria, unspecified: Secondary | ICD-10-CM | POA: Insufficient documentation

## 2019-10-02 DIAGNOSIS — N1832 Chronic kidney disease, stage 3b: Secondary | ICD-10-CM | POA: Diagnosis not present

## 2019-10-02 DIAGNOSIS — E875 Hyperkalemia: Secondary | ICD-10-CM | POA: Insufficient documentation

## 2019-10-02 DIAGNOSIS — N2581 Secondary hyperparathyroidism of renal origin: Secondary | ICD-10-CM | POA: Insufficient documentation

## 2019-10-22 DIAGNOSIS — E103593 Type 1 diabetes mellitus with proliferative diabetic retinopathy without macular edema, bilateral: Secondary | ICD-10-CM | POA: Diagnosis not present

## 2019-10-23 DIAGNOSIS — E1065 Type 1 diabetes mellitus with hyperglycemia: Secondary | ICD-10-CM | POA: Diagnosis not present

## 2019-11-19 ENCOUNTER — Other Ambulatory Visit: Payer: Self-pay | Admitting: Family Medicine

## 2019-11-19 DIAGNOSIS — E7849 Other hyperlipidemia: Secondary | ICD-10-CM

## 2019-11-19 DIAGNOSIS — M7989 Other specified soft tissue disorders: Secondary | ICD-10-CM

## 2019-11-19 DIAGNOSIS — F329 Major depressive disorder, single episode, unspecified: Secondary | ICD-10-CM

## 2019-11-22 DIAGNOSIS — E1065 Type 1 diabetes mellitus with hyperglycemia: Secondary | ICD-10-CM | POA: Diagnosis not present

## 2019-12-16 DIAGNOSIS — R29898 Other symptoms and signs involving the musculoskeletal system: Secondary | ICD-10-CM | POA: Diagnosis not present

## 2019-12-22 DIAGNOSIS — E1065 Type 1 diabetes mellitus with hyperglycemia: Secondary | ICD-10-CM | POA: Diagnosis not present

## 2019-12-31 NOTE — Progress Notes (Signed)
Subjective:   Mary [name] is a 74 y.o. female who presents for Medicare Annual (Subsequent) preventive examination.  I connected with Zaela Graley today by telephone and verified that I am speaking with the correct person using two identifiers. Location patient: home Location provider: work Persons participating in the virtual visit: patient, provider.   I discussed the limitations, risks, security and privacy concerns of performing an evaluation and management service by telephone and the availability of in person appointments. I also discussed with the patient that there may be a patient responsible charge related to this service. The patient expressed understanding and verbally consented to this telephonic visit.    Interactive audio and video telecommunications were attempted between this provider and patient, however failed, due to patient having technical difficulties OR patient did not have access to video capability.  We continued and completed visit with audio only.  Review of Systems:  N/A  Cardiac Risk Factors include: advanced age (>42men, >16 women);diabetes mellitus;dyslipidemia     Objective:     Vitals: There were no vitals taken for this visit.  There is no height or weight on file to calculate BMI.  Advanced Directives 01/01/2020 12/31/2018 09/17/2018 09/05/2018 10/10/2017 10/06/2016 03/18/2016  Does Patient Have a Medical Advance Directive? No Yes No No No No No  Type of Advance Directive - Healthcare Power of [place];Living will - - - - -  Copy of Healthcare Power of Attorney in Chart? - No - copy requested - - - - -  Would patient like information on creating a medical advance directive? No - Patient declined - Yes (Inpatient - patient requests chaplain consult to create a medical advance directive) Yes (MAU/Ambulatory/Procedural Areas - Information given) Yes (MAU/Ambulatory/Procedural Areas - Information given) - -    Tobacco Social History   Tobacco Use  Smoking  Status Never Smoker  Smokeless Tobacco Never Used     Counseling given: Not Answered   Clinical Intake:  Pre-visit preparation completed: Yes  Pain : 0-10 Pain Score: 5  Pain Type: Chronic pain Pain Location: Hand(and feet) Pain Descriptors / Indicators: Burning, Numbness Pain Frequency: Constant Pain Relieving Factors: Takes Tylenol and uses a gel for pain.  Pain Relieving Factors: Takes Tylenol and uses a gel for pain.  Nutritional Risks: None Diabetes: Yes  How often do you need to have someone help you when you read instructions, pamphlets, or other written materials from your doctor or pharmacy?: 1 - Never   Diabetes:  Is the patient diabetic?  Yes  If diabetic, was a CBG obtained today?  No  Did the patient bring in their glucometer from home?  No  How often do you monitor your CBG's? Four times a day.   Financial Strains and Diabetes Management:  Are you having any financial strains with the device, your supplies or your medication? No .  Does the patient want to be seen by Chronic Care Management for management of their diabetes?  No  Would the patient like to be referred to a Nutritionist or for Diabetic Management?  No   Diabetic Exams:  Diabetic Eye Exam: Completed 05/20/19. Repeat yearly.  Diabetic Foot Exam: Completed 04/17/19. Repeat yearly.   Interpreter Needed?: No  Information entered by :: [facility], LPN  Past Medical History:  Diagnosis Date  . Arthritis    degenerative arthritis left knee  . Carpal tunnel syndrome, bilateral   . Chronic kidney disease    stage 3  . Depression   . Diabetes  mellitus without complication (HCC)   . 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 History of shingles   . Hyperlipidemia   . Hypertension   . Hypothyroidism   . Obesity    Past Surgical History:  Procedure Laterality Date  . CATARACT EXTRACTION Bilateral 2015    . CESAREAN SECTION  1980  . COLONOSCOPY WITH PROPOFOL N/A 03/18/2016   redundant colon, otherwise normal repeat 10 years. Surgeon: Christena Deem, MD;  Location: [facility] ENDOSCOPY;  . CYST EXCISION  2017   back of neck  . EYE SURGERY    . EYE SURGERY Right 01/20/2017   laser surgery for diabetic retinopathy  . TOTAL KNEE ARTHROPLASTY Left 09/17/2018   Procedure: LEFT TOTAL KNEE ARTHROPLASTY;  Surgeon: Donato Heinz, MD;  Location: [facility] ORS;  Service: Orthopedics;  Laterality: Left;   Family History  Problem Relation Age of Onset  . Lupus Mother   . Hypertension Father   . Diabetes Father   . Heart attack Father   . Diabetes Sister   . Diabetes Sister   . Hypertension Sister   . Hypertension Sister   . Breast cancer Neg Hx    Social History   Socioeconomic History  . Marital status: Divorced    Spouse name: Not on file  . Number of children: 2  . Years of education: Not on file  . Highest education level: Some college, no degree  Occupational History  . Occupation: retired  Tobacco Use  . Smoking status: Never Smoker  . Smokeless tobacco: Never Used  Substance and Sexual Activity  . Alcohol use: Yes    Alcohol/week: 0.0 - 2.0 standard drinks  . Drug use: No  . Sexual activity: Never  Other Topics Concern  . Not on file  Social History Narrative  . Not on file   Social Determinants of Health   Financial Resource Strain: Low Risk   . Difficulty of Paying Living Expenses: Not hard at all  Food Insecurity: No Food Insecurity  . Worried About Programme researcher, broadcasting/film/video in the Last Year: Never true  . Ran Out of Food in the Last Year: Never true  Transportation Needs: No Transportation Needs  . Lack of Transportation (Medical): No  . Lack of Transportation (Non-Medical): No  Physical Activity: Insufficiently Active  . Days of Exercise per Week: 3 days  . Minutes of Exercise per Session: 10 min  Stress: Stress Concern Present  . Feeling of Stress : To some extent   Social Connections: Moderately Isolated  . Frequency of Communication with Friends and Family: More than three times a week  . Frequency of Social Gatherings with Friends and Family: More than three times a week  . Attends Religious Services: Never  . Active Member of Clubs or Organizations: No  . Attends Banker Meetings: Never  . Marital Status: Divorced    Outpatient Encounter Medications as of 01/01/2020  Medication Sig  . ACCU-CHEK AVIVA PLUS test strip USE TEST STRIP(S) TO CHECK GLUCOSE 4 TIMES DAILY  . acetaminophen (TYLENOL) 650 MG CR tablet Take 1,300 mg by mouth every 8 (eight) hours as needed for pain.  Marland Kitchen atorvastatin (LIPITOR) 10 MG tablet Take 1 tablet by mouth once daily  . carvedilol (COREG) 12.5 MG tablet Take 2 tablets by mouth twice daily  . Cyanocobalamin (VITAMIN B-12) 2500 MCG SUBL Place under the tongue daily.  Marland Kitchen  diclofenac Sodium (VOLTAREN) 1 % GEL Apply 2 g topically 4 (four) times daily.   Marland Kitchen FLUoxetine (PROZAC) 20 MG capsule Take 1 capsule by mouth once daily  . furosemide (LASIX) 20 MG tablet Take 1 tablet by mouth once daily  . insulin glargine (LANTUS SOLOSTAR) 100 UNIT/ML Solostar Pen Inject 32 Units into the skin at bedtime.   . insulin lispro (HUMALOG KWIKPEN) 100 UNIT/ML KiwkPen Inject 2-10 Units into the skin 4 (four) times daily -  before meals and at bedtime.   . Insulin Pen Needle 31G X 8 MM MISC by Does not apply route.  Marland Kitchen levothyroxine (SYNTHROID) 150 MCG tablet Take 1 tablet (150 mcg total) by mouth daily before breakfast.  . quinapril (ACCUPRIL) 20 MG tablet TAKE 1 TABLET BY MOUTH ONCE DAILY  . Vitamin D, Ergocalciferol, (DRISDOL) 1.25 MG (50000 UT) CAPS capsule Take 1 capsule by mouth once a week  . enoxaparin (LOVENOX) 40 MG/0.4ML injection Inject 0.4 mLs (40 mg total) into the skin daily for 14 days. (Patient not taking: Reported on 12/31/2018)  . insulin aspart (NOVOLOG FLEXPEN) 100 UNIT/ML FlexPen Inject 4 Units into the skin 2 (two)  times daily before lunch and supper.  Marland Kitchen oxyCODONE (OXY IR/ROXICODONE) 5 MG immediate release tablet Take 1 tablet (5 mg total) by mouth every 4 (four) hours as needed for moderate pain (pain score 4-6). (Patient not taking: Reported on 12/31/2018)  . traMADol (ULTRAM) 50 MG tablet Take 1-2 tablets (50-100 mg total) by mouth every 6 (six) hours as needed for moderate pain. (Patient not taking: Reported on 12/31/2018)   No facility-administered encounter medications on file as of 01/01/2020.    Activities of Daily Living In your present state of health, do you have any difficulty performing the following activities: 01/01/2020  Hearing? N  Vision? Y  Comment Has trouble seeing out of the right eye due to diabetic retinopathy.  Difficulty concentrating or making decisions? N  Walking or climbing stairs? N  Dressing or bathing? N  Doing errands, shopping? N  Preparing Food and eating ? N  Using the Toilet? N  In the past six months, have you accidently leaked urine? Y  Comment Occasionally at night, wears protection.  Do you have problems with loss of bowel control? N  Managing your Medications? N  Managing your Finances? N  Housekeeping or managing your Housekeeping? N  Some recent data might be hidden    Patient Care Team: Maple Hudson., MD as PCP - General (Family Medicine) Lamont Dowdy, MD as Consulting Physician (Internal Medicine) Solum, Marlana Salvage, MD as Physician Assistant (Endocrinology) Ernest Pine, Illene Labrador, MD (Orthopedic Surgery) Pa, [facility] (Optometry)    Assessment:   This is a routine wellness examination for [place].  Exercise Activities and Dietary recommendations Current Exercise Habits: Home exercise routine, Type of exercise: Other - see comments(rides a stationary bike), Time (Minutes): 15, Frequency (Times/Week): 3, Weekly Exercise (Minutes/Week): 45, Intensity: Mild, Exercise limited by: orthopedic condition(s)  Goals    . Cut out extra servings      Recommend to continue current diet plan of cutting out carbohydrates and sugars in daily diet.        Fall Risk: Fall Risk  01/01/2020 12/31/2018 10/10/2017 10/06/2016 10/06/2015  Falls in the past year? 0 0 Yes No No  Number falls in past yr: 0 - 2 or more - -  Injury with Fall? 0 - No - -  Risk for fall due to : - -  Impaired balance/gait;Other (Comment) - -  Risk for fall due to: Comment - - diabetic neuropathy - -  Follow up - - Falls prevention discussed - -    FALL RISK PREVENTION PERTAINING TO THE HOME:  Any stairs in or around the home? Yes  If so, are there any without handrails? No   Home free of loose throw rugs in walkways, pet beds, electrical cords, etc? Yes  Adequate lighting in your home to reduce risk of falls? Yes   ASSISTIVE DEVICES UTILIZED TO PREVENT FALLS:  Life alert? No  Use of a cane, walker or w/c? Yes  Grab bars in the bathroom? Yes  Shower chair or bench in shower? No  Elevated toilet seat or a handicapped toilet? No   TIMED UP AND GO:  Was the test performed? No .    Depression Screen PHQ 2/9 Scores 01/01/2020 12/31/2018 12/31/2018 10/10/2017  PHQ - 2 Score 0 0 0 1  PHQ- 9 Score - 3 - -     Cognitive Function: Declined today.     6CIT Screen 10/06/2016  What Year? 0 points  What month? 0 points  What time? 0 points  Count back from 20 0 points  Months in reverse 0 points  Repeat phrase 2 points  Total Score 2    Immunization History  Administered Date(s) Administered  . Fluad Quad(high Dose 65+) 04/16/2019  . Influenza, High Dose Seasonal PF 04/08/2015, 04/22/2016, 05/26/2018  . Pneumococcal Conjugate-13 06/03/2014  . Pneumococcal Polysaccharide-23 06/06/2012  . Tdap 12/06/2012  . Zoster 12/06/2012    Qualifies for Shingles Vaccine? Yes  Zostavax completed 12/06/12. Due for Shingrix. Pt has been advised to call insurance company to determine out of pocket expense. Advised may also receive vaccine at local pharmacy or Health Dept. Verbalized  acceptance and understanding.  Tdap: Up to date  Flu Vaccine: Up to date  Pneumococcal Vaccine: Completed series  Screening Tests Health Maintenance  Topic Date Due  . COVID-19 Vaccine (1) Never done  . MAMMOGRAM  01/12/2019  . HEMOGLOBIN A1C  10/15/2019  . INFLUENZA VACCINE  02/23/2020  . FOOT EXAM  04/16/2020  . OPHTHALMOLOGY EXAM  05/19/2020  . TETANUS/TDAP  12/07/2022  . DEXA SCAN  04/02/2024  . COLONOSCOPY  03/18/2026  . Hepatitis C Screening  Completed  . PNA vac Low Risk Adult  Completed    Cancer Screenings:  Colorectal Screening: Completed 03/18/16. Repeat every 10 years.   Mammogram: Completed 01/11/17. Repeat every 1-2 years as advised. Ordered today. Pt provided with contact info and advised to call to schedule appt.   Bone Density: Completed 04/03/19. Results reflect OSTEOPENIA. Repeat every 5 years.   Lung Cancer Screening: (Low Dose CT Chest recommended if Age 46-80 years, 30 pack-year currently smoking OR have quit w/in 15years.) does not qualify.   Additional Screening:  Hepatitis C Screening: Up to date  Dental Screening: Recommended annual dental exams for proper oral hygiene   Community Resource Referral:  CRR required this visit? No     Plan:  I have personally reviewed and addressed the Medicare Annual Wellness questionnaire and have noted the following in the patient's chart:  A. Medical and social history B. Use of alcohol, tobacco or illicit drugs  C. Current medications and supplements D. Functional ability and status E.  Nutritional status F.  Physical activity G. Advance directives H. List of other physicians I.  Hospitalizations, surgeries, and ER visits in previous 12 months J.  Vitals K. Screenings such  as hearing and vision if needed, cognitive and depression L. Referrals and appointments   In addition, I have reviewed and discussed with patient certain preventive protocols, quality metrics, and best practice recommendations. A  written personalized care plan for preventive services as well as general preventive health recommendations were provided to patient.   Glendora Score, [place]  12/29/5991 Nurse Health Advisor   Nurse Notes: Pt needs a Hgb A1c check at today's in office visit.

## 2020-01-01 ENCOUNTER — Ambulatory Visit (INDEPENDENT_AMBULATORY_CARE_PROVIDER_SITE_OTHER): Payer: Medicare Other | Admitting: Family Medicine

## 2020-01-01 ENCOUNTER — Other Ambulatory Visit: Payer: Self-pay

## 2020-01-01 ENCOUNTER — Ambulatory Visit (INDEPENDENT_AMBULATORY_CARE_PROVIDER_SITE_OTHER): Payer: Medicare Other

## 2020-01-01 ENCOUNTER — Encounter: Payer: Self-pay | Admitting: Family Medicine

## 2020-01-01 VITALS — BP 164/78 | HR 72 | Temp 96.9°F | Resp 18 | Ht 65.0 in | Wt 226.0 lb

## 2020-01-01 DIAGNOSIS — E7849 Other hyperlipidemia: Secondary | ICD-10-CM

## 2020-01-01 DIAGNOSIS — M7989 Other specified soft tissue disorders: Secondary | ICD-10-CM

## 2020-01-01 DIAGNOSIS — I10 Essential (primary) hypertension: Secondary | ICD-10-CM | POA: Diagnosis not present

## 2020-01-01 DIAGNOSIS — N1831 Chronic kidney disease, stage 3a: Secondary | ICD-10-CM

## 2020-01-01 DIAGNOSIS — Z0000 Encounter for general adult medical examination without abnormal findings: Secondary | ICD-10-CM | POA: Diagnosis not present

## 2020-01-01 DIAGNOSIS — Z1231 Encounter for screening mammogram for malignant neoplasm of breast: Secondary | ICD-10-CM

## 2020-01-01 DIAGNOSIS — E1021 Type 1 diabetes mellitus with diabetic nephropathy: Secondary | ICD-10-CM

## 2020-01-01 DIAGNOSIS — E039 Hypothyroidism, unspecified: Secondary | ICD-10-CM | POA: Diagnosis not present

## 2020-01-01 DIAGNOSIS — N1832 Chronic kidney disease, stage 3b: Secondary | ICD-10-CM

## 2020-01-01 MED ORDER — ATORVASTATIN CALCIUM 10 MG PO TABS: 10.0000 mg | ORAL_TABLET | Freq: Every day | ORAL | 3 refills | Status: DC

## 2020-01-01 MED ORDER — QUINAPRIL HCL 20 MG PO TABS: 20.0000 mg | ORAL_TABLET | Freq: Every day | ORAL | 3 refills | Status: DC

## 2020-01-01 MED ORDER — FUROSEMIDE 20 MG PO TABS: 20.0000 mg | ORAL_TABLET | Freq: Every day | ORAL | 3 refills | Status: DC

## 2020-01-01 NOTE — Progress Notes (Signed)
Complete physical exam  I,Mary [name],acting as a scribe for Mary Durie, MD.,have documented all relevant documentation on the behalf of Mary Durie, MD,as directed by  Mary Durie, MD while in the presence of Mary Durie, MD.   Patient: Mary [name]   DOB: 1945-08-16   74 y.o. Female  MRN: [id number] Visit Date: 01/01/2020  Today's healthcare provider: [name], MD   Chief Complaint  Patient presents with  . Annual Exam   Subjective    Mary [name] is a 74 y.o. female who presents today for a complete physical exam.  She reports consuming a general diet. Uses her exercise bike about 3 times a week She generally feels well. She reports sleeping well. She does not have additional problems to discuss today.  Overall she feels well. HPI  Patient had AWV with [facility] today.  Past Medical History:  Diagnosis Date  . Arthritis    degenerative arthritis left knee  . Carpal tunnel syndrome, bilateral   . Chronic kidney disease    stage 3  . Depression   . Diabetes mellitus without complication ([place])   . Diabetic peripheral neuropathy associated with type 1 diabetes mellitus ([place])   . Diabetic retinal damage of both eyes ([place])   . History of shingles   . Hyperlipidemia   . Hypertension   . Hypothyroidism   . Obesity    Past Surgical History:  Procedure Laterality Date  . CATARACT EXTRACTION Bilateral 2015  . CESAREAN SECTION  1980  . COLONOSCOPY WITH PROPOFOL N/A 03/18/2016   redundant colon, otherwise normal repeat 10 years. Surgeon: Lollie Sails, MD;  Location: [facility] ENDOSCOPY;  . CYST EXCISION  2017   back of neck  . EYE SURGERY    . EYE SURGERY Right 01/20/2017   laser surgery for diabetic retinopathy  . TOTAL KNEE ARTHROPLASTY Left 09/17/2018   Procedure: LEFT TOTAL KNEE ARTHROPLASTY;  Surgeon: Dereck Leep, MD;  Location: [facility] ORS;  Service: Orthopedics;  Laterality: Left;   Social History   Socioeconomic History  .  Marital status: Divorced    Spouse name: Not on file  . Number of children: 2  . Years of education: Not on file  . Highest education level: Some college, no degree  Occupational History  . Occupation: retired  Tobacco Use  . Smoking status: Never Smoker  . Smokeless tobacco: Never Used  Substance and Sexual Activity  . Alcohol use: Yes    Alcohol/week: 0.0 - 2.0 standard drinks  . Drug use: No  . Sexual activity: Never  Other Topics Concern  . Not on file  Social History Narrative  . Not on file   Social Determinants of Health   Financial Resource Strain: Low Risk   . Difficulty of Paying Living Expenses: Not hard at all  Food Insecurity: No Food Insecurity  . Worried About Charity fundraiser in the Last Year: Never true  . Ran Out of Food in the Last Year: Never true  Transportation Needs: No Transportation Needs  . Lack of Transportation (Medical): No  . Lack of Transportation (Non-Medical): No  Physical Activity: Insufficiently Active  . Days of Exercise per Week: 3 days  . Minutes of Exercise per Session: 10 min  Stress: Stress Concern Present  . Feeling of Stress : To some extent  Social Connections: Moderately Isolated  . Frequency of Communication with Friends and Family: More than three times a week  .  Frequency of Social Gatherings with Friends and Family: More than three times a week  . Attends Religious Services: Never  . Active Member of Clubs or Organizations: No  . Attends Banker Meetings: Never  . Marital Status: Divorced  Catering manager Violence: Not At Risk  . Fear of Current or Ex-Partner: No  . Emotionally Abused: No  . Physically Abused: No  . Sexually Abused: No   Family Status  Relation Name Status  . Mother  Deceased at age 21  . Father  Deceased at age 66  . Sister  Alive  . Sister  Alive  . Sister  Alive  . Neg Hx  (Not Specified)   Family History  Problem Relation Age of Onset  . Lupus Mother   . Hypertension  Father   . Diabetes Father   . Heart attack Father   . Diabetes Sister   . Diabetes Sister   . Hypertension Sister   . Hypertension Sister   . Breast cancer Neg Hx    Allergies  Allergen Reactions  . Oxycodone Hcl Itching  . Hydrocodone Itching    Patient Care Team: Maple Hudson., MD as PCP - General (Family Medicine) Lamont Dowdy, MD as Consulting Physician (Internal Medicine) Tedd Sias Marlana Salvage, MD as Physician Assistant (Endocrinology) Ernest Pine, Illene Labrador, MD (Orthopedic Surgery) [facility], [facility])   Medications: Outpatient Medications Prior to Visit  Medication Sig  . ACCU-CHEK AVIVA PLUS test strip USE TEST STRIP(S) TO CHECK GLUCOSE 4 TIMES DAILY  . acetaminophen (TYLENOL) 650 MG CR tablet Take 1,300 mg by mouth every 8 (eight) hours as needed for pain.  Marland Kitchen atorvastatin (LIPITOR) 10 MG tablet Take 1 tablet by mouth once daily  . carvedilol (COREG) 12.5 MG tablet Take 2 tablets by mouth twice daily  . Cyanocobalamin (VITAMIN B-12) 2500 MCG SUBL Place under the tongue daily.  . diclofenac Sodium (VOLTAREN) 1 % GEL Apply 2 g topically 4 (four) times daily.   Marland Kitchen FLUoxetine (PROZAC) 20 MG capsule Take 1 capsule by mouth once daily  . furosemide (LASIX) 20 MG tablet Take 1 tablet by mouth once daily  . insulin aspart (NOVOLOG FLEXPEN) 100 UNIT/ML FlexPen Inject 4 Units into the skin 2 (two) times daily before lunch and supper.  . insulin glargine (LANTUS SOLOSTAR) 100 UNIT/ML Solostar Pen Inject 32 Units into the skin at bedtime.   . insulin lispro (HUMALOG KWIKPEN) 100 UNIT/ML KiwkPen Inject 2-10 Units into the skin 4 (four) times daily -  before meals and at bedtime.   . Insulin Pen Needle 31G X 8 MM MISC by Does not apply route.  Marland Kitchen levothyroxine (SYNTHROID) 150 MCG tablet Take 1 tablet (150 mcg total) by mouth daily before breakfast.  . oxyCODONE (OXY IR/ROXICODONE) 5 MG immediate release tablet Take 1 tablet (5 mg total) by mouth every 4 (four) hours as needed  for moderate pain (pain score 4-6).  Marland Kitchen traMADol (ULTRAM) 50 MG tablet Take 1-2 tablets (50-100 mg total) by mouth every 6 (six) hours as needed for moderate pain.  . Vitamin D, Ergocalciferol, (DRISDOL) 1.25 MG (50000 UT) CAPS capsule Take 1 capsule by mouth once a week  . [DISCONTINUED] quinapril (ACCUPRIL) 20 MG tablet TAKE 1 TABLET BY MOUTH ONCE DAILY  . enoxaparin (LOVENOX) 40 MG/0.4ML injection Inject 0.4 mLs (40 mg total) into the skin daily for 14 days. (Patient not taking: Reported on 12/31/2018)   No facility-administered medications prior to visit.    Review  of Systems  Constitutional: Negative.   HENT: Negative.   Eyes: Negative.   Respiratory: Negative.   Cardiovascular: Negative.   Gastrointestinal: Negative.   Endocrine: Positive for polyuria.  Genitourinary: Negative.   Musculoskeletal: Negative.   Neurological: Positive for numbness.  Hematological: Negative.   Psychiatric/Behavioral: Negative.       Objective    BP (!) 164/78 (BP Location: Left Arm, Cuff Size: Large)   Pulse 72   Temp (!) 96.9 F (36.1 C) (Other (Comment))   Resp 18   Ht 5\' 5"  (1.651 m)   Wt 226 lb (102.5 kg)   SpO2 98%   BMI 37.61 kg/m    Physical Exam Vitals and nursing note reviewed. Exam conducted with a chaperone present.  Constitutional:      Appearance: Normal appearance.  HENT:     Head:     Jaw: There is normal jaw occlusion.     Right Ear: Tympanic membrane normal.     Left Ear: Tympanic membrane normal.     Mouth/Throat:     Mouth: Mucous membranes are moist.     Pharynx: Oropharynx is clear.  Eyes:     Conjunctiva/sclera: Conjunctivae normal.  Cardiovascular:     Rate and Rhythm: Normal rate and regular rhythm.     Pulses: Normal pulses.     Heart sounds: Normal heart sounds.  Pulmonary:     Effort: Pulmonary effort is normal.     Breath sounds: Normal breath sounds.  Chest:     Breasts:        Right: Normal.        Left: Normal.  Abdominal:     General:  Abdomen is flat. Bowel sounds are normal.     Palpations: Abdomen is soft.  Genitourinary:    Comments: Pelvic and rectal deferred to next year. Musculoskeletal:        General: Normal range of motion.     Cervical back: Normal range of motion and neck supple.  Skin:    General: Skin is warm and dry.  Neurological:     General: No focal deficit present.     Mental Status: She is alert.  Psychiatric:        Attention and Perception: Attention and perception normal.        Mood and Affect: Mood normal.        Speech: Speech normal.        Behavior: Behavior normal. Behavior is cooperative.        Thought Content: Thought content normal.        Cognition and Memory: Cognition and memory normal.        Judgment: Judgment normal.       Depression Screen  PHQ 2/9 Scores 01/01/2020 12/31/2018 12/31/2018  PHQ - 2 Score 0 0 0  PHQ- 9 Score - 3 -    No results found for any visits on 01/01/20.  Assessment & Plan    Routine Health Maintenance and Physical Exam  Exercise Activities and Dietary recommendations Goals    . Cut out extra servings     Recommend to continue current diet plan of cutting out carbohydrates and sugars in daily diet.        Immunization History  Administered Date(s) Administered  . Fluad Quad(high Dose 65+) 04/16/2019  . Influenza, High Dose Seasonal PF 04/08/2015, 04/22/2016, 05/26/2018  . Pneumococcal Conjugate-13 06/03/2014  . Pneumococcal Polysaccharide-23 06/06/2012  . Tdap 12/06/2012  . Zoster 12/06/2012    Health Maintenance  Topic Date Due  . COVID-19 Vaccine (1) Never done  . MAMMOGRAM  01/12/2019  . HEMOGLOBIN A1C  10/15/2019  . INFLUENZA VACCINE  02/23/2020  . OPHTHALMOLOGY EXAM  05/19/2020  . FOOT EXAM  12/31/2020  . TETANUS/TDAP  12/07/2022  . DEXA SCAN  04/02/2024  . COLONOSCOPY  03/18/2026  . Hepatitis C Screening  Completed  . PNA vac Low Risk Adult  Completed    Discussed health benefits of physical activity, and encouraged her  to engage in regular exercise appropriate for her age and condition.  1. Annual physical exam  - TSH - Lipid panel - CBC w/Diff/Platelet - Comprehensive Metabolic Panel (CMET)  2. Essential hypertension Get blood pressure cuff and bring in readings on next visit. - TSH - Lipid panel - CBC w/Diff/Platelet - Comprehensive Metabolic Panel (CMET) - quinapril (ACCUPRIL) 20 MG tablet; Take 1 tablet (20 mg total) by mouth daily.  Dispense: 90 tablet; Refill: 3  3. Other hyperlipidemia  - TSH - Lipid panel - CBC w/Diff/Platelet - Comprehensive Metabolic Panel (CMET)  4. Hypothyroidism, unspecified type  - TSH - Lipid panel - CBC w/Diff/Platelet - Comprehensive Metabolic Panel (CMET)  5. Chronic kidney disease (CKD) stage G3a/A1, moderately decreased glomerular filtration rate (GFR) between 45-59 mL/min/1.73 square meter and albuminuria creatinine ratio less than 30 mg/g  - TSH - Lipid panel - CBC w/Diff/Platelet - Comprehensive Metabolic Panel (CMET)  6. 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     I, [name], MD, have reviewed all documentation for this visit. The documentation on 01/06/20 for the exam, diagnosis, procedures, and orders are all accurate and complete.    Stevie Charter Wendelyn Breslow, MD  [facility] [phone] (phone) [phone] (fax)  [facility]

## 2020-01-01 NOTE — Patient Instructions (Signed)
Get home blood pressure cuff. Derenda Mis or [employer].

## 2020-01-01 NOTE — Patient Instructions (Signed)
Mary [name] , Thank you for taking time to come for your Medicare Wellness Visit. I appreciate your ongoing commitment to your health goals. Please review the following plan we discussed and let me know if I can assist you in the future.   Screening recommendations/referrals: Colonoscopy: Up to date, due 02/2026 Mammogram: Ordered today. Pt provided with contact info and advised to call to schedule appt.  Bone Density: Up to date, due 03/2024 Recommended yearly ophthalmology/optometry visit for glaucoma screening and checkup Recommended yearly dental visit for hygiene and checkup  Vaccinations: Influenza vaccine: Up to date Pneumococcal vaccine: Completed series Tdap vaccine: Up to date, due 11/2022 Shingles vaccine: Pt declines today.     Advanced directives: Advance directive discussed with you today. Even though you declined this today please call our office should you change your mind and we can give you the proper paperwork for you to fill out.  Conditions/risks identified: Recommend to continue to cut back on carbohydrates and sugar foods in diet to help aid with weight loss.   Next appointment: 2:40 PM today with Dr Sullivan Lone    Preventive Care 72 Years and Older, Female Preventive care refers to lifestyle choices and visits with your health care provider that can promote health and wellness. What does preventive care include?  A yearly physical exam. This is also called an annual well check.  Dental exams once or twice a year.  Routine eye exams. Ask your health care provider how often you should have your eyes checked.  Personal lifestyle choices, including:  Daily care of your teeth and gums.  Regular physical activity.  Eating a healthy diet.  Avoiding tobacco and drug use.  Limiting alcohol use.  Practicing safe sex.  Taking low-dose aspirin every day.  Taking vitamin and mineral supplements as recommended by your health care provider. What happens during an  annual well check? The services and screenings done by your health care provider during your annual well check will depend on your age, overall health, lifestyle risk factors, and family history of disease. Counseling  Your health care provider may ask you questions about your:  Alcohol use.  Tobacco use.  Drug use.  Emotional well-being.  Home and relationship well-being.  Sexual activity.  Eating habits.  History of falls.  Memory and ability to understand (cognition).  Work and work Astronomer.  Reproductive health. Screening  You may have the following tests or measurements:  Height, weight, and BMI.  Blood pressure.  Lipid and cholesterol levels. These may be checked every 5 years, or more frequently if you are over 31 years old.  Skin check.  Lung cancer screening. You may have this screening every year starting at age 50 if you have a 30-pack-year history of smoking and currently smoke or have quit within the past 15 years.  Fecal occult blood test (FOBT) of the stool. You may have this test every year starting at age 32.  Flexible sigmoidoscopy or colonoscopy. You may have a sigmoidoscopy every 5 years or a colonoscopy every 10 years starting at age 44.  Hepatitis C blood test.  Hepatitis B blood test.  Sexually transmitted disease (STD) testing.  Diabetes screening. This is done by checking your blood sugar (glucose) after you have not eaten for a while (fasting). You may have this done every 1-3 years.  Bone density scan. This is done to screen for osteoporosis. You may have this done starting at age 61.  Mammogram. This may be done every 1-2  years. Talk to your health care provider about how often you should have regular mammograms. Talk with your health care provider about your test results, treatment options, and if necessary, the need for more tests. Vaccines  Your health care provider may recommend certain vaccines, such as:  Influenza  vaccine. This is recommended every year.  Tetanus, diphtheria, and acellular pertussis (Tdap, Td) vaccine. You may need a Td booster every 10 years.  Zoster vaccine. You may need this after age 77.  Pneumococcal 13-valent conjugate (PCV13) vaccine. One dose is recommended after age 10.  Pneumococcal polysaccharide (PPSV23) vaccine. One dose is recommended after age 39. Talk to your health care provider about which screenings and vaccines you need and how often you need them. This information is not intended to replace advice given to you by your health care provider. Make sure you discuss any questions you have with your health care provider. Document Released: 08/07/2015 Document Revised: 03/30/2016 Document Reviewed: 05/12/2015 Elsevier Interactive Patient Education  2017 [place] Prevention in the Home Falls can cause injuries. They can happen to people of all ages. There are many things you can do to make your home safe and to help prevent falls. What can I do on the outside of my home?  Regularly fix the edges of walkways and driveways and fix any cracks.  Remove anything that might make you trip as you walk through a door, such as a raised step or threshold.  Trim any bushes or trees on the path to your home.  Use bright outdoor lighting.  Clear any walking paths of anything that might make someone trip, such as rocks or tools.  Regularly check to see if handrails are loose or broken. Make sure that both sides of any steps have handrails.  Any raised decks and porches should have guardrails on the edges.  Have any leaves, snow, or ice cleared regularly.  Use sand or salt on walking paths during winter.  Clean up any spills in your garage right away. This includes oil or grease spills. What can I do in the bathroom?  Use night lights.  Install grab bars by the toilet and in the tub and shower. Do not use towel bars as grab bars.  Use non-skid mats or decals  in the tub or shower.  If you need to sit down in the shower, use a plastic, non-slip stool.  Keep the floor dry. Clean up any water that spills on the floor as soon as it happens.  Remove soap buildup in the tub or shower regularly.  Attach bath mats securely with double-sided non-slip rug tape.  Do not have throw rugs and other things on the floor that can make you trip. What can I do in the bedroom?  Use night lights.  Make sure that you have a light by your bed that is easy to reach.  Do not use any sheets or blankets that are too big for your bed. They should not hang down onto the floor.  Have a firm chair that has side arms. You can use this for support while you get dressed.  Do not have throw rugs and other things on the floor that can make you trip. What can I do in the kitchen?  Clean up any spills right away.  Avoid walking on wet floors.  Keep items that you use a lot in easy-to-reach places.  If you need to reach something above you, use a strong step  stool that has a grab bar.  Keep electrical cords out of the way.  Do not use floor polish or wax that makes floors slippery. If you must use wax, use non-skid floor wax.  Do not have throw rugs and other things on the floor that can make you trip. What can I do with my stairs?  Do not leave any items on the stairs.  Make sure that there are handrails on both sides of the stairs and use them. Fix handrails that are broken or loose. Make sure that handrails are as long as the stairways.  Check any carpeting to make sure that it is firmly attached to the stairs. Fix any carpet that is loose or worn.  Avoid having throw rugs at the top or bottom of the stairs. If you do have throw rugs, attach them to the floor with carpet tape.  Make sure that you have a light switch at the top of the stairs and the bottom of the stairs. If you do not have them, ask someone to add them for you. What else can I do to help  prevent falls?  Wear shoes that:  Do not have high heels.  Have rubber bottoms.  Are comfortable and fit you well.  Are closed at the toe. Do not wear sandals.  If you use a stepladder:  Make sure that it is fully opened. Do not climb a closed stepladder.  Make sure that both sides of the stepladder are locked into place.  Ask someone to hold it for you, if possible.  Clearly mark and make sure that you can see:  Any grab bars or handrails.  First and last steps.  Where the edge of each step is.  Use tools that help you move around (mobility aids) if they are needed. These include:  Canes.  Walkers.  Scooters.  Crutches.  Turn on the lights when you go into a dark area. Replace any light bulbs as soon as they burn out.  Set up your furniture so you have a clear path. Avoid moving your furniture around.  If any of your floors are uneven, fix them.  If there are any pets around you, be aware of where they are.  Review your medicines with your doctor. Some medicines can make you feel dizzy. This can increase your chance of falling. Ask your doctor what other things that you can do to help prevent falls. This information is not intended to replace advice given to you by your health care provider. Make sure you discuss any questions you have with your health care provider. Document Released: 05/07/2009 Document Revised: 12/17/2015 Document Reviewed: 08/15/2014 Elsevier Interactive Patient Education  2017 [employer].

## 2020-01-07 DIAGNOSIS — E039 Hypothyroidism, unspecified: Secondary | ICD-10-CM | POA: Diagnosis not present

## 2020-01-07 DIAGNOSIS — E7849 Other hyperlipidemia: Secondary | ICD-10-CM | POA: Diagnosis not present

## 2020-01-07 DIAGNOSIS — Z0000 Encounter for general adult medical examination without abnormal findings: Secondary | ICD-10-CM | POA: Diagnosis not present

## 2020-01-07 DIAGNOSIS — I10 Essential (primary) hypertension: Secondary | ICD-10-CM | POA: Diagnosis not present

## 2020-01-08 ENCOUNTER — Telehealth: Payer: Self-pay

## 2020-01-08 DIAGNOSIS — E039 Hypothyroidism, unspecified: Secondary | ICD-10-CM

## 2020-01-08 LAB — CBC WITH DIFFERENTIAL/PLATELET
Basophils Absolute: 0.1 10*3/uL (ref 0.0–0.2)
Basos: 1 %
EOS (ABSOLUTE): 0.2 10*3/uL (ref 0.0–0.4)
Eos: 3 %
Hematocrit: 39.1 % (ref 34.0–46.6)
Hemoglobin: 12.8 g/dL (ref 11.1–15.9)
Immature Grans (Abs): 0 10*3/uL (ref 0.0–0.1)
Immature Granulocytes: 0 %
Lymphocytes Absolute: 2.3 10*3/uL (ref 0.7–3.1)
Lymphs: 33 %
MCH: 30.6 pg (ref 26.6–33.0)
MCHC: 32.7 g/dL (ref 31.5–35.7)
MCV: 94 fL (ref 79–97)
Monocytes Absolute: 0.8 10*3/uL (ref 0.1–0.9)
Monocytes: 11 %
Neutrophils Absolute: 3.7 10*3/uL (ref 1.4–7.0)
Neutrophils: 52 %
Platelets: 207 10*3/uL (ref 150–450)
RBC: 4.18 x10E6/uL (ref 3.77–5.28)
RDW: 12.3 % (ref 11.7–15.4)
WBC: 7.1 10*3/uL (ref 3.4–10.8)

## 2020-01-08 LAB — COMPREHENSIVE METABOLIC PANEL
ALT: 8 IU/L (ref 0–32)
AST: 15 IU/L (ref 0–40)
Albumin/Globulin Ratio: 1.1 — ABNORMAL LOW (ref 1.2–2.2)
Albumin: 3.7 g/dL (ref 3.7–4.7)
Alkaline Phosphatase: 95 IU/L (ref 48–121)
BUN/Creatinine Ratio: 22 (ref 12–28)
BUN: 27 mg/dL (ref 8–27)
Bilirubin Total: 0.4 mg/dL (ref 0.0–1.2)
CO2: 24 mmol/L (ref 20–29)
Calcium: 9.9 mg/dL (ref 8.7–10.3)
Chloride: 102 mmol/L (ref 96–106)
Creatinine, Ser: 1.25 mg/dL — ABNORMAL HIGH (ref 0.57–1.00)
GFR calc Af Amer: 49 mL/min/{1.73_m2} — ABNORMAL LOW (ref >59)
GFR calc non Af Amer: 43 mL/min/{1.73_m2} — ABNORMAL LOW (ref >59)
Globulin, Total: 3.4 g/dL (ref 1.5–4.5)
Glucose: 116 mg/dL — ABNORMAL HIGH (ref 65–99)
Potassium: 4.1 mmol/L (ref 3.5–5.2)
Sodium: 141 mmol/L (ref 134–144)
Total Protein: 7.1 g/dL (ref 6.0–8.5)

## 2020-01-08 LAB — LIPID PANEL
Chol/HDL Ratio: 2.6 ratio (ref 0.0–4.4)
Cholesterol, Total: 205 mg/dL — ABNORMAL HIGH (ref 100–199)
HDL: 78 mg/dL (ref >39)
LDL Chol Calc (NIH): 114 mg/dL — ABNORMAL HIGH (ref 0–99)
Triglycerides: 71 mg/dL (ref 0–149)
VLDL Cholesterol Cal: 13 mg/dL (ref 5–40)

## 2020-01-08 LAB — TSH: TSH: 8.67 u[IU]/mL — ABNORMAL HIGH (ref 0.450–4.500)

## 2020-01-08 NOTE — Telephone Encounter (Signed)
Called to advise patient as below, LVMTCB. 

## 2020-01-08 NOTE — Telephone Encounter (Signed)
-----   Message from Maple Hudson., MD sent at 01/08/2020 12:28 PM EDT ----- Increase levothyroxine from 150 to 175 mcg daily.  Follow-up 3 -19months

## 2020-01-09 MED ORDER — LEVOTHYROXINE SODIUM 175 MCG PO TABS: 175.0000 ug | ORAL_TABLET | Freq: Every day | ORAL | 2 refills | Status: DC

## 2020-01-09 NOTE — Telephone Encounter (Signed)
Pt returned call to Atiya.

## 2020-01-09 NOTE — Telephone Encounter (Signed)
Called to advise patient and also sent in medication refill.

## 2020-01-20 DIAGNOSIS — E103593 Type 1 diabetes mellitus with proliferative diabetic retinopathy without macular edema, bilateral: Secondary | ICD-10-CM | POA: Diagnosis not present

## 2020-01-21 DIAGNOSIS — E1065 Type 1 diabetes mellitus with hyperglycemia: Secondary | ICD-10-CM | POA: Diagnosis not present

## 2020-02-06 DIAGNOSIS — N1832 Chronic kidney disease, stage 3b: Secondary | ICD-10-CM | POA: Diagnosis not present

## 2020-02-12 DIAGNOSIS — E1122 Type 2 diabetes mellitus with diabetic chronic kidney disease: Secondary | ICD-10-CM | POA: Diagnosis not present

## 2020-02-12 DIAGNOSIS — R809 Proteinuria, unspecified: Secondary | ICD-10-CM | POA: Diagnosis not present

## 2020-02-12 DIAGNOSIS — N1832 Chronic kidney disease, stage 3b: Secondary | ICD-10-CM | POA: Diagnosis not present

## 2020-02-12 DIAGNOSIS — N2581 Secondary hyperparathyroidism of renal origin: Secondary | ICD-10-CM | POA: Diagnosis not present

## 2020-02-19 DIAGNOSIS — N183 Chronic kidney disease, stage 3 unspecified: Secondary | ICD-10-CM | POA: Diagnosis not present

## 2020-02-19 DIAGNOSIS — E1022 Type 1 diabetes mellitus with diabetic chronic kidney disease: Secondary | ICD-10-CM | POA: Diagnosis not present

## 2020-02-19 DIAGNOSIS — E1042 Type 1 diabetes mellitus with diabetic polyneuropathy: Secondary | ICD-10-CM | POA: Diagnosis not present

## 2020-02-19 DIAGNOSIS — E103313 Type 1 diabetes mellitus with moderate nonproliferative diabetic retinopathy with macular edema, bilateral: Secondary | ICD-10-CM | POA: Diagnosis not present

## 2020-02-20 DIAGNOSIS — E1065 Type 1 diabetes mellitus with hyperglycemia: Secondary | ICD-10-CM | POA: Diagnosis not present

## 2020-02-26 ENCOUNTER — Other Ambulatory Visit: Payer: Self-pay | Admitting: Family Medicine

## 2020-02-26 DIAGNOSIS — F329 Major depressive disorder, single episode, unspecified: Secondary | ICD-10-CM

## 2020-02-27 ENCOUNTER — Other Ambulatory Visit: Payer: Self-pay | Admitting: Family Medicine

## 2020-02-27 NOTE — Telephone Encounter (Signed)
Requested medication (s) are due for refill today: yes  Requested medication (s) are on the active medication list: yes  Last refill: 11/20/2019  Future visit scheduled: yes  Notes to clinic:  this refill cannot be delegated    Requested Prescriptions  Pending Prescriptions Disp Refills   Vitamin D, Ergocalciferol, (DRISDOL) 1.25 MG (50000 UNIT) CAPS capsule [Pharmacy Med Name: VITAMIN D2 (ERGO) 1.25MG   CAP] 12 capsule 0    Sig: Take 1 capsule by mouth once a week      Endocrinology:  Vitamins - Vitamin D Supplementation Failed - 02/27/2020  5:30 AM      Failed - 50,000 IU strengths are not delegated      Failed - Phosphate in normal range and within 360 days    No results found for: PHOS        Failed - Vitamin D in normal range and within 360 days    Vit D, 25-Hydroxy  Date Value Ref Range Status  12/05/2016 19.0 (L) 30.0 - 100.0 ng/mL Final    Comment:    Vitamin D deficiency has been defined by the Institute of Medicine and an Endocrine Society practice guideline as a level of serum 25-OH vitamin D less than 20 ng/mL (1,2). The Endocrine Society went on to further define vitamin D insufficiency as a level between 21 and 29 ng/mL (2). 1. IOM (Institute of Medicine). 2010. Dietary reference    intakes for calcium and D. [place]: The    [employer]. 2. Holick MF, Binkley , Bischoff-Ferrari HA, et al.    Evaluation, treatment, and prevention of vitamin D    deficiency: an Endocrine Society clinical practice    guideline. JCEM. 2011 Jul; 96(7):1911-30.           Passed - Ca in normal range and within 360 days    Calcium  Date Value Ref Range Status  01/07/2020 9.9 8.7 - 10.3 mg/dL Final   Calcium, Total  Date Value Ref Range Status  07/11/2012 9.8 8.5 - 10.1 mg/dL Final          Passed - Valid encounter within last 12 months    Recent Outpatient Visits           1 month ago Annual physical exam   [facility] Maple Hudson., MD   10 months ago Essential hypertension   [facility] Maple Hudson., MD   1 year ago Essential hypertension   [facility] Maple Hudson., MD   1 year ago Swelling of both lower extremities   [facility] Maple Hudson., MD   2 years ago Swelling of both lower extremities   [facility] Maple Hudson., MD       Future Appointments             In 1 month Maple Hudson., MD [facility]

## 2020-03-21 DIAGNOSIS — E1065 Type 1 diabetes mellitus with hyperglycemia: Secondary | ICD-10-CM | POA: Diagnosis not present

## 2020-04-01 NOTE — Progress Notes (Signed)
I,Mary [name],acting as a scribe for Mary Mans, MD.,have documented all relevant documentation on the behalf of Mary Mans, MD,as directed by  Mary Mans, MD while in the presence of Mary Mans, MD.   Established patient visit   Patient: Mary [name]   DOB: 22-Feb-1946   74 y.o. Female  MRN: [id number] Visit Date: 04/02/2020  Today's healthcare provider: [name], MD   Chief Complaint  Patient presents with  . Follow-up  . Hypertension   Subjective    HPI  Overall pt feels well. Hypertension, follow-up  BP Readings from Last 3 Encounters:  04/02/20 125/69  01/01/20 (!) 164/78  04/29/19 128/62   Wt Readings from Last 3 Encounters:  04/02/20 215 lb (97.5 kg)  01/01/20 226 lb (102.5 kg)  04/29/19 220 lb (99.8 kg)     She was last seen for hypertension 3 months ago.  BP at that visit was 164/78. Management since that visit includes;Get blood pressure cuff and bring in readings on next visit. She reports good compliance with treatment. She is not having side effects. none She is not exercising. She is adherent to low salt diet.   Outside blood pressures are not checking.  She does not smoke.  Use of agents associated with hypertension: none.   --------------------------------------------------------------------      Medications: Outpatient Medications Prior to Visit  Medication Sig  . acetaminophen (TYLENOL) 650 MG CR tablet Take 1,300 mg by mouth every 8 (eight) hours as needed for pain.  Marland Kitchen atorvastatin (LIPITOR) 10 MG tablet Take 1 tablet (10 mg total) by mouth daily.  . carvedilol (COREG) 12.5 MG tablet Take 2 tablets by mouth twice daily  . Cyanocobalamin (VITAMIN B-12) 2500 MCG SUBL Place under the tongue daily.  . diclofenac Sodium (VOLTAREN) 1 % GEL Apply 2 g topically 4 (four) times daily.   Marland Kitchen FLUoxetine (PROZAC) 20 MG capsule Take 1 capsule by mouth once daily  . furosemide (LASIX) 20 MG tablet Take 1  tablet (20 mg total) by mouth daily.  . insulin glargine (LANTUS SOLOSTAR) 100 UNIT/ML Solostar Pen Inject 32 Units into the skin at bedtime.   . insulin lispro (HUMALOG KWIKPEN) 100 UNIT/ML KiwkPen Inject 2-10 Units into the skin 4 (four) times daily -  before meals and at bedtime.   . Insulin Pen Needle 31G X 8 MM MISC by Does not apply route.  Marland Kitchen levothyroxine (SYNTHROID) 150 MCG tablet Take by mouth.  . quinapril (ACCUPRIL) 20 MG tablet Take 1 tablet (20 mg total) by mouth daily.  . Vitamin D, Ergocalciferol, (DRISDOL) 1.25 MG (50000 UNIT) CAPS capsule Take 1 capsule by mouth once a week  . ACCU-CHEK AVIVA PLUS test strip USE TEST STRIP(S) TO CHECK GLUCOSE 4 TIMES DAILY (Patient not taking: Reported on 04/02/2020)  . enoxaparin (LOVENOX) 40 MG/0.4ML injection Inject 0.4 mLs (40 mg total) into the skin daily for 14 days. (Patient not taking: Reported on 12/31/2018)  . insulin aspart (NOVOLOG FLEXPEN) 100 UNIT/ML FlexPen Inject 4 Units into the skin 2 (two) times daily before lunch and supper. (Patient not taking: Reported on 04/02/2020)  . levothyroxine (SYNTHROID) 175 MCG tablet Take 1 tablet (175 mcg total) by mouth daily before breakfast. (Patient not taking: Reported on 04/02/2020)  . oxyCODONE (OXY IR/ROXICODONE) 5 MG immediate release tablet Take 1 tablet (5 mg total) by mouth every 4 (four) hours as needed for moderate pain (pain score 4-6). (Patient not taking: Reported on 04/02/2020)  .  traMADol (ULTRAM) 50 MG tablet Take 1-2 tablets (50-100 mg total) by mouth every 6 (six) hours as needed for moderate pain. (Patient not taking: Reported on 04/02/2020)   No facility-administered medications prior to visit.    Review of Systems  Constitutional: Negative for appetite change, chills, fatigue and fever.  Respiratory: Negative for chest tightness and shortness of breath.   Cardiovascular: Negative for chest pain and palpitations.  Gastrointestinal: Negative for abdominal pain, nausea and vomiting.    Neurological: Negative for dizziness and weakness.      Objective    BP 125/69 (BP Location: Right Arm, Patient Position: Sitting, Cuff Size: Large)   Pulse 61   Temp 98.2 F (36.8 C) (Oral)   Resp 16   Ht 5\' 5"  (1.651 m)   Wt 215 lb (97.5 kg)   SpO2 97%   BMI 35.78 kg/m    Physical Exam Vitals reviewed.  Constitutional:      Appearance: She is well-developed. She is obese.  HENT:     Head: Normocephalic and atraumatic.  Eyes:     General: No scleral icterus.    Conjunctiva/sclera: Conjunctivae normal.  Cardiovascular:     Rate and Rhythm: Normal rate and regular rhythm.     Heart sounds: Normal heart sounds.  Pulmonary:     Effort: Pulmonary effort is normal.     Breath sounds: Normal breath sounds.  Abdominal:     Palpations: Abdomen is soft.  Musculoskeletal:     Comments: Trace pedal edema--left greater than right.  Skin:    General: Skin is warm and dry.  Neurological:     General: No focal deficit present.     Mental Status: She is alert and oriented to person, place, and time.  Psychiatric:        Mood and Affect: Mood normal.        Behavior: Behavior normal.        Thought Content: Thought content normal.        Judgment: Judgment normal.     BP 125/69 (BP Location: Right Arm, Patient Position: Sitting, Cuff Size: Large)   Pulse 61   Temp 98.2 F (36.8 C) (Oral)   Resp 16   Ht 5\' 5"  (1.651 m)   Wt 215 lb (97.5 kg)   SpO2 97%   BMI 35.78 kg/m   General Appearance:    Alert, cooperative, no distress, appears stated age  Head:    Normocephalic, without obvious abnormality, atraumatic  Eyes:    PERRL, conjunctiva/corneas clear, EOM's intact, fundi    benign, both eyes  Ears:    Normal TM's and external ear canals, both ears  Nose:   Nares normal, septum midline, mucosa normal, no drainage    or sinus tenderness  Throat:   Lips, mucosa, and tongue normal; teeth and gums normal  Neck:   Supple, symmetrical, trachea midline, no adenopathy;     thyroid:  no enlargement/tenderness/nodules; no carotid   bruit or JVD  Back:     Symmetric, no curvature, ROM normal, no CVA tenderness  Lungs:     Clear to auscultation bilaterally, respirations unlabored  Chest Wall:    No tenderness or deformity   Heart:    Regular rate and rhythm, S1 and S2 normal, no murmur, rub   or gallop  Breast Exam:    No tenderness, masses, or nipple abnormality  Abdomen:     Soft, non-tender, bowel sounds active all four quadrants,    no masses, no  organomegaly  Genitalia:    Normal female without lesion, discharge or tenderness  Rectal:    Normal tone, normal prostate, no masses or tenderness;   guaiac negative stool  Extremities:   Extremities normal, atraumatic, no cyanosis or edema  Pulses:   2+ and symmetric all extremities  Skin:   Skin color, texture, turgor normal, no rashes or lesions  Lymph nodes:   Cervical, supraclavicular, and axillary nodes normal  Neurologic:   CNII-XII intact, normal strength, sensation and reflexes    throughout     No results found for any visits on 04/02/20.  Assessment & Plan     1. Essential hypertension Good control.  2. Need for immunization against influenza  - Flu Vaccine QUAD High Dose(Fluad)  3. Hypothyroidism, unspecified type   4. Uncontrolled type 1 diabetes mellitus with hyperglycemia [facility]) prt Endocrinology.  5. Class 2 severe obesity due to excess calories with serious comorbidity and body mass index (BMI) of 36.0 to 36.9 in adult [facility]) Diet and exercise stressed.    Return in about 6 months (around 09/30/2020).      I, Mary Mans, MD, have reviewed all documentation for this visit. The documentation on 04/11/20 for the exam, diagnosis, procedures, and orders are all accurate and complete.    Tujuana Kilmartin Wendelyn Breslow, MD  [facility] [phone] (phone) [phone] (fax)  [facility]

## 2020-04-02 ENCOUNTER — Other Ambulatory Visit: Payer: Self-pay | Admitting: Family Medicine

## 2020-04-02 ENCOUNTER — Encounter: Payer: Self-pay | Admitting: Family Medicine

## 2020-04-02 ENCOUNTER — Ambulatory Visit (INDEPENDENT_AMBULATORY_CARE_PROVIDER_SITE_OTHER): Payer: Medicare Other | Admitting: Family Medicine

## 2020-04-02 ENCOUNTER — Other Ambulatory Visit: Payer: Self-pay

## 2020-04-02 VITALS — BP 125/69 | HR 61 | Temp 98.2°F | Resp 16 | Ht 65.0 in | Wt 215.0 lb

## 2020-04-02 DIAGNOSIS — Z23 Encounter for immunization: Secondary | ICD-10-CM | POA: Diagnosis not present

## 2020-04-02 DIAGNOSIS — I10 Essential (primary) hypertension: Secondary | ICD-10-CM | POA: Diagnosis not present

## 2020-04-02 DIAGNOSIS — F329 Major depressive disorder, single episode, unspecified: Secondary | ICD-10-CM

## 2020-04-02 DIAGNOSIS — E039 Hypothyroidism, unspecified: Secondary | ICD-10-CM | POA: Diagnosis not present

## 2020-04-02 DIAGNOSIS — E1065 Type 1 diabetes mellitus with hyperglycemia: Secondary | ICD-10-CM | POA: Diagnosis not present

## 2020-04-02 DIAGNOSIS — Z6836 Body mass index (BMI) 36.0-36.9, adult: Secondary | ICD-10-CM

## 2020-04-20 DIAGNOSIS — E1065 Type 1 diabetes mellitus with hyperglycemia: Secondary | ICD-10-CM | POA: Diagnosis not present

## 2020-05-01 DIAGNOSIS — M1711 Unilateral primary osteoarthritis, right knee: Secondary | ICD-10-CM | POA: Diagnosis not present

## 2020-05-01 DIAGNOSIS — M25561 Pain in right knee: Secondary | ICD-10-CM | POA: Diagnosis not present

## 2020-05-01 DIAGNOSIS — M7051 Other bursitis of knee, right knee: Secondary | ICD-10-CM | POA: Diagnosis not present

## 2020-05-01 DIAGNOSIS — G8929 Other chronic pain: Secondary | ICD-10-CM | POA: Diagnosis not present

## 2020-05-19 DIAGNOSIS — E103593 Type 1 diabetes mellitus with proliferative diabetic retinopathy without macular edema, bilateral: Secondary | ICD-10-CM | POA: Diagnosis not present

## 2020-05-20 DIAGNOSIS — E1065 Type 1 diabetes mellitus with hyperglycemia: Secondary | ICD-10-CM | POA: Diagnosis not present

## 2020-05-21 ENCOUNTER — Other Ambulatory Visit: Payer: Self-pay | Admitting: Family Medicine

## 2020-05-21 NOTE — Telephone Encounter (Signed)
Requested medication (s) are due for refill today:yes  Requested medication (s) are on the active medication list: yes  Last refill:  02/28/2020  Future visit scheduled: yes  Notes to clinic:this refill cannot be delegated    Requested Prescriptions  Pending Prescriptions Disp Refills   Vitamin D, Ergocalciferol, (DRISDOL) 1.25 MG (50000 UNIT) CAPS capsule [Pharmacy Med Name: VITAMIN D2 (ERGO) 1.25MG   CAP] 12 capsule 0    Sig: Take 1 capsule by mouth once a week      Endocrinology:  Vitamins - Vitamin D Supplementation Failed - 05/21/2020  6:59 PM      Failed - 50,000 IU strengths are not delegated      Failed - Phosphate in normal range and within 360 days    No results found for: PHOS        Failed - Vitamin D in normal range and within 360 days    Vit D, 25-Hydroxy  Date Value Ref Range Status  12/05/2016 19.0 (L) 30.0 - 100.0 ng/mL Final    Comment:    Vitamin D deficiency has been defined by the Institute of Medicine and an Endocrine Society practice guideline as a level of serum 25-OH vitamin D less than 20 ng/mL (1,2). The Endocrine Society went on to further define vitamin D insufficiency as a level between 21 and 29 ng/mL (2). 1. IOM (Institute of Medicine). 2010. Dietary reference    intakes for calcium and D. [place]: The    [employer]. 2. Holick MF, Binkley [place], Bischoff-Ferrari HA, et al.    Evaluation, treatment, and prevention of vitamin D    deficiency: an Endocrine Society clinical practice    guideline. JCEM. 2011 Jul; 96(7):1911-30.           Passed - Ca in normal range and within 360 days    Calcium  Date Value Ref Range Status  01/07/2020 9.9 8.7 - 10.3 mg/dL Final   Calcium, Total  Date Value Ref Range Status  07/11/2012 9.8 8.5 - 10.1 mg/dL Final          Passed - Valid encounter within last 12 months    Recent Outpatient Visits           1 month ago Essential hypertension   [facility] Maple Hudson., MD   4 months ago Annual physical exam   [facility] Maple Hudson., MD   1 year ago Essential hypertension   [facility] Maple Hudson., MD   1 year ago Essential hypertension   [facility] Maple Hudson., MD   2 years ago Swelling of both lower extremities   [facility] Maple Hudson., MD       Future Appointments             In 4 months Maple Hudson., MD [facility]              carvedilol (COREG) 12.5 MG tablet [Pharmacy Med Name: Carvedilol 12.5 MG Oral Tablet] 360 tablet 0    Sig: Take 2 tablets by mouth twice daily      Cardiovascular:  Beta Blockers Passed - 05/21/2020  6:59 PM      Passed - Last BP in normal range    BP Readings from Last 1 Encounters:  04/02/20 125/69          Passed - Last Heart Rate in normal range  Pulse Readings from Last 1 Encounters:  04/02/20 61          Passed - Valid encounter within last 6 months    Recent Outpatient Visits           1 month ago Essential hypertension   [facility] Maple Hudson., MD   4 months ago Annual physical exam   [facility] Maple Hudson., MD   1 year ago Essential hypertension   [facility] Maple Hudson., MD   1 year ago Essential hypertension   [facility] Maple Hudson., MD   2 years ago Swelling of both lower extremities   [facility] Maple Hudson., MD       Future Appointments             In 4 months Maple Hudson., MD [facility]

## 2020-06-12 DIAGNOSIS — N183 Chronic kidney disease, stage 3 unspecified: Secondary | ICD-10-CM | POA: Diagnosis not present

## 2020-06-12 DIAGNOSIS — E103313 Type 1 diabetes mellitus with moderate nonproliferative diabetic retinopathy with macular edema, bilateral: Secondary | ICD-10-CM | POA: Diagnosis not present

## 2020-06-12 DIAGNOSIS — E1022 Type 1 diabetes mellitus with diabetic chronic kidney disease: Secondary | ICD-10-CM | POA: Diagnosis not present

## 2020-06-19 DIAGNOSIS — E1065 Type 1 diabetes mellitus with hyperglycemia: Secondary | ICD-10-CM | POA: Diagnosis not present

## 2020-07-19 DIAGNOSIS — E1065 Type 1 diabetes mellitus with hyperglycemia: Secondary | ICD-10-CM | POA: Diagnosis not present

## 2020-08-12 ENCOUNTER — Other Ambulatory Visit: Payer: Self-pay | Admitting: Family Medicine

## 2020-08-12 NOTE — Telephone Encounter (Signed)
Requested medication (s) are due for refill today: amount not specified  Requested medication (s) are on the active medication list:  No, not on current med list  Last refill: 04/02/20  Future visit scheduled  yes  09/30/20  Notes to clinic: Historical provider. Med is not on current med list  Requested Prescriptions  Pending Prescriptions Disp Refills   EUTHYROX 150 MCG tablet [Pharmacy Med Name: Euthyrox 150 MCG Oral Tablet] 90 tablet 0    Sig: TAKE 1 TABLET BY MOUTH ONCE DAILY BEFORE BREAKFAST      Endocrinology:  Hypothyroid Agents Failed - 08/12/2020  5:30 AM      Failed - TSH needs to be rechecked within 3 months after an abnormal result. Refill until TSH is due.      Failed - TSH in normal range and within 360 days    TSH  Date Value Ref Range Status  01/07/2020 8.670 (H) 0.450 - 4.500 uIU/mL Final          Passed - Valid encounter within last 12 months    Recent Outpatient Visits           4 months ago Essential hypertension   [facility] Maple Hudson., MD   7 months ago Annual physical exam   [facility] Maple Hudson., MD   1 year ago Essential hypertension   [facility] Maple Hudson., MD   2 years ago Essential hypertension   [facility] Maple Hudson., MD   2 years ago Swelling of both lower extremities   [facility] Maple Hudson., MD       Future Appointments             In 1 month Maple Hudson., MD [facility], [facility]

## 2020-08-13 NOTE — Telephone Encounter (Signed)
Patient states that after she was told to increase her Levothyroxine to 175 mcg daily she did.  However she was unable to tolerate this dose and she said that during her last visit she was told it would be ok to go back to the Levothyroxine 150 mcg.  Patient confirmed with me while on the phone that her current bottle saiy levothyroxine 150 mcg.

## 2020-08-13 NOTE — Telephone Encounter (Signed)
LMTCB to clarify dose 

## 2020-08-17 ENCOUNTER — Ambulatory Visit: Payer: Self-pay

## 2020-08-17 NOTE — Telephone Encounter (Signed)
Pt called for advice what to do to prevent getting covid. Pt's daughter and grandson tested positive.  Pt with h/o diabetes and HTN. Pt has been fully vaccinated including booster. Advised pt to wear a mask  For 10 days and to test for covid on day 5. Advised pt to stay in quarantine until results of covid test are back.  Advised pt not to touch face, wash hands frequently, disinfect hard commonly touched surfaces at least daily. Advised to monitor for any sx especially any change in her breathing or signs of dehydration (dry mouth, not voiding as much, yellow urine, lightheadedness or dizziness. Advised to wear N95 mask if available.  Pt advise to stay at least 6 feet away from everyone and to avoid crowds. Pt verbalized understanding.        Reason for Disposition . General information question, no triage required and triager able to answer question  Answer Assessment - Initial Assessment Questions 1. REASON FOR CALL or QUESTION: "What is your reason for calling today?" or "How can I best help you?" or "What question do you have that I can help answer?"      Pt states her daughter and grandson both tested positive for covid last Thurs.  She wants to know what should do.  Protocols used: INFORMATION ONLY CALL - NO TRIAGE-A-AH

## 2020-08-18 DIAGNOSIS — E1065 Type 1 diabetes mellitus with hyperglycemia: Secondary | ICD-10-CM | POA: Diagnosis not present

## 2020-08-27 DIAGNOSIS — N2581 Secondary hyperparathyroidism of renal origin: Secondary | ICD-10-CM | POA: Diagnosis not present

## 2020-08-27 DIAGNOSIS — R809 Proteinuria, unspecified: Secondary | ICD-10-CM | POA: Diagnosis not present

## 2020-08-27 DIAGNOSIS — N1832 Chronic kidney disease, stage 3b: Secondary | ICD-10-CM | POA: Diagnosis not present

## 2020-09-01 ENCOUNTER — Other Ambulatory Visit: Payer: Self-pay | Admitting: Family Medicine

## 2020-09-01 DIAGNOSIS — E875 Hyperkalemia: Secondary | ICD-10-CM | POA: Diagnosis not present

## 2020-09-01 DIAGNOSIS — N1832 Chronic kidney disease, stage 3b: Secondary | ICD-10-CM | POA: Diagnosis not present

## 2020-09-01 DIAGNOSIS — I129 Hypertensive chronic kidney disease with stage 1 through stage 4 chronic kidney disease, or unspecified chronic kidney disease: Secondary | ICD-10-CM | POA: Diagnosis not present

## 2020-09-01 DIAGNOSIS — R809 Proteinuria, unspecified: Secondary | ICD-10-CM | POA: Diagnosis not present

## 2020-09-17 DIAGNOSIS — M25561 Pain in right knee: Secondary | ICD-10-CM | POA: Diagnosis not present

## 2020-09-17 DIAGNOSIS — M1711 Unilateral primary osteoarthritis, right knee: Secondary | ICD-10-CM | POA: Diagnosis not present

## 2020-09-17 DIAGNOSIS — E1065 Type 1 diabetes mellitus with hyperglycemia: Secondary | ICD-10-CM | POA: Diagnosis not present

## 2020-09-17 DIAGNOSIS — Z96652 Presence of left artificial knee joint: Secondary | ICD-10-CM | POA: Diagnosis not present

## 2020-09-17 DIAGNOSIS — G5603 Carpal tunnel syndrome, bilateral upper limbs: Secondary | ICD-10-CM | POA: Diagnosis not present

## 2020-09-30 ENCOUNTER — Ambulatory Visit: Payer: Self-pay | Admitting: Family Medicine

## 2020-10-01 ENCOUNTER — Encounter: Payer: Self-pay | Admitting: Family Medicine

## 2020-10-01 ENCOUNTER — Ambulatory Visit (INDEPENDENT_AMBULATORY_CARE_PROVIDER_SITE_OTHER): Payer: Medicare Other | Admitting: Family Medicine

## 2020-10-01 ENCOUNTER — Other Ambulatory Visit: Payer: Self-pay

## 2020-10-01 VITALS — BP 138/68 | HR 63 | Temp 98.2°F | Resp 16 | Wt 217.0 lb

## 2020-10-01 DIAGNOSIS — E1065 Type 1 diabetes mellitus with hyperglycemia: Secondary | ICD-10-CM

## 2020-10-01 DIAGNOSIS — Z6836 Body mass index (BMI) 36.0-36.9, adult: Secondary | ICD-10-CM

## 2020-10-01 DIAGNOSIS — M170 Bilateral primary osteoarthritis of knee: Secondary | ICD-10-CM | POA: Diagnosis not present

## 2020-10-01 DIAGNOSIS — G35 Multiple sclerosis: Secondary | ICD-10-CM

## 2020-10-01 DIAGNOSIS — N1831 Chronic kidney disease, stage 3a: Secondary | ICD-10-CM

## 2020-10-01 DIAGNOSIS — I10 Essential (primary) hypertension: Secondary | ICD-10-CM

## 2020-10-01 NOTE — Progress Notes (Signed)
Established patient visit   Patient: Mary [name]   DOB: 1945-10-15   75 y.o. Female  MRN: [id number] Visit Date: 10/01/2020  Today's healthcare provider: [name], MD   Chief Complaint  Patient presents with   Hypertension   Subjective    HPI  Overall patient is feeling well.  Has had Covid vaccines plus booster. Hypertension, follow-up  BP Readings from Last 3 Encounters:  10/01/20 138/68  04/02/20 125/69  01/01/20 (!) 164/78   Wt Readings from Last 3 Encounters:  10/01/20 217 lb (98.4 kg)  04/02/20 215 lb (97.5 kg)  01/01/20 226 lb (102.5 kg)     She was last seen for hypertension 6 months ago.  BP at that visit was 125/69. Management since that visit includes; Good control. She reports good compliance with treatment. She is not having side effects.  She is not exercising. She is adherent to low salt diet.   Outside blood pressures are not being checked.  She does not smoke.  Use of agents associated with hypertension: none.       Medications: Outpatient Medications Prior to Visit  Medication Sig   acetaminophen (TYLENOL) 650 MG CR tablet Take 1,300 mg by mouth every 8 (eight) hours as needed for pain.   atorvastatin (LIPITOR) 10 MG tablet Take 1 tablet (10 mg total) by mouth daily.   ACCU-CHEK AVIVA PLUS test strip USE TEST STRIP(S) TO CHECK GLUCOSE 4 TIMES DAILY (Patient not taking: No sig reported)   carvedilol (COREG) 12.5 MG tablet Take 2 tablets by mouth twice daily   Cyanocobalamin (VITAMIN B-12) 2500 MCG SUBL Place under the tongue daily.   diclofenac Sodium (VOLTAREN) 1 % GEL Apply 2 g topically 4 (four) times daily.    enoxaparin (LOVENOX) 40 MG/0.4ML injection Inject 0.4 mLs (40 mg total) into the skin daily for 14 days. (Patient not taking: Reported on 12/31/2018)   EUTHYROX 150 MCG tablet TAKE 1 TABLET BY MOUTH ONCE DAILY BEFORE BREAKFAST   FLUoxetine (PROZAC) 20 MG capsule TAKE 1 CAPSULE BY MOUTH ONCE DAILY *MUST  KEEP   UPCOMING  APPT  FOR  FURTHER  REFILLS**   furosemide (LASIX) 20 MG tablet Take 1 tablet (20 mg total) by mouth daily.   insulin aspart (NOVOLOG FLEXPEN) 100 UNIT/ML FlexPen Inject 4 Units into the skin 2 (two) times daily before lunch and supper. (Patient not taking: Reported on 04/02/2020)   insulin glargine (LANTUS SOLOSTAR) 100 UNIT/ML Solostar Pen Inject 32 Units into the skin at bedtime.    insulin lispro (HUMALOG KWIKPEN) 100 UNIT/ML KiwkPen Inject 2-10 Units into the skin 4 (four) times daily -  before meals and at bedtime.    Insulin Pen Needle 31G X 8 MM MISC by Does not apply route.   oxyCODONE (OXY IR/ROXICODONE) 5 MG immediate release tablet Take 1 tablet (5 mg total) by mouth every 4 (four) hours as needed for moderate pain (pain score 4-6). (Patient not taking: Reported on 04/02/2020)   quinapril (ACCUPRIL) 20 MG tablet Take 1 tablet (20 mg total) by mouth daily.   traMADol (ULTRAM) 50 MG tablet Take 1-2 tablets (50-100 mg total) by mouth every 6 (six) hours as needed for moderate pain. (Patient not taking: Reported on 04/02/2020)   Vitamin D, Ergocalciferol, (DRISDOL) 1.25 MG (50000 UNIT) CAPS capsule Take 1 capsule by mouth once a week   No facility-administered medications prior to visit.    Review of Systems  Constitutional: Negative for appetite change,  chills, fatigue and fever.  Respiratory: Negative for chest tightness and shortness of breath.   Cardiovascular: Negative for chest pain and palpitations.  Gastrointestinal: Negative for abdominal pain, nausea and vomiting.  Neurological: Negative for dizziness and weakness.        Objective    BP 138/68    Pulse 63    Temp 98.2 F (36.8 C)    Resp 16    Wt 217 lb (98.4 kg)    BMI 36.11 kg/m  BP Readings from Last 3 Encounters:  10/01/20 138/68  04/02/20 125/69  01/01/20 (!) 164/78   Wt Readings from Last 3 Encounters:  10/01/20 217 lb (98.4 kg)  04/02/20 215 lb (97.5 kg)  01/01/20 226 lb (102.5 kg)        Physical Exam Vitals reviewed.  Constitutional:      Appearance: She is well-developed. She is obese.  HENT:     Head: Normocephalic and atraumatic.  Eyes:     General: No scleral icterus.    Conjunctiva/sclera: Conjunctivae normal.  Cardiovascular:     Rate and Rhythm: Normal rate and regular rhythm.     Heart sounds: Normal heart sounds.  Pulmonary:     Effort: Pulmonary effort is normal.     Breath sounds: Normal breath sounds.  Abdominal:     Palpations: Abdomen is soft.  Musculoskeletal:     Comments: Trace pedal edema--left greater than right.  Skin:    General: Skin is warm and dry.  Neurological:     Mental Status: She is alert and oriented to person, place, and time.  Psychiatric:        Mood and Affect: Mood normal.        Behavior: Behavior normal.        Thought Content: Thought content normal.        Judgment: Judgment normal.       No results found for any visits on 10/01/20.  Assessment & Plan     1. Uncontrolled type 1 diabetes mellitus with hyperglycemia (HCC) Followed by endocrinology  2. Primary hypertension Under good control.  3. Primary osteoarthritis of both knees   4. Multiple sclerosis (HCC) Burned out in remission.  5. Chronic kidney disease (CKD) stage G3a/A1, moderately decreased glomerular filtration rate (GFR) between 45-59 mL/min/1.73 square meter and albuminuria creatinine ratio less than 30 mg/g (HCC) Follow labs routinely.  6. Class 2 severe obesity due to excess calories with serious comorbidity and body mass index (BMI) of 36.0 to 36.9 in adult [facility]) Diet and exercise stressed.   No follow-ups on file.      I, [name], MD, have reviewed all documentation for this visit. The documentation on 10/03/20 for the exam, diagnosis, procedures, and orders are all accurate and complete.    Javaris Wigington Wendelyn Breslow, MD  [facility] [phone] (phone) [phone] (fax)  [facility]

## 2020-10-17 DIAGNOSIS — E1065 Type 1 diabetes mellitus with hyperglycemia: Secondary | ICD-10-CM | POA: Diagnosis not present

## 2020-10-18 LAB — HM DIABETES EYE EXAM

## 2020-10-19 DIAGNOSIS — N183 Chronic kidney disease, stage 3 unspecified: Secondary | ICD-10-CM | POA: Diagnosis not present

## 2020-10-19 DIAGNOSIS — E1022 Type 1 diabetes mellitus with diabetic chronic kidney disease: Secondary | ICD-10-CM | POA: Diagnosis not present

## 2020-10-24 ENCOUNTER — Other Ambulatory Visit: Payer: Self-pay | Admitting: Family Medicine

## 2020-10-24 DIAGNOSIS — F329 Major depressive disorder, single episode, unspecified: Secondary | ICD-10-CM

## 2020-10-24 NOTE — Telephone Encounter (Signed)
Requested Prescriptions  Pending Prescriptions Disp Refills  . FLUoxetine (PROZAC) 20 MG capsule [Pharmacy Med Name: FLUoxetine HCl 20 MG Oral Capsule] 90 capsule 0    Sig: TAKE 1 CAPSULE BY MOUTH ONCE DAILY. MUST KEEP UPCOMING APPOINTMENT FOR FURTHER REFILLS.     Psychiatry:  Antidepressants - SSRI Passed - 10/24/2020  6:38 PM      Passed - Completed PHQ-2 or PHQ-9 in the last 360 days      Passed - Valid encounter within last 6 months    Recent Outpatient Visits          3 weeks ago Uncontrolled type 1 diabetes mellitus with hyperglycemia [facility])   [facility] Maple Hudson., MD   6 months ago Essential hypertension   [facility] Maple Hudson., MD   9 months ago Annual physical exam   [facility] Maple Hudson., MD   1 year ago Essential hypertension   [facility] Maple Hudson., MD   2 years ago Essential hypertension   [facility] Maple Hudson., MD      Future Appointments            In 2 months Maple Hudson., MD [facility]

## 2020-10-26 DIAGNOSIS — E1022 Type 1 diabetes mellitus with diabetic chronic kidney disease: Secondary | ICD-10-CM | POA: Diagnosis not present

## 2020-10-26 DIAGNOSIS — N183 Chronic kidney disease, stage 3 unspecified: Secondary | ICD-10-CM | POA: Diagnosis not present

## 2020-10-26 DIAGNOSIS — E103313 Type 1 diabetes mellitus with moderate nonproliferative diabetic retinopathy with macular edema, bilateral: Secondary | ICD-10-CM | POA: Diagnosis not present

## 2020-10-26 DIAGNOSIS — E1042 Type 1 diabetes mellitus with diabetic polyneuropathy: Secondary | ICD-10-CM | POA: Diagnosis not present

## 2020-11-16 DIAGNOSIS — E1065 Type 1 diabetes mellitus with hyperglycemia: Secondary | ICD-10-CM | POA: Diagnosis not present

## 2020-11-25 ENCOUNTER — Other Ambulatory Visit: Payer: Self-pay | Admitting: Family Medicine

## 2020-11-25 NOTE — Telephone Encounter (Signed)
Requested medication (s) are due for refill today:   Yes  Requested medication (s) are on the active medication list:   Yes  Future visit scheduled:   Yes   Last ordered: 3 months ago, #90, 0 refills  Returned because failed protocol due to lab work needed.   Requested Prescriptions  Pending Prescriptions Disp Refills   EUTHYROX 150 MCG tablet [Pharmacy Med Name: Euthyrox 150 MCG Oral Tablet] 90 tablet 0    Sig: TAKE 1 TABLET BY MOUTH ONCE DAILY BEFORE BREAKFAST      Endocrinology:  Hypothyroid Agents Failed - 11/25/2020 10:17 AM      Failed - TSH needs to be rechecked within 3 months after an abnormal result. Refill until TSH is due.      Failed - TSH in normal range and within 360 days    TSH  Date Value Ref Range Status  01/07/2020 8.670 (H) 0.450 - 4.500 uIU/mL Final          Passed - Valid encounter within last 12 months    Recent Outpatient Visits           1 month ago Uncontrolled type 1 diabetes mellitus with hyperglycemia [facility])   [facility] Maple Hudson., MD   7 months ago Essential hypertension   [facility] Maple Hudson., MD   10 months ago Annual physical exam   [facility] Maple Hudson., MD   1 year ago Essential hypertension   [facility] Maple Hudson., MD   2 years ago Essential hypertension   [facility] Maple Hudson., MD       Future Appointments             In 1 month Maple Hudson., MD [facility], [facility]

## 2020-12-11 ENCOUNTER — Other Ambulatory Visit: Payer: Self-pay | Admitting: Family Medicine

## 2020-12-16 DIAGNOSIS — E1065 Type 1 diabetes mellitus with hyperglycemia: Secondary | ICD-10-CM | POA: Diagnosis not present

## 2020-12-30 DIAGNOSIS — E1065 Type 1 diabetes mellitus with hyperglycemia: Secondary | ICD-10-CM | POA: Diagnosis not present

## 2021-01-03 NOTE — Discharge Instructions (Signed)
Instructions after Total Knee Replacement   Mary [name] P. Tyliek Tim[place]man, Jr., M.D.     Dept. of Orthopaedics & Sports Medicine  [facility]  [address]  [place], [place]  [address]  Phone: [phone]   Fax: [phone]    DIET: Drink plenty of non-alcoholic fluids. Resume your normal diet. Include foods high in fiber.  ACTIVITY:  You may use crutches or a walker with weight-bearing as tolerated, unless instructed otherwise. You may be weaned off of the walker or crutches by your Physical Therapist.  Do NOT place pillows under the knee. Anything placed under the knee could limit your ability to straighten the knee.   Continue doing gentle exercises. Exercising will reduce the pain and swelling, increase motion, and prevent muscle weakness.   Please continue to use the TED compression stockings for 6 weeks. You may remove the stockings at night, but should reapply them in the morning. Do not drive or operate any equipment until instructed.  WOUND CARE:  Continue to use the PolarCare or ice packs periodically to reduce pain and swelling. You may bathe or shower after the staples are removed at the first office visit following surgery.  MEDICATIONS: You may resume your regular medications. Please take the pain medication as prescribed on the medication. Do not take pain medication on an empty stomach. You have been given a prescription for a blood thinner (Lovenox or Coumadin). Please take the medication as instructed. (NOTE: After completing a 2 week course of Lovenox, take one Enteric-coated aspirin once a day. This along with elevation will help reduce the possibility of phlebitis in your operated leg.) Do not drive or drink alcoholic beverages when taking pain medications.  CALL THE OFFICE FOR: Temperature above 101 degrees Excessive bleeding or drainage on the dressing. Excessive swelling, coldness, or paleness of the toes. Persistent nausea and vomiting.  FOLLOW-UP:  You  should have an appointment to return to the office in 10-14 days after surgery. Arrangements have been made for continuation of Physical Therapy (either home therapy or outpatient therapy).   [facility] Department Directory         [email or web address]       [email or web address]          Cardiology  Appointments: [place] - [phone] [place] - [phone]  Endocrinology  Appointments: [place] - [phone] [place] - [phone]  Gastroenterology  Appointments: [place] - [phone] [place] - [phone]        General Surgery   Appointments: [place] - [phone]  Internal Medicine/Family Medicine  Appointments: [place] - [phone] [place] - [phone] [place] - [phone]  Metabolic and Weigh Loss Surgery  Appointments: [place] - [phone]        Neurology  Appointments: [place] - [phone] [place] - [phone]  Neurosurgery  Appointments: [place] - [phone]  Obstetrics & Gynecology  Appointments: [place] - [phone] [place] - [phone]        Pediatrics  Appointments: [place] - [phone] [place] - [phone]  Physiatry  Appointments: [place] -[phone]  Physical Therapy  Appointments: [place] - [phone] [place] - [phone]        Podiatry  Appointments: [place] - [phone] [place] - [phone]  Pulmonology  Appointments: [place] - [phone]  Rheumatology  Appointments: [place] - [phone]        [place] Location: [facility]  [address] [place], [place]  [address]  [place] Location: [facility] [address] [place], [place]  [address]  [place] Location: [facility] [address] [place], [place]  [address]    

## 2021-01-12 ENCOUNTER — Other Ambulatory Visit: Payer: Self-pay

## 2021-01-12 ENCOUNTER — Ambulatory Visit (INDEPENDENT_AMBULATORY_CARE_PROVIDER_SITE_OTHER): Payer: Medicare Other | Admitting: Family Medicine

## 2021-01-12 ENCOUNTER — Encounter: Payer: Self-pay | Admitting: Family Medicine

## 2021-01-12 VITALS — BP 130/81 | HR 60 | Temp 98.5°F | Resp 16 | Ht 65.0 in | Wt 214.0 lb

## 2021-01-12 DIAGNOSIS — E1065 Type 1 diabetes mellitus with hyperglycemia: Secondary | ICD-10-CM

## 2021-01-12 DIAGNOSIS — Z6836 Body mass index (BMI) 36.0-36.9, adult: Secondary | ICD-10-CM | POA: Diagnosis not present

## 2021-01-12 DIAGNOSIS — N1831 Chronic kidney disease, stage 3a: Secondary | ICD-10-CM | POA: Diagnosis not present

## 2021-01-12 DIAGNOSIS — I10 Essential (primary) hypertension: Secondary | ICD-10-CM | POA: Diagnosis not present

## 2021-01-12 DIAGNOSIS — B3731 Acute candidiasis of vulva and vagina: Secondary | ICD-10-CM

## 2021-01-12 DIAGNOSIS — B373 Candidiasis of vulva and vagina: Secondary | ICD-10-CM

## 2021-01-12 DIAGNOSIS — Z0000 Encounter for general adult medical examination without abnormal findings: Secondary | ICD-10-CM | POA: Diagnosis not present

## 2021-01-12 DIAGNOSIS — E7849 Other hyperlipidemia: Secondary | ICD-10-CM

## 2021-01-12 DIAGNOSIS — E039 Hypothyroidism, unspecified: Secondary | ICD-10-CM

## 2021-01-12 MED ORDER — FLUCONAZOLE 150 MG PO TABS: 150.0000 mg | ORAL_TABLET | Freq: Once | ORAL | 0 refills | Status: AC

## 2021-01-12 NOTE — Progress Notes (Signed)
I,Mary [name],acting as a scribe for Mary Mans, MD.,have documented all relevant documentation on the behalf of Mary Mans, MD,as directed by  Mary Mans, MD while in the presence of Mary Mans, MD.   Annual Wellness Visit     Patient: Mary [name], Female    DOB: 01/16/1946, 75 y.o.   MRN: [id number] Visit Date: 01/12/2021  Today's Provider: [name], MD   Chief Complaint  Patient presents with   Medicare Wellness   Subjective    Mary [name] is a 75 y.o. female who presents today for her Annual Wellness Visit.CPE She reports consuming a  low carb  diet. Home exercise routine includes biking and some walking. She generally feels fairly well. She reports sleeping well. She does not have additional problems to discuss today.   HPI       Medications: Outpatient Medications Prior to Visit  Medication Sig   acetaminophen (TYLENOL) 650 MG CR tablet Take 1,300 mg by mouth every 8 (eight) hours as needed for pain.   atorvastatin (LIPITOR) 10 MG tablet Take 1 tablet (10 mg total) by mouth daily.   Blood Glucose Monitoring Suppl (ONETOUCH VERIO FLEX SYSTEM) w/Device KIT USE TO CHECK GLUCOSE 4 TIMES DAILY   carvedilol (COREG) 12.5 MG tablet Take 2 tablets by mouth twice daily (Patient taking differently: Take 25 mg by mouth 2 (two) times daily with a meal.)   Cyanocobalamin (VITAMIN B-12) 2500 MCG SUBL Place 2,500 mcg under the tongue daily.   diclofenac Sodium (VOLTAREN) 1 % GEL Apply 2 g topically 4 (four) times daily as needed (pain).   EUTHYROX 150 MCG tablet TAKE 1 TABLET BY MOUTH ONCE DAILY BEFORE BREAKFAST (Patient taking differently: Take 150 mcg by mouth daily before breakfast.)   FLUoxetine (PROZAC) 20 MG capsule TAKE 1 CAPSULE BY MOUTH ONCE DAILY. MUST KEEP UPCOMING APPOINTMENT FOR FURTHER REFILLS. (Patient taking differently: Take 20 mg by mouth daily.)   furosemide (LASIX) 20 MG tablet Take 1 tablet (20 mg total) by mouth  daily.   insulin glargine (LANTUS SOLOSTAR) 100 UNIT/ML Solostar Pen Inject 32 Units into the skin at bedtime.    insulin lispro (HUMALOG) 100 UNIT/ML KiwkPen Inject 2-10 Units into the skin 4 (four) times daily -  before meals and at bedtime. Per sliding scale   Insulin Pen Needle 31G X 8 MM MISC by Does not apply route.   Lancets (ONETOUCH DELICA PLUS LANCET33G) MISC USE TO CHECK GLUCOSE 4 TIMES DAILY   OneTouch Delica Lancets 33G MISC Use 4 (four) times daily   quinapril (ACCUPRIL) 20 MG tablet Take 1 tablet (20 mg total) by mouth daily.   Vitamin D, Ergocalciferol, (DRISDOL) 1.25 MG (50000 UNIT) CAPS capsule Take 1 capsule by mouth once a week (Patient taking differently: Take 50,000 Units by mouth every Sunday.)   ACCU-CHEK AVIVA PLUS test strip USE TEST STRIP(S) TO CHECK GLUCOSE 4 TIMES DAILY (Patient not taking: No sig reported)   No facility-administered medications prior to visit.    Allergies  Allergen Reactions   Hydrocodone Itching   Oxycodone Hcl Itching    Patient Care Team: Maple Hudson., MD as PCP - General (Family Medicine) Lamont Dowdy, MD as Consulting Physician (Internal Medicine) Tedd Sias, Marlana Salvage, MD as Physician Assistant (Endocrinology) Ernest Pine, Illene Labrador, MD (Orthopedic Surgery) Pa, [facility] (Optometry)  Review of Systems  Constitutional:  Positive for fatigue.  Endocrine: Positive for cold intolerance.  Genitourinary:  Positive for  urgency.  Musculoskeletal:  Positive for arthralgias.  Neurological:  Positive for numbness.  All other systems reviewed and are negative.       Objective    Vitals: BP 130/81 (BP Location: Left Arm, Patient Position: Sitting, Cuff Size: Large)   Pulse 60   Temp 98.5 F (36.9 C) (Oral)   Resp 16   Ht $R'5\' 5"'IN$  (1.651 m)   Wt 214 lb (97.1 kg)   SpO2 98%   BMI 35.61 kg/m  BP Readings from Last 3 Encounters:  01/12/21 130/81  10/01/20 138/68  04/02/20 125/69   Wt Readings from Last 3 Encounters:   01/15/21 214 lb (97.1 kg)  01/12/21 214 lb (97.1 kg)  10/01/20 217 lb (98.4 kg)      Physical Exam Vitals and nursing note reviewed. Exam conducted with a chaperone present.  Constitutional:      Appearance: Normal appearance.  HENT:     Head:     Jaw: There is normal jaw occlusion.     Right Ear: Tympanic membrane normal.     Left Ear: Tympanic membrane normal.     Mouth/Throat:     Mouth: Mucous membranes are moist.     Pharynx: Oropharynx is clear.  Eyes:     Conjunctiva/sclera: Conjunctivae normal.  Cardiovascular:     Rate and Rhythm: Normal rate and regular rhythm.     Pulses: Normal pulses.     Heart sounds: Normal heart sounds.  Pulmonary:     Effort: Pulmonary effort is normal.     Breath sounds: Normal breath sounds.  Chest:  Breasts:    Right: Normal.     Left: Normal.  Abdominal:     General: Abdomen is flat. Bowel sounds are normal.     Palpations: Abdomen is soft.  Genitourinary:    General: Normal vulva.     Comments: Bimanual exam benign. Musculoskeletal:        General: Normal range of motion.     Cervical back: Normal range of motion and neck supple.  Skin:    General: Skin is warm and dry.  Neurological:     General: No focal deficit present.     Mental Status: She is alert.  Psychiatric:        Attention and Perception: Attention and perception normal.        Mood and Affect: Mood normal.        Speech: Speech normal.        Behavior: Behavior normal. Behavior is cooperative.        Thought Content: Thought content normal.        Cognition and Memory: Cognition and memory normal.        Judgment: Judgment normal.     Most recent functional status assessment: In your present state of health, do you have any difficulty performing the following activities: 01/15/2021  Hearing? N  Vision? N  Difficulty concentrating or making decisions? N  Walking or climbing stairs? Y  Comment slow climb d/t status of knee  Dressing or bathing? N   Doing errands, shopping? N  Some recent data might be hidden   Most recent fall risk assessment: Fall Risk  01/12/2021  Falls in the past year? 0  Number falls in past yr: 0  Injury with Fall? 0  Risk for fall due to : Impaired mobility;Impaired balance/gait;Orthopedic patient  Risk for fall due to: Comment -  Follow up Falls evaluation completed    Most recent depression screenings: PHQ  2/9 Scores 01/12/2021 10/01/2020  PHQ - 2 Score 1 0  PHQ- 9 Score 7 0   Most recent cognitive screening: 6CIT Screen 10/06/2016  What Year? 0 points  What month? 0 points  What time? 0 points  Count back from 20 0 points  Months in reverse 0 points  Repeat phrase 2 points  Total Score 2   Most recent Audit-C alcohol use screening Alcohol Use Disorder Test (AUDIT) 01/12/2021  1. How often do you have a drink containing alcohol? 1  2. How many drinks containing alcohol do you have on a typical day when you are drinking? 0  3. How often do you have six or more drinks on one occasion? 0  AUDIT-C Score 1  Alcohol Brief Interventions/Follow-up -   A score of 3 or more in women, and 4 or more in men indicates increased risk for alcohol abuse, EXCEPT if all of the points are from question 1   Results for orders placed or performed in visit on 01/12/21  CBC with Differential/Platelet  Result Value Ref Range   WBC 8.7 3.4 - 10.8 x10E3/uL   RBC 4.12 3.77 - 5.28 x10E6/uL   Hemoglobin 12.9 11.1 - 15.9 g/dL   Hematocrit 38.4 34.0 - 46.6 %   MCV 93 79 - 97 fL   MCH 31.3 26.6 - 33.0 pg   MCHC 33.6 31.5 - 35.7 g/dL   RDW 11.9 11.7 - 15.4 %   Platelets 185 150 - 450 x10E3/uL   Neutrophils 59 Not Estab. %   Lymphs 30 Not Estab. %   Monocytes 8 Not Estab. %   Eos 2 Not Estab. %   Basos 1 Not Estab. %   Neutrophils Absolute 5.2 1.4 - 7.0 x10E3/uL   Lymphocytes Absolute 2.6 0.7 - 3.1 x10E3/uL   Monocytes Absolute 0.7 0.1 - 0.9 x10E3/uL   EOS (ABSOLUTE) 0.1 0.0 - 0.4 x10E3/uL   Basophils Absolute 0.0  0.0 - 0.2 x10E3/uL   Immature Granulocytes 0 Not Estab. %   Immature Grans (Abs) 0.0 0.0 - 0.1 x10E3/uL  Comprehensive metabolic panel  Result Value Ref Range   Glucose 109 (H) 65 - 99 mg/dL   BUN 42 (H) 8 - 27 mg/dL   Creatinine, Ser 1.57 (H) 0.57 - 1.00 mg/dL   eGFR 34 (L) >59 mL/min/1.73   BUN/Creatinine Ratio 27 12 - 28   Sodium 144 134 - 144 mmol/L   Potassium 4.7 3.5 - 5.2 mmol/L   Chloride 101 96 - 106 mmol/L   CO2 24 20 - 29 mmol/L   Calcium 10.2 8.7 - 10.3 mg/dL   Total Protein 7.1 6.0 - 8.5 g/dL   Albumin 3.9 3.7 - 4.7 g/dL   Globulin, Total 3.2 1.5 - 4.5 g/dL   Albumin/Globulin Ratio 1.2 1.2 - 2.2   Bilirubin Total 0.3 0.0 - 1.2 mg/dL   Alkaline Phosphatase 99 44 - 121 IU/L   AST 16 0 - 40 IU/L   ALT 12 0 - 32 IU/L  Hemoglobin A1c  Result Value Ref Range   Hgb A1c MFr Bld 7.4 (H) 4.8 - 5.6 %   Est. average glucose Bld gHb Est-mCnc 166 mg/dL  Lipid panel  Result Value Ref Range   Cholesterol, Total 182 100 - 199 mg/dL   Triglycerides 76 0 - 149 mg/dL   HDL 79 >39 mg/dL   VLDL Cholesterol Cal 14 5 - 40 mg/dL   LDL Chol Calc (NIH) 89 0 - 99 mg/dL  Chol/HDL Ratio 2.3 0.0 - 4.4 ratio  TSH  Result Value Ref Range   TSH 1.280 0.450 - 4.500 uIU/mL    Assessment & Plan     Annual wellness visit done today including the all of the following: Reviewed patient's Family Medical History Reviewed and updated list of patient's medical providers Assessment of cognitive impairment was done Assessed patient's functional ability Established a written schedule for health screening [place] Completed and Reviewed  Exercise Activities and Dietary recommendations  Goals      Cut out extra servings     Recommend to continue current diet plan of cutting out carbohydrates and sugars in daily diet.          Immunization History  Administered Date(s) Administered   Fluad Quad(high Dose 65+) 04/16/2019, 04/02/2020   Influenza, High Dose Seasonal PF  04/08/2015, 04/22/2016, 05/26/2018   PFIZER(Purple Top)SARS-COV-2 Vaccination 09/26/2019, 10/17/2019, 05/15/2020   Pneumococcal Conjugate-13 06/03/2014   Pneumococcal Polysaccharide-23 06/06/2012   Tdap 12/06/2012   Zoster, Live 12/06/2012    Health Maintenance  Topic Date Due   Zoster Vaccines- Shingrix (1 of 2) Never done   MAMMOGRAM  01/12/2019   OPHTHALMOLOGY EXAM  05/19/2020   COVID-19 Vaccine (4 - Booster for Pfizer series) 09/15/2020   FOOT EXAM  12/31/2020   INFLUENZA VACCINE  02/22/2021   HEMOGLOBIN A1C  07/14/2021   TETANUS/TDAP  12/07/2022   DEXA SCAN  04/02/2024   COLONOSCOPY (Pts 45-20yrs Insurance coverage will need to be confirmed)  03/18/2026   Hepatitis C Screening  Completed   PNA vac Low Risk Adult  Completed   HPV VACCINES  Aged Out   1. Encounter for Medicare annual wellness exam   2. Annual physical exam   3. Yeast vaginitis Treat, may need referral to Dr. Kenton Kingfisher from GYN. - fluconazole (DIFLUCAN) 150 MG tablet; Take 1 tablet (150 mg total) by mouth once for 1 dose.  Dispense: 2 tablet; Refill: 0  4. Primary hypertension Good control  5. Class 2 severe obesity due to excess calories with serious comorbidity and body mass index (BMI) of 36.0 to 36.9 in adult [facility]) Diet and exercise stressed. - CBC with Differential/Platelet  6. Chronic kidney disease (CKD) stage G3a/A1, moderately decreased glomerular filtration rate (GFR) between 45-59 mL/min/1.73 square meter and albuminuria creatinine ratio less than 30 mg/g (HCC) Avoid anti-inflammatories and push fluids - Comprehensive metabolic panel  7. Hypothyroidism, unspecified type  - TSH  8. Other hyperlipidemia On Lipitor 10 - Lipid panel  9. Uncontrolled type 1 diabetes mellitus with hyperglycemia ([place]) Followed by endocrinology. - Hemoglobin A1c   Discussed health benefits of physical activity, and encouraged her to engage in regular exercise appropriate for her age and condition.       No follow-ups on file.     I, [name], MD, have reviewed all documentation for this visit. The documentation on 01/17/21 for the exam, diagnosis, procedures, and orders are all accurate and complete.    Jaira Canady Cranford Mon, MD  [facility] [phone] (phone) [phone] (fax)  [place]

## 2021-01-13 LAB — LIPID PANEL
Chol/HDL Ratio: 2.3 ratio (ref 0.0–4.4)
Cholesterol, Total: 182 mg/dL (ref 100–199)
HDL: 79 mg/dL (ref >39)
LDL Chol Calc (NIH): 89 mg/dL (ref 0–99)
Triglycerides: 76 mg/dL (ref 0–149)
VLDL Cholesterol Cal: 14 mg/dL (ref 5–40)

## 2021-01-13 LAB — COMPREHENSIVE METABOLIC PANEL
ALT: 12 IU/L (ref 0–32)
AST: 16 IU/L (ref 0–40)
Albumin/Globulin Ratio: 1.2 (ref 1.2–2.2)
Albumin: 3.9 g/dL (ref 3.7–4.7)
Alkaline Phosphatase: 99 IU/L (ref 44–121)
BUN/Creatinine Ratio: 27 (ref 12–28)
BUN: 42 mg/dL — ABNORMAL HIGH (ref 8–27)
Bilirubin Total: 0.3 mg/dL (ref 0.0–1.2)
CO2: 24 mmol/L (ref 20–29)
Calcium: 10.2 mg/dL (ref 8.7–10.3)
Chloride: 101 mmol/L (ref 96–106)
Creatinine, Ser: 1.57 mg/dL — ABNORMAL HIGH (ref 0.57–1.00)
Globulin, Total: 3.2 g/dL (ref 1.5–4.5)
Glucose: 109 mg/dL — ABNORMAL HIGH (ref 65–99)
Potassium: 4.7 mmol/L (ref 3.5–5.2)
Sodium: 144 mmol/L (ref 134–144)
Total Protein: 7.1 g/dL (ref 6.0–8.5)
eGFR: 34 mL/min/{1.73_m2} — ABNORMAL LOW (ref >59)

## 2021-01-13 LAB — CBC WITH DIFFERENTIAL/PLATELET
Basophils Absolute: 0 10*3/uL (ref 0.0–0.2)
Basos: 1 %
EOS (ABSOLUTE): 0.1 10*3/uL (ref 0.0–0.4)
Eos: 2 %
Hematocrit: 38.4 % (ref 34.0–46.6)
Hemoglobin: 12.9 g/dL (ref 11.1–15.9)
Immature Grans (Abs): 0 10*3/uL (ref 0.0–0.1)
Immature Granulocytes: 0 %
Lymphocytes Absolute: 2.6 10*3/uL (ref 0.7–3.1)
Lymphs: 30 %
MCH: 31.3 pg (ref 26.6–33.0)
MCHC: 33.6 g/dL (ref 31.5–35.7)
MCV: 93 fL (ref 79–97)
Monocytes Absolute: 0.7 10*3/uL (ref 0.1–0.9)
Monocytes: 8 %
Neutrophils Absolute: 5.2 10*3/uL (ref 1.4–7.0)
Neutrophils: 59 %
Platelets: 185 10*3/uL (ref 150–450)
RBC: 4.12 x10E6/uL (ref 3.77–5.28)
RDW: 11.9 % (ref 11.7–15.4)
WBC: 8.7 10*3/uL (ref 3.4–10.8)

## 2021-01-13 LAB — HEMOGLOBIN A1C
Est. average glucose Bld gHb Est-mCnc: 166 mg/dL
Hgb A1c MFr Bld: 7.4 % — ABNORMAL HIGH (ref 4.8–5.6)

## 2021-01-13 LAB — TSH: TSH: 1.28 u[IU]/mL (ref 0.450–4.500)

## 2021-01-15 ENCOUNTER — Other Ambulatory Visit: Payer: Medicare Other

## 2021-01-15 ENCOUNTER — Encounter
Admission: RE | Admit: 2021-01-15 | Discharge: 2021-01-15 | Disposition: A | Discharge status: Home / Self Care | Payer: Medicare Other | Source: Ambulatory Visit | Attending: Orthopedic Surgery | Admitting: Orthopedic Surgery

## 2021-01-15 ENCOUNTER — Other Ambulatory Visit: Payer: Self-pay

## 2021-01-15 DIAGNOSIS — Z01810 Encounter for preprocedural cardiovascular examination: Secondary | ICD-10-CM | POA: Diagnosis not present

## 2021-01-15 DIAGNOSIS — M1711 Unilateral primary osteoarthritis, right knee: Secondary | ICD-10-CM | POA: Insufficient documentation

## 2021-01-15 DIAGNOSIS — Z79899 Other long term (current) drug therapy: Secondary | ICD-10-CM | POA: Insufficient documentation

## 2021-01-15 DIAGNOSIS — E1065 Type 1 diabetes mellitus with hyperglycemia: Secondary | ICD-10-CM | POA: Diagnosis not present

## 2021-01-15 DIAGNOSIS — Z01818 Encounter for other preprocedural examination: Secondary | ICD-10-CM | POA: Insufficient documentation

## 2021-01-15 LAB — URINALYSIS, ROUTINE W REFLEX MICROSCOPIC
Bilirubin Urine: NEGATIVE
Glucose, UA: NEGATIVE mg/dL
Hgb urine dipstick: NEGATIVE
Ketones, ur: NEGATIVE mg/dL
Leukocytes,Ua: NEGATIVE
Nitrite: NEGATIVE
Protein, ur: NEGATIVE mg/dL
Specific Gravity, Urine: 1.018 (ref 1.005–1.030)
pH: 6 (ref 5.0–8.0)

## 2021-01-15 LAB — COMPREHENSIVE METABOLIC PANEL
ALT: 13 U/L (ref 0–44)
AST: 18 U/L (ref 15–41)
Albumin: 3.5 g/dL (ref 3.5–5.0)
Alkaline Phosphatase: 76 U/L (ref 38–126)
Anion gap: 8 (ref 5–15)
BUN: 44 mg/dL — ABNORMAL HIGH (ref 8–23)
CO2: 27 mmol/L (ref 22–32)
Calcium: 9.5 mg/dL (ref 8.9–10.3)
Chloride: 105 mmol/L (ref 98–111)
Creatinine, Ser: 1.26 mg/dL — ABNORMAL HIGH (ref 0.44–1.00)
GFR, Estimated: 45 mL/min — ABNORMAL LOW (ref >60)
Glucose, Bld: 76 mg/dL (ref 70–99)
Potassium: 4 mmol/L (ref 3.5–5.1)
Sodium: 140 mmol/L (ref 135–145)
Total Bilirubin: 0.7 mg/dL (ref 0.3–1.2)
Total Protein: 7.3 g/dL (ref 6.5–8.1)

## 2021-01-15 LAB — TYPE AND SCREEN
ABO/RH(D): O POS
Antibody Screen: NEGATIVE

## 2021-01-15 LAB — APTT: aPTT: 27 seconds (ref 24–36)

## 2021-01-15 LAB — CBC
HCT: 37.9 % (ref 36.0–46.0)
Hemoglobin: 12.5 g/dL (ref 12.0–15.0)
MCH: 30.9 pg (ref 26.0–34.0)
MCHC: 33 g/dL (ref 30.0–36.0)
MCV: 93.6 fL (ref 80.0–100.0)
Platelets: 178 10*3/uL (ref 150–400)
RBC: 4.05 MIL/uL (ref 3.87–5.11)
RDW: 13.2 % (ref 11.5–15.5)
WBC: 6.7 10*3/uL (ref 4.0–10.5)
nRBC: 0 % (ref 0.0–0.2)

## 2021-01-15 LAB — SURGICAL PCR SCREEN
MRSA, PCR: NEGATIVE
Staphylococcus aureus: NEGATIVE

## 2021-01-15 LAB — SEDIMENTATION RATE: Sed Rate: 18 mm/hr (ref 0–30)

## 2021-01-15 LAB — PROTIME-INR
INR: 1 (ref 0.8–1.2)
Prothrombin Time: 13.6 seconds (ref 11.4–15.2)

## 2021-01-15 LAB — C-REACTIVE PROTEIN: CRP: 0.7 mg/dL (ref <1.0)

## 2021-01-15 MED ORDER — CELECOXIB 200 MG PO CAPS: 400.0000 mg | ORAL_CAPSULE | Freq: Once | ORAL | Status: DC

## 2021-01-15 NOTE — Patient Instructions (Signed)
INSTRUCTIONS FOR SURGERY     Your surgery is scheduled for: Wednesday, July 6TH       To find out your arrival time for the day of surgery,          please call [phone] between 1 pm and 3 pm on :  Tuesday, July 5TH     When you arrive for surgery, report to the REGISTRATION DESK ON THE FIRST FLOOR OF  THE MEDICAL MALL. ONCE THEY HAVE FINISHED THEIR PROCESS, PROCEED TO THE SECOND FLOOR AND SIGN IN AT THE SURGERY DESK.    REMEMBER: Instructions that are not followed completely may result in serious medical risk,  up to and including death, or upon the discretion of your surgeon and anesthesiologist,            your surgery may need to be rescheduled.  __X__ 1. Do not eat food after midnight the night before your procedure.                    No gum, candy, lozenger, tic tacs, tums or hard candies.                  ABSOLUTELY NOTHING SOLID IN YOUR MOUTH AFTER MIDNIGHT                    You may drink unlimited clear liquids up to 2 hours before you are scheduled to arrive for surgery.                   Do not drink anything within those 2 hours unless you need to take medicine, then take the                   smallest amount you need.  Clear liquids include:  water, apple juice without pulp,                   any flavor Gatorade, Black coffee, black tea.  Sugar may be added but no dairy/ honey /lemon.                        Broth and jello is not considered a clear liquid.  __x__  2. On the morning of surgery, please brush your teeth with toothpaste and water. You may rinse with                  mouthwash if you wish but DO NOT SWALLOW TOOTHPASTE OR MOUTHWASH  __X___3. NO alcohol for 24 hours before or after surgery.  __x___ 4.  Do NOT smoke or use e-cigarettes for 24 HOURS PRIOR TO SURGERY.                      DO NOT  Use any chewable tobacco products for at least 6 hours prior to surgery.  __x___ 5. If you start any new  medication after this appointment and prior to surgery, please                   Bring it with you on the day of surgery.  ___x__ 6.  Notify your doctor if there is any change in your medical condition, such as fever,                   infection, vomitting, diarrhea or any open sores.  __x___ 7.  USE the CHG SOAP as instructed, the night before surgery and the day of surgery.                   Once you have washed with this soap, do NOT use any of the following: Powders, perfumes                    or lotions. Please do not wear make up, hairpins, clips or nail polish. You MAY wear deodorant.                                                   Women need to shave 48 hours prior to surgery.                   DO NOT wear ANY jewelry on the day of surgery. If there are rings that are too tight to                    remove easily, please address this prior to the surgery day. Piercings need to be removed.                                                                     NO METAL ON YOUR BODY.                    Do NOT bring any valuables.  If you came to Pre-Admit testing then you will not need license,                     insurance card or credit card.  If you will be staying overnight, please either leave your things in                     the car or have your family be responsible for these items.                     [facility] IS NOT RESPONSIBLE FOR BELONGINGS OR VALUABLES.  ___X__ 8. DO NOT wear contact lenses on surgery day.  You may not have dentures,                     Hearing aides, contacts or glasses in the operating room. These items can be                    Placed in the Recovery Room to receive immediately after surgery.  ___x__ 10. Take the following medications on the morning of surgery with a sip of water:                              1.COREG  2.PROZAC                     3.EUTHYROX                     4.  ___X__ 11.  Follow any instructions provided to  you by your surgeon.                        Such as enema, clear liquid bowel prep                      COMPLETE THE G2 DRINK BY 2 HOURS PRIOR TO SURGERY.   __X__  12. STOP ALL ASPIRIN PRODUCTS ONE WEEK PRIOR TO SURGERY.                       THIS INCLUDES BC POWDERS / GOODIES POWDER  __x___ 13. STOP Anti-inflammatories as of ONE WEEK PRIOR TO SURGERY.                      This includes IBUPROFEN / MOTRIN / ADVIL / ALEVE/ NAPROXYN / VOLTAREN                     YOU MAY TAKE TYLENOL ANY TIME PRIOR TO SURGERY.  ___X__ 14.  Stop supplements until after surgery.                     This includes: VITAMIN D // VITAMIN B12 //                      YOU CAN TAKE YOUR ONCE WEEKLY DRISDOL.                 __X____16.  TAKE 1/2 OF USUAL INSULIN DOSE ON THE EVENING PRIOR TO SURGERY.                     Do NOT take any diabetes medications on surgery day.  __X____17.  Continue to take the following medications but do not take on the morning of surgery:                            LASIX // QUINAPRIL  ___X___18. If staying overnight, please have appropriate shoes to wear to be able to walk around the unit.                   Wear clean and comfortable clothing to the hospital.  START STOOL SOFTENERS 2 DAYS PRIOR TO SURGERY. CONTINUE USING AFTER SURGERY.  BRING PHONE NUMBERS FOR YOUR CONTACTS. BRING CELL PHONE AND CHARGER.   IF YOU GET THE MEDICAL ADVANCE DIRECTIVES NOTARIZED, PLEASE BRING WITH YOU.

## 2021-01-18 ENCOUNTER — Telehealth: Payer: Self-pay | Admitting: Urgent Care

## 2021-01-18 DIAGNOSIS — Z01818 Encounter for other preprocedural examination: Secondary | ICD-10-CM

## 2021-01-18 LAB — URINE CULTURE
Culture: >=100000 — AB
Special Requests: NORMAL

## 2021-01-18 NOTE — Progress Notes (Addendum)
  Perioperative Services Pre-Admission/Anesthesia Testing     Date: 01/18/21  Name: Mary [name] MRN:   [id number]  Re: Surgical clearance  Surgical clearance received from Dr. Alice Rieger office (nephrology). Patient has been cleared for the planned elective orthopedic procedure scheduled for 01/29/2021 with Dr. Francesco Sor.  Dr. Wynelle Link noted that patient is optimized for surgery and may proceed with an overall LOW risk stratification.   Additionally, clearance received from endocrinology Tedd Sias, MD) indicating that diabetes management is optimized for surgery at this time.  MD did not provide a restratification; indicated "unable to assess".  Endocrinology requested the patient continue current regimen and the perioperative period.  Copy of signed clearance forms placed on patient's OR chart for review by the surgical/anesthetic team on the day of his procedure.   [name], MSN, APRN, FNP-C, CEN [facility]  Peri-operative Services Nurse Practitioner Phone: [phone] 01/18/21 4:23 PM

## 2021-01-19 LAB — HEMOGLOBIN A1C
Hgb A1c MFr Bld: 7.3 % — ABNORMAL HIGH (ref 4.8–5.6)
Mean Plasma Glucose: 163 mg/dL

## 2021-01-25 ENCOUNTER — Other Ambulatory Visit: Payer: Medicare Other

## 2021-01-26 ENCOUNTER — Other Ambulatory Visit
Admission: RE | Admit: 2021-01-26 | Discharge: 2021-01-26 | Disposition: A | Discharge status: Home / Self Care | Payer: Medicare Other | Source: Ambulatory Visit | Attending: Orthopedic Surgery | Admitting: Orthopedic Surgery

## 2021-01-26 ENCOUNTER — Other Ambulatory Visit: Payer: Self-pay

## 2021-01-26 ENCOUNTER — Encounter: Payer: Self-pay | Admitting: Orthopedic Surgery

## 2021-01-26 DIAGNOSIS — Z20822 Contact with and (suspected) exposure to covid-19: Secondary | ICD-10-CM | POA: Insufficient documentation

## 2021-01-26 DIAGNOSIS — Z01812 Encounter for preprocedural laboratory examination: Secondary | ICD-10-CM | POA: Diagnosis not present

## 2021-01-26 NOTE — H&P (Signed)
ORTHOPAEDIC HISTORY & PHYSICAL Michelene Gardener, [place] - 01/15/2021 10:45 AM EDT Formatting of this note is different from the original. [facility] Chief Complaint:   Chief Complaint  Patient presents with   Knee Pain  H & P RIGHT KNEE   History of Present Illness:   Mary [name] is a 75 y.o. female that presents to clinic today for her preoperative history and evaluation. Patient presents unaccompanied. The patient is scheduled to undergo a right total knee arthroplasty on 01/27/2021 by Dr. Ernest Pine. Her pain began several years ago. The pain is located primarily along the medial aspect of the knee. She describes her pain as constant in nature with intermittent episodes of sharp pain. She reports associated swelling with some giving way of the knee. She denies associated numbness or tingling, denies locking.   The patient's symptoms have progressed to the point that they decrease her quality of life. The patient has previously undergone conservative treatment including NSAIDS and injections to the knee without adequate control of her symptoms.  Denies significant cardiac history, lumbar surgery, or history of blood clots.   Patient is an insulin-dependent diabetic. Last A1c was 7.4. Her daughter will be staying at home with her following surgery.  Past Medical, Surgical, Family, Social History, Allergies, Medications:   Past Medical History:  Past Medical History:  Diagnosis Date   Carpal tunnel syndrome, bilateral   Depression   Diabetic peripheral neuropathy associated with type 1 diabetes mellitus (CMS-HCC)   Diabetic retinal damage of both eyes (CMS-HCC)   DM (diabetes mellitus), type 1 with ophthalmic complications (CMS-HCC)  diabetic retinopathy + macular edema s/p laser surgery   Exotropia of right eye   History of shingles   Hyperlipidemia   Hypertension   Hypothyroidism   Obesity   Osteopenia  Last DEXA 04/03/2019 at [facility]    Ptosis of right eyelid   Redundant colon 03/18/2016   Stage 3b chronic kidney disease (CMS-HCC)   Past Surgical History:  Past Surgical History:  Procedure Laterality Date   CATARACT EXTRACTION Bilateral   CESAREAN SECTION   COLONOSCOPY 03/18/2016  Redundant colon/Otherwise normal Colon/Repeat 28yrs/MUS   Cyst removal surgery  Cyst removal surgery on posterior scalp   Laser surgery Right 01/20/2017  diabetic retinopathy   Left total knee arthroplasty using computer-assited navigation 09/17/2018  Dr Ernest Pine   Current Medications:  Current Outpatient Medications  Medication Sig Dispense Refill   acetaminophen (TYLENOL) 650 MG ER tablet Take 1,300 mg by mouth every 8 (eight) hours as needed   atorvastatin (LIPITOR) 10 MG tablet Take 10 mg by mouth once daily.   carvediloL (COREG) 12.5 MG tablet TAKE 2 TABLETS BY MOUTH TWICE DAILY   cyanocobalamin, vitamin B-12, 2,500 mcg Subl Place 1 tablet under the tongue once daily   diclofenac (VOLTAREN) 1 % topical gel Apply topically   ergocalciferol, vitamin D2, 50,000 unit capsule Take 50,000 Units by mouth once a week   fluconazole (DIFLUCAN) 150 MG tablet Take 150 mg by mouth once daily   FLUoxetine (PROZAC) 20 MG capsule Take 20 mg by mouth once daily. Reported on 11/25/2015   HUMALOG KWIKPEN INSULIN pen injector (concentration 100 units/mL) INJECT UP TO 50 UNITS SUBCUTANEOUSLY DAILY IN DIVIDED DOSES AS DIRECTED 45 mL 1   LANTUS SOLOSTAR U-100 INSULIN pen injector (concentration 100 units/mL) INJECT 32 UNITS SUBCUTANEOUSLY NIGHTLY 15 mL 5   levothyroxine (SYNTHROID) 150 MCG tablet 150 mcg once daily   quinapriL (ACCUPRIL) 20  MG tablet Take 20 mg by mouth once daily   aspirin 81 MG EC tablet Take 81 mg by mouth once daily   FUROsemide (LASIX) 20 MG tablet Take 1 tablet (20 mg total) by mouth once daily 30 tablet 0   lancets (ONETOUCH DELICA LANCETS) Use 4 (four) times daily 400 each 1   pen needle, diabetic (BD ULTRA-FINE SHORT PEN NEEDLE)  31 gauge x 5/16" needle 4 (four) times daily 400 each 0   No current facility-administered medications for this visit.   Allergies:  Allergies  Allergen Reactions   Hydrocodone Itching   Oxycodone Itching and Other (See Comments)   Oxycodone Hcl Itching   Social History:  Social History   Socioeconomic History   Marital status: Divorced   Number of children: 2   Years of education: 14+  Occupational History   Occupation: Retired  Tobacco Use   Smoking status: Never Smoker   Smokeless tobacco: Never Used  Building services engineer Use: Never used  Substance and Sexual Activity   Alcohol use: Yes  Alcohol/week: 0.0 standard drinks  Comment: occasional wine   Drug use: No   Sexual activity: Not Currently   Family History:  Family History  Problem Relation Age of Onset   Lupus Mother   Diabetes type I Father   Heart disease Father   Myocardial Infarction (Heart attack) Father  COD   Diabetes type I Sister   Diabetes type I Daughter   Hypothyroidism Other   Pheochromocytoma (Hormone producing cancer) Maternal Grandfather   No Known Problems Maternal Grandmother   No Known Problems Paternal Grandmother   Myocardial Infarction (Heart attack) Paternal Grandfather  COD   Diabetes Sister   High blood pressure (Hypertension) Sister   No Known Problems Son   Colon cancer Neg Hx   Colon polyps Neg Hx   Rectal cancer Neg Hx   Liver disease Neg Hx   Review of Systems:   A 10+ ROS was performed, reviewed, and the pertinent orthopaedic findings are documented in the HPI.   Physical Examination:   BP 132/80 (BP Location: Left upper arm, Patient Position: Sitting, BP Cuff Size: Large Adult)  Ht 162.6 cm (5\' 4" )  Wt 97.3 kg (214 lb 9.6 oz)  BMI 36.84 kg/m   Patient is a well-developed, well-nourished female in no acute distress. Patient has normal mood and affect. Patient is alert and oriented to person, place, and time.   HEENT: Atraumatic, normocephalic. Pupils equal and  reactive to light. Extraocular motion intact. Noninjected sclera.  Cardiovascular: Regular rate and rhythm, with no murmurs, rubs, or gallops. Distal pulses palpable. No bruits.  Respiratory: Lungs clear to auscultation bilaterally.   Right Knee: Soft tissue swelling: mild Effusion: minimal Erythema: none Crepitance: mild Tenderness: medial Alignment: relative varus Mediolateral laxity: medial pseudolaxity Posterior sag: negative Patellar tracking: Good tracking without evidence of subluxation or tilt Atrophy: No significant atrophy.  Quadriceps tone was fair to good. Range of motion: 0/9/115 degrees  Sensation intact over the saphenous, lateral sural cutaneous, superficial fibular, and deep fibular nerve distributions.  Tests Performed/Reviewed:  X-rays  X-ray knee right 3 views  Result Date: 01/15/2021 3 views of the right knee were obtained. Images reveal severe loss of medial compartment joint space with significant osteophyte formation. No fractures or dislocations. No osseous abnormality noted.   Impression:   ICD-10-CM  1. Primary osteoarthritis of right knee M17.11   Plan:   The patient has end-stage degenerative changes of the right  knee. It was explained to the patient that the condition is progressive in nature. Having failed conservative treatment, the patient has elected to proceed with a total joint arthroplasty. The patient will undergo a total joint arthroplasty with Dr. Ernest Pine. The risks of surgery, including blood clot and infection, were discussed with the patient. Measures to reduce these risks, including the use of anticoagulation, perioperative antibiotics, and early ambulation were discussed. The importance of postoperative physical therapy was discussed with the patient. The patient elects to proceed with surgery. The patient is instructed to stop all blood thinners prior to surgery. The patient is instructed to call the hospital the day before surgery to  learn of the proper arrival time.   Contact our office with any questions or concerns. Follow up as indicated, or sooner should any new problems arise, if conditions worsen, or if they are otherwise concerned.   Michelene Gardener, PA -C [facility] [address] [place], [place] [address] Phone: [phone]  This note was generated in part with voice recognition software and I apologize for any typographical errors that were not detected and corrected.  Electronically signed by Michelene Gardener, PA at 01/15/2021 8:08 PM EDT

## 2021-01-27 ENCOUNTER — Ambulatory Visit: Payer: Medicare Other | Admitting: Anesthesiology

## 2021-01-27 ENCOUNTER — Encounter: Payer: Self-pay | Admitting: Orthopedic Surgery

## 2021-01-27 ENCOUNTER — Other Ambulatory Visit: Payer: Self-pay

## 2021-01-27 ENCOUNTER — Encounter
Admission: RE | Disposition: A | Discharge status: Home / Self Care | Payer: Self-pay | Source: Home / Self Care | Attending: Orthopedic Surgery

## 2021-01-27 ENCOUNTER — Observation Stay
Admission: RE | Admit: 2021-01-27 | Discharge: 2021-01-29 | Disposition: A | Discharge status: Home / Self Care | Payer: Medicare Other | Attending: Orthopedic Surgery | Admitting: Orthopedic Surgery

## 2021-01-27 ENCOUNTER — Observation Stay: Payer: Medicare Other

## 2021-01-27 DIAGNOSIS — M1711 Unilateral primary osteoarthritis, right knee: Secondary | ICD-10-CM | POA: Diagnosis not present

## 2021-01-27 DIAGNOSIS — Z96659 Presence of unspecified artificial knee joint: Secondary | ICD-10-CM

## 2021-01-27 DIAGNOSIS — E039 Hypothyroidism, unspecified: Secondary | ICD-10-CM | POA: Insufficient documentation

## 2021-01-27 DIAGNOSIS — M25561 Pain in right knee: Secondary | ICD-10-CM | POA: Diagnosis present

## 2021-01-27 DIAGNOSIS — Z471 Aftercare following joint replacement surgery: Secondary | ICD-10-CM | POA: Diagnosis not present

## 2021-01-27 DIAGNOSIS — N1832 Chronic kidney disease, stage 3b: Secondary | ICD-10-CM | POA: Insufficient documentation

## 2021-01-27 DIAGNOSIS — Z7982 Long term (current) use of aspirin: Secondary | ICD-10-CM | POA: Diagnosis not present

## 2021-01-27 DIAGNOSIS — Z794 Long term (current) use of insulin: Secondary | ICD-10-CM | POA: Insufficient documentation

## 2021-01-27 DIAGNOSIS — E1022 Type 1 diabetes mellitus with diabetic chronic kidney disease: Secondary | ICD-10-CM | POA: Diagnosis not present

## 2021-01-27 DIAGNOSIS — Z79899 Other long term (current) drug therapy: Secondary | ICD-10-CM | POA: Insufficient documentation

## 2021-01-27 DIAGNOSIS — I129 Hypertensive chronic kidney disease with stage 1 through stage 4 chronic kidney disease, or unspecified chronic kidney disease: Secondary | ICD-10-CM | POA: Insufficient documentation

## 2021-01-27 DIAGNOSIS — Z96651 Presence of right artificial knee joint: Secondary | ICD-10-CM | POA: Diagnosis not present

## 2021-01-27 DIAGNOSIS — Z96652 Presence of left artificial knee joint: Secondary | ICD-10-CM | POA: Insufficient documentation

## 2021-01-27 DIAGNOSIS — I739 Peripheral vascular disease, unspecified: Secondary | ICD-10-CM | POA: Diagnosis not present

## 2021-01-27 HISTORY — PX: KNEE ARTHROPLASTY: SHX992

## 2021-01-27 LAB — GLUCOSE, CAPILLARY
Glucose-Capillary: 178 mg/dL — ABNORMAL HIGH (ref 70–99)
Glucose-Capillary: 236 mg/dL — ABNORMAL HIGH (ref 70–99)
Glucose-Capillary: 222 mg/dL — ABNORMAL HIGH (ref 70–99)
Glucose-Capillary: 238 mg/dL — ABNORMAL HIGH (ref 70–99)
Glucose-Capillary: 379 mg/dL — ABNORMAL HIGH (ref 70–99)

## 2021-01-27 LAB — SARS CORONAVIRUS 2 (TAT 6-24 HRS): SARS Coronavirus 2: NEGATIVE

## 2021-01-27 SURGERY — ARTHROPLASTY, KNEE, TOTAL, USING IMAGELESS COMPUTER-ASSISTED NAVIGATION
Anesthesia: Spinal | Site: Knee | Laterality: Right

## 2021-01-27 MED ORDER — ORAL CARE MOUTH RINSE: 15.0000 mL | Freq: Once | OROMUCOSAL | Status: AC

## 2021-01-27 MED ORDER — ATORVASTATIN CALCIUM 10 MG PO TABS
10.0000 mg | ORAL_TABLET | Freq: Every day | ORAL | Status: DC
  Administered 2021-01-27 – 2021-01-28 (×2): 10 mg via ORAL
  Filled 2021-01-27 (×3): qty 1

## 2021-01-27 MED ORDER — GABAPENTIN 300 MG PO CAPS
300.0000 mg | ORAL_CAPSULE | Freq: Once | ORAL | Status: AC
  Administered 2021-01-27: 300 mg via ORAL

## 2021-01-27 MED ORDER — VITAMIN B-12 1000 MCG PO TABS
1000.0000 ug | ORAL_TABLET | Freq: Every day | ORAL | Status: DC
  Administered 2021-01-28 – 2021-01-29 (×2): 1000 ug via ORAL
  Filled 2021-01-27 (×2): qty 1

## 2021-01-27 MED ORDER — PROPOFOL 500 MG/50ML IV EMUL
INTRAVENOUS | Status: DC | PRN
  Administered 2021-01-27: 200 ug/kg/min via INTRAVENOUS

## 2021-01-27 MED ORDER — DIPHENHYDRAMINE HCL 12.5 MG/5ML PO ELIX: 12.5000 mg | ORAL_SOLUTION | ORAL | Status: DC | PRN

## 2021-01-27 MED ORDER — FLUOXETINE HCL 20 MG PO CAPS
20.0000 mg | ORAL_CAPSULE | Freq: Every day | ORAL | Status: DC
  Administered 2021-01-28 – 2021-01-29 (×2): 20 mg via ORAL
  Filled 2021-01-27 (×2): qty 1

## 2021-01-27 MED ORDER — TRANEXAMIC ACID-NACL 1000-0.7 MG/100ML-% IV SOLN
INTRAVENOUS | Status: AC
  Filled 2021-01-27: qty 100

## 2021-01-27 MED ORDER — TRANEXAMIC ACID-NACL 1000-0.7 MG/100ML-% IV SOLN
INTRAVENOUS | Status: AC
  Administered 2021-01-27: 1000 mg via INTRAVENOUS
  Filled 2021-01-27: qty 100

## 2021-01-27 MED ORDER — PANTOPRAZOLE SODIUM 40 MG PO TBEC
40.0000 mg | DELAYED_RELEASE_TABLET | Freq: Two times a day (BID) | ORAL | Status: DC
  Administered 2021-01-27 – 2021-01-29 (×4): 40 mg via ORAL
  Filled 2021-01-27 (×4): qty 1

## 2021-01-27 MED ORDER — SODIUM CHLORIDE 0.9 % IV SOLN: INTRAVENOUS | Status: DC

## 2021-01-27 MED ORDER — DEXAMETHASONE SODIUM PHOSPHATE 10 MG/ML IJ SOLN
INTRAMUSCULAR | Status: AC
  Filled 2021-01-27: qty 1

## 2021-01-27 MED ORDER — CHLORHEXIDINE GLUCONATE 0.12 % MT SOLN
OROMUCOSAL | Status: AC
  Filled 2021-01-27: qty 15

## 2021-01-27 MED ORDER — SODIUM CHLORIDE 0.9 % IR SOLN
Status: DC | PRN
  Administered 2021-01-27: 500 mL

## 2021-01-27 MED ORDER — INSULIN GLARGINE 100 UNIT/ML ~~LOC~~ SOLN
32.0000 [IU] | Freq: Every day | SUBCUTANEOUS | Status: DC
  Administered 2021-01-27 – 2021-01-29 (×3): 32 [IU] via SUBCUTANEOUS
  Filled 2021-01-27 (×4): qty 0.32

## 2021-01-27 MED ORDER — CELECOXIB 200 MG PO CAPS
200.0000 mg | ORAL_CAPSULE | Freq: Two times a day (BID) | ORAL | Status: DC
  Administered 2021-01-27 – 2021-01-29 (×4): 200 mg via ORAL
  Filled 2021-01-27 (×4): qty 1

## 2021-01-27 MED ORDER — BUPIVACAINE HCL (PF) 0.25 % IJ SOLN
INTRAMUSCULAR | Status: DC | PRN
  Administered 2021-01-27: 60 mL

## 2021-01-27 MED ORDER — FERROUS SULFATE 325 (65 FE) MG PO TABS
325.0000 mg | ORAL_TABLET | Freq: Two times a day (BID) | ORAL | Status: DC
  Administered 2021-01-27 – 2021-01-29 (×5): 325 mg via ORAL
  Filled 2021-01-27 (×5): qty 1

## 2021-01-27 MED ORDER — FENTANYL CITRATE (PF) 100 MCG/2ML IJ SOLN: 25.0000 ug | INTRAMUSCULAR | Status: DC | PRN

## 2021-01-27 MED ORDER — QUINAPRIL HCL 10 MG PO TABS
20.0000 mg | ORAL_TABLET | Freq: Every day | ORAL | Status: DC
  Administered 2021-01-28 – 2021-01-29 (×2): 20 mg via ORAL
  Filled 2021-01-27 (×2): qty 2

## 2021-01-27 MED ORDER — POVIDONE-IODINE 10 % EX SOLN
CUTANEOUS | Status: DC | PRN
  Administered 2021-01-27: 1 via TOPICAL

## 2021-01-27 MED ORDER — SURGIPHOR WOUND IRRIGATION SYSTEM - OPTIME
TOPICAL | Status: DC | PRN
  Administered 2021-01-27: 1

## 2021-01-27 MED ORDER — BUPIVACAINE HCL (PF) 0.25 % IJ SOLN
INTRAMUSCULAR | Status: AC
  Filled 2021-01-27: qty 60

## 2021-01-27 MED ORDER — TRAMADOL HCL 50 MG PO TABS: 50.0000 mg | ORAL_TABLET | ORAL | Status: DC | PRN

## 2021-01-27 MED ORDER — PHENOL 1.4 % MT LIQD
1.0000 | OROMUCOSAL | Status: DC | PRN
  Filled 2021-01-27: qty 177

## 2021-01-27 MED ORDER — METOCLOPRAMIDE HCL 10 MG PO TABS
10.0000 mg | ORAL_TABLET | Freq: Three times a day (TID) | ORAL | Status: AC
  Administered 2021-01-27 – 2021-01-29 (×8): 10 mg via ORAL
  Filled 2021-01-27 (×8): qty 1

## 2021-01-27 MED ORDER — FAMOTIDINE 20 MG PO TABS
ORAL_TABLET | ORAL | Status: AC
  Filled 2021-01-27: qty 1

## 2021-01-27 MED ORDER — FENTANYL CITRATE (PF) 100 MCG/2ML IJ SOLN
INTRAMUSCULAR | Status: AC
  Administered 2021-01-27: 25 ug via INTRAVENOUS
  Filled 2021-01-27: qty 2

## 2021-01-27 MED ORDER — SODIUM CHLORIDE 0.9 % IV SOLN
INTRAVENOUS | Status: DC | PRN
  Administered 2021-01-27: 60 mL

## 2021-01-27 MED ORDER — PROPOFOL 1000 MG/100ML IV EMUL
INTRAVENOUS | Status: AC
  Filled 2021-01-27: qty 100

## 2021-01-27 MED ORDER — FAMOTIDINE 20 MG PO TABS
20.0000 mg | ORAL_TABLET | Freq: Once | ORAL | Status: AC
  Administered 2021-01-27: 20 mg via ORAL

## 2021-01-27 MED ORDER — FLEET ENEMA 7-19 GM/118ML RE ENEM: 1.0000 | ENEMA | Freq: Once | RECTAL | Status: DC | PRN

## 2021-01-27 MED ORDER — BUPIVACAINE LIPOSOME 1.3 % IJ SUSP
INTRAMUSCULAR | Status: AC
  Filled 2021-01-27: qty 20

## 2021-01-27 MED ORDER — SODIUM CHLORIDE FLUSH 0.9 % IV SOLN
INTRAVENOUS | Status: AC
  Filled 2021-01-27: qty 40

## 2021-01-27 MED ORDER — INSULIN ASPART 100 UNIT/ML IJ SOLN
0.0000 [IU] | Freq: Every day | INTRAMUSCULAR | Status: DC
  Administered 2021-01-27: 5 [IU] via SUBCUTANEOUS
  Filled 2021-01-27: qty 1

## 2021-01-27 MED ORDER — ACETAMINOPHEN 10 MG/ML IV SOLN
1000.0000 mg | Freq: Four times a day (QID) | INTRAVENOUS | Status: AC
  Administered 2021-01-27 – 2021-01-28 (×4): 1000 mg via INTRAVENOUS
  Filled 2021-01-27 (×4): qty 100

## 2021-01-27 MED ORDER — SENNOSIDES-DOCUSATE SODIUM 8.6-50 MG PO TABS
1.0000 | ORAL_TABLET | Freq: Two times a day (BID) | ORAL | Status: DC
  Administered 2021-01-27 – 2021-01-29 (×4): 1 via ORAL
  Filled 2021-01-27 (×4): qty 1

## 2021-01-27 MED ORDER — INSULIN ASPART 100 UNIT/ML IJ SOLN
0.0000 [IU] | Freq: Three times a day (TID) | INTRAMUSCULAR | Status: DC
  Administered 2021-01-27: 5 [IU] via SUBCUTANEOUS
  Administered 2021-01-28: 15 [IU] via SUBCUTANEOUS
  Administered 2021-01-28: 8 [IU] via SUBCUTANEOUS
  Administered 2021-01-28: 5 [IU] via SUBCUTANEOUS
  Administered 2021-01-29 (×2): 3 [IU] via SUBCUTANEOUS
  Administered 2021-01-29: 5 [IU] via SUBCUTANEOUS
  Filled 2021-01-27 (×6): qty 1

## 2021-01-27 MED ORDER — LEVOTHYROXINE SODIUM 50 MCG PO TABS
150.0000 ug | ORAL_TABLET | Freq: Every day | ORAL | Status: DC
  Administered 2021-01-28 – 2021-01-29 (×2): 150 ug via ORAL
  Filled 2021-01-27 (×2): qty 3

## 2021-01-27 MED ORDER — SODIUM CHLORIDE 0.9 % IV SOLN
INTRAVENOUS | Status: DC | PRN
  Administered 2021-01-27: 50 ug/min via INTRAVENOUS

## 2021-01-27 MED ORDER — GABAPENTIN 300 MG PO CAPS
ORAL_CAPSULE | ORAL | Status: AC
  Filled 2021-01-27: qty 1

## 2021-01-27 MED ORDER — BUPIVACAINE HCL (PF) 0.5 % IJ SOLN
INTRAMUSCULAR | Status: DC | PRN
  Administered 2021-01-27: 3 mL

## 2021-01-27 MED ORDER — CEFAZOLIN SODIUM-DEXTROSE 2-4 GM/100ML-% IV SOLN
2.0000 g | Freq: Four times a day (QID) | INTRAVENOUS | Status: AC
  Administered 2021-01-27 (×2): 2 g via INTRAVENOUS
  Filled 2021-01-27 (×2): qty 100

## 2021-01-27 MED ORDER — CHLORHEXIDINE GLUCONATE 4 % EX LIQD: 60.0000 mL | Freq: Once | CUTANEOUS | Status: DC

## 2021-01-27 MED ORDER — MENTHOL 3 MG MT LOZG
1.0000 | LOZENGE | OROMUCOSAL | Status: DC | PRN
  Filled 2021-01-27: qty 9

## 2021-01-27 MED ORDER — ACETAMINOPHEN 10 MG/ML IV SOLN
INTRAVENOUS | Status: AC
  Filled 2021-01-27: qty 100

## 2021-01-27 MED ORDER — ACETAMINOPHEN 10 MG/ML IV SOLN
INTRAVENOUS | Status: DC | PRN
  Administered 2021-01-27: 1000 mg via INTRAVENOUS

## 2021-01-27 MED ORDER — ALUM & MAG HYDROXIDE-SIMETH 200-200-20 MG/5ML PO SUSP: 30.0000 mL | ORAL | Status: DC | PRN

## 2021-01-27 MED ORDER — TRANEXAMIC ACID-NACL 1000-0.7 MG/100ML-% IV SOLN
1000.0000 mg | INTRAVENOUS | Status: AC
  Administered 2021-01-27: 1000 mg via INTRAVENOUS

## 2021-01-27 MED ORDER — BISACODYL 10 MG RE SUPP
10.0000 mg | Freq: Every day | RECTAL | Status: DC | PRN
  Filled 2021-01-27: qty 1

## 2021-01-27 MED ORDER — MIDAZOLAM HCL 2 MG/2ML IJ SOLN
INTRAMUSCULAR | Status: AC
  Filled 2021-01-27: qty 2

## 2021-01-27 MED ORDER — MIDAZOLAM HCL 5 MG/5ML IJ SOLN
INTRAMUSCULAR | Status: DC | PRN
  Administered 2021-01-27: 2 mg via INTRAVENOUS

## 2021-01-27 MED ORDER — ONDANSETRON HCL 4 MG/2ML IJ SOLN: 4.0000 mg | Freq: Four times a day (QID) | INTRAMUSCULAR | Status: DC | PRN

## 2021-01-27 MED ORDER — ACETAMINOPHEN 325 MG PO TABS
325.0000 mg | ORAL_TABLET | Freq: Four times a day (QID) | ORAL | Status: DC | PRN
  Administered 2021-01-29: 650 mg via ORAL
  Filled 2021-01-27: qty 2

## 2021-01-27 MED ORDER — TRANEXAMIC ACID-NACL 1000-0.7 MG/100ML-% IV SOLN: 1000.0000 mg | Freq: Once | INTRAVENOUS | Status: AC

## 2021-01-27 MED ORDER — NEOMYCIN-POLYMYXIN B GU 40-200000 IR SOLN
Status: AC
  Filled 2021-01-27: qty 2

## 2021-01-27 MED ORDER — OXYCODONE HCL 5 MG PO TABS
5.0000 mg | ORAL_TABLET | ORAL | Status: DC | PRN
  Administered 2021-01-28 – 2021-01-29 (×3): 5 mg via ORAL
  Filled 2021-01-27 (×3): qty 1

## 2021-01-27 MED ORDER — HYDROMORPHONE HCL 1 MG/ML IJ SOLN: 0.5000 mg | INTRAMUSCULAR | Status: DC | PRN

## 2021-01-27 MED ORDER — INSULIN ASPART 100 UNIT/ML IJ SOLN
INTRAMUSCULAR | Status: AC
  Administered 2021-01-27: 5 [IU] via SUBCUTANEOUS
  Filled 2021-01-27: qty 1

## 2021-01-27 MED ORDER — MAGNESIUM HYDROXIDE 400 MG/5ML PO SUSP
30.0000 mL | Freq: Every day | ORAL | Status: DC
  Administered 2021-01-28: 30 mL via ORAL
  Filled 2021-01-27: qty 30

## 2021-01-27 MED ORDER — OXYCODONE HCL 5 MG PO TABS
10.0000 mg | ORAL_TABLET | ORAL | Status: DC | PRN
  Administered 2021-01-27 – 2021-01-28 (×3): 10 mg via ORAL
  Filled 2021-01-27 (×3): qty 2

## 2021-01-27 MED ORDER — FUROSEMIDE 20 MG PO TABS
20.0000 mg | ORAL_TABLET | Freq: Every day | ORAL | Status: DC
  Administered 2021-01-28 – 2021-01-29 (×2): 20 mg via ORAL
  Filled 2021-01-27 (×2): qty 1

## 2021-01-27 MED ORDER — CEFAZOLIN SODIUM-DEXTROSE 2-4 GM/100ML-% IV SOLN
2.0000 g | INTRAVENOUS | Status: AC
  Administered 2021-01-27: 2 g via INTRAVENOUS

## 2021-01-27 MED ORDER — ENOXAPARIN SODIUM 30 MG/0.3ML IJ SOSY
30.0000 mg | PREFILLED_SYRINGE | Freq: Two times a day (BID) | INTRAMUSCULAR | Status: DC
  Administered 2021-01-28 – 2021-01-29 (×3): 30 mg via SUBCUTANEOUS
  Filled 2021-01-27 (×3): qty 0.3

## 2021-01-27 MED ORDER — ONDANSETRON HCL 4 MG PO TABS: 4.0000 mg | ORAL_TABLET | Freq: Four times a day (QID) | ORAL | Status: DC | PRN

## 2021-01-27 MED ORDER — CHLORHEXIDINE GLUCONATE 0.12 % MT SOLN
15.0000 mL | Freq: Once | OROMUCOSAL | Status: AC
  Administered 2021-01-27: 15 mL via OROMUCOSAL

## 2021-01-27 MED ORDER — CARVEDILOL 25 MG PO TABS
25.0000 mg | ORAL_TABLET | Freq: Two times a day (BID) | ORAL | Status: DC
  Administered 2021-01-27 – 2021-01-29 (×4): 25 mg via ORAL
  Filled 2021-01-27 (×4): qty 1

## 2021-01-27 MED ORDER — CEFAZOLIN SODIUM-DEXTROSE 2-4 GM/100ML-% IV SOLN
INTRAVENOUS | Status: AC
  Filled 2021-01-27: qty 100

## 2021-01-27 MED ORDER — DEXAMETHASONE SODIUM PHOSPHATE 10 MG/ML IJ SOLN
8.0000 mg | Freq: Once | INTRAMUSCULAR | Status: AC
  Administered 2021-01-27: 8 mg via INTRAVENOUS

## 2021-01-27 SURGICAL SUPPLY — 77 items
ATTUNE MED DOME PAT 38 KNEE (Knees) ×1 IMPLANT
ATTUNE PSFEM RTSZ5 NARCEM KNEE (Femur) ×1 IMPLANT
ATTUNE PSRP INSR SZ 5 10M KNEE (Insert) ×1 IMPLANT
BASE TIBIAL ROT PLAT SZ 3 KNEE (Knees) IMPLANT
BATTERY INSTRU NAVIGATION (MISCELLANEOUS) ×8 IMPLANT
BLADE SAW 70X12.5 (BLADE) ×2 IMPLANT
BLADE SAW 90X13X1.19 OSCILLAT (BLADE) ×2 IMPLANT
BLADE SAW 90X25X1.19 OSCILLAT (BLADE) ×2 IMPLANT
BONE CEMENT GENTAMICIN (Cement) ×4 IMPLANT
CEMENT BONE GENTAMICIN 40 (Cement) IMPLANT
COOLER POLAR GLACIER W/PUMP (MISCELLANEOUS) ×2 IMPLANT
CUFF TOURN SGL QUICK 34 (TOURNIQUET CUFF) ×1
CUFF TRNQT CYL 34X4.125X (TOURNIQUET CUFF) IMPLANT
DRAPE 3/4 80X56 (DRAPES) ×2 IMPLANT
DRSG DERMACEA 8X12 NADH (GAUZE/BANDAGES/DRESSINGS) ×2 IMPLANT
DRSG MEPILEX SACRM 8.7X9.8 (GAUZE/BANDAGES/DRESSINGS) ×2 IMPLANT
DRSG OPSITE POSTOP 4X12 (GAUZE/BANDAGES/DRESSINGS) ×1 IMPLANT
DRSG OPSITE POSTOP 4X14 (GAUZE/BANDAGES/DRESSINGS) ×2 IMPLANT
DRSG TEGADERM 4X4.75 (GAUZE/BANDAGES/DRESSINGS) ×2 IMPLANT
DURAPREP 26ML APPLICATOR (WOUND CARE) ×4 IMPLANT
ELECT CAUTERY BLADE 6.4 (BLADE) ×2 IMPLANT
ELECT REM PT RETURN 9FT ADLT (ELECTROSURGICAL) ×2
ELECTRODE REM PT RTRN 9FT ADLT (ELECTROSURGICAL) ×1 IMPLANT
EX-PIN ORTHOLOCK NAV 4X150 (PIN) ×4 IMPLANT
GAUZE 4X4 16PLY ~~LOC~~+RFID DBL (SPONGE) ×2 IMPLANT
GLOVE SURG ENC MOIS LTX SZ7.5 (GLOVE) ×4 IMPLANT
GLOVE SURG ENC TEXT LTX SZ7.5 (GLOVE) ×4 IMPLANT
GLOVE SURG UNDER LTX SZ8 (GLOVE) ×2 IMPLANT
GLOVE SURG UNDER POLY LF SZ7.5 (GLOVE) ×2 IMPLANT
GOWN STRL REUS W/ TWL LRG LVL3 (GOWN DISPOSABLE) ×2 IMPLANT
GOWN STRL REUS W/ TWL XL LVL3 (GOWN DISPOSABLE) ×1 IMPLANT
GOWN STRL REUS W/TWL LRG LVL3 (GOWN DISPOSABLE) ×2
GOWN STRL REUS W/TWL XL LVL3 (GOWN DISPOSABLE) ×1
HEMOVAC 400CC 10FR (MISCELLANEOUS) ×2 IMPLANT
HOLDER FOLEY CATH W/STRAP (MISCELLANEOUS) ×2 IMPLANT
HOOD PEEL AWAY FLYTE STAYCOOL (MISCELLANEOUS) ×4 IMPLANT
IRRIGATION SURGIPHOR STRL (IV SOLUTION) ×2 IMPLANT
IV NS IRRIG 3000ML ARTHROMATIC (IV SOLUTION) ×2 IMPLANT
KIT TURNOVER KIT A (KITS) ×2 IMPLANT
KNIFE SCULPS 14X20 (INSTRUMENTS) ×2 IMPLANT
LABEL OR SOLS (LABEL) ×2 IMPLANT
MANIFOLD NEPTUNE II (INSTRUMENTS) ×4 IMPLANT
NDL SAFETY ECLIPSE 18X1.5 (NEEDLE) ×1 IMPLANT
NDL SPNL 20GX3.5 QUINCKE YW (NEEDLE) ×2 IMPLANT
NEEDLE HYPO 18GX1.5 SHARP (NEEDLE) ×1
NEEDLE SPNL 20GX3.5 QUINCKE YW (NEEDLE) ×4 IMPLANT
NS IRRIG 500ML POUR BTL (IV SOLUTION) ×2 IMPLANT
PACK TOTAL KNEE (MISCELLANEOUS) ×2 IMPLANT
PAD ABD DERMACEA PRESS 5X9 (GAUZE/BANDAGES/DRESSINGS) ×1 IMPLANT
PAD CAST CTTN 4X4 STRL (SOFTGOODS) IMPLANT
PAD WRAPON POLAR KNEE (MISCELLANEOUS) ×1 IMPLANT
PADDING CAST COTTON 4X4 STRL (SOFTGOODS) ×1
PENCIL SMOKE EVACUATOR COATED (MISCELLANEOUS) ×2 IMPLANT
PIN FIXATION 1/8DIA X 3INL (PIN) ×6 IMPLANT
PULSAVAC PLUS IRRIG FAN TIP (DISPOSABLE) ×2
SOL PREP PVP 2OZ (MISCELLANEOUS) ×2
SOLUTION PREP PVP 2OZ (MISCELLANEOUS) ×1 IMPLANT
SPONGE DRAIN TRACH 4X4 STRL 2S (GAUZE/BANDAGES/DRESSINGS) ×2 IMPLANT
SPONGE T-LAP 18X18 ~~LOC~~+RFID (SPONGE) ×7 IMPLANT
STAPLER SKIN PROX 35W (STAPLE) ×2 IMPLANT
STOCKINETTE BIAS CUT 6 980064 (GAUZE/BANDAGES/DRESSINGS) ×1 IMPLANT
STOCKINETTE IMPERV 14X48 (MISCELLANEOUS) ×1 IMPLANT
STRAP TIBIA SHORT (MISCELLANEOUS) ×2 IMPLANT
SUCTION FRAZIER HANDLE 10FR (MISCELLANEOUS) ×1
SUCTION TUBE FRAZIER 10FR DISP (MISCELLANEOUS) ×1 IMPLANT
SUT VIC AB 0 CT1 36 (SUTURE) ×4 IMPLANT
SUT VIC AB 1 CT1 36 (SUTURE) ×4 IMPLANT
SUT VIC AB 2-0 CT2 27 (SUTURE) ×2 IMPLANT
SYR 20ML LL LF (SYRINGE) ×2 IMPLANT
SYR 30ML LL (SYRINGE) ×4 IMPLANT
TAPE PAPER 2X10 WHT MICROPORE (GAUZE/BANDAGES/DRESSINGS) ×1 IMPLANT
TIBIAL BASE ROT PLAT SZ 3 KNEE (Knees) ×2 IMPLANT
TIP FAN IRRIG PULSAVAC PLUS (DISPOSABLE) ×1 IMPLANT
TOWEL OR 17X26 4PK STRL BLUE (TOWEL DISPOSABLE) ×2 IMPLANT
TOWER CARTRIDGE SMART MIX (DISPOSABLE) ×2 IMPLANT
TRAY FOLEY MTR SLVR 16FR STAT (SET/KITS/TRAYS/PACK) ×2 IMPLANT
WRAPON POLAR PAD KNEE (MISCELLANEOUS) ×2

## 2021-01-27 NOTE — Evaluation (Signed)
Physical Therapy Evaluation Patient Details Name: Mary [name] MRN: [id number] DOB: [birth year] Today's Date: 01/27/2021   History of Present Illness  Pt admitted for R TKR and is POD 0 at time of evaluation.  Clinical Impression  Pt is a pleasant 75 year old female who was admitted for R TKR. Pt performs bed mobility, transfers, and ambulation with cga and RW. Pt demonstrates ability to perform 10 SLRs with independence, therefore does not require KI for mobility. Pt demonstrates deficits with strength/pain/mobility. Would benefit from skilled PT to address above deficits and promote optimal return to PLOF. Recommend transition to HHPT upon discharge from acute hospitalization.     Follow Up Recommendations Home health PT    Equipment Recommendations  None recommended by PT    Recommendations for Other Services       Precautions / Restrictions Precautions Precautions: Fall;Knee Precaution Booklet Issued: No Restrictions Weight Bearing Restrictions: Yes RLE Weight Bearing: Weight bearing as tolerated      Mobility  Bed Mobility Overal bed mobility: Needs Assistance Bed Mobility: Supine to Sit     Supine to sit: Min guard     General bed mobility comments: safe technique with slow transition to EOB. Once seated, upright posture noted    Transfers Overall transfer level: Needs assistance Equipment used: Rolling walker (2 wheeled) Transfers: Sit to/from Stand Sit to Stand: Min guard         General transfer comment: pt fearful of mobility. Once standing, slightly dizzy, improves with time. No nausea  Ambulation/Gait Ambulation/Gait assistance: Min guard Gait Distance (Feet): 5 Feet Assistive device: Rolling walker (2 wheeled) Gait Pattern/deviations: Step-to pattern     General Gait Details: ambulated with step to gait pattern over to recliner. RW used  Information systems manager Rankin (Stroke Patients Only)        Balance Overall balance assessment: Needs assistance Sitting-balance support: Feet supported Sitting balance-Leahy Scale: Good     Standing balance support: Bilateral upper extremity supported Standing balance-Leahy Scale: Good                               Pertinent Vitals/Pain Pain Assessment: 0-10 Pain Score: 7  Pain Location: R knee Pain Descriptors / Indicators: Operative site guarding Pain Intervention(s): Limited activity within patient's tolerance;Ice applied;Premedicated before session    Home Living Family/patient expects to be discharged to:: Private residence Living Arrangements: Children;Other relatives Available Help at Discharge: Family;Available 24 hours/day Type of Home: House Home Access: Stairs to enter Entrance Stairs-Rails: Left Entrance Stairs-Number of Steps: 3 Home Layout: One level Home Equipment: Walker - 2 wheels;Bedside commode;Cane - single point      Prior Function Level of Independence: Independent with assistive device(s)         Comments: uses SPC at baseline     Hand Dominance        Extremity/Trunk Assessment   Upper Extremity Assessment Upper Extremity Assessment: Overall WFL for tasks assessed    Lower Extremity Assessment Lower Extremity Assessment: Generalized weakness (R LE grossly 3/5; L LE grossly 4/5)       Communication   Communication: No difficulties  Cognition Arousal/Alertness: Awake/alert Behavior During Therapy: WFL for tasks assessed/performed Overall Cognitive Status: Within Functional Limits for tasks assessed  General Comments      Exercises Total Joint Exercises Goniometric ROM: R knee AAROM: 0-68 degrees Other Exercises Other Exercises: supine ther-ex performed on R LE including AP, quad sets, SLRs, and hip abd/add. 10 reps with min assist.   Assessment/Plan    PT Assessment Patient needs continued PT services  PT  Problem List Decreased strength;Decreased range of motion;Decreased balance;Decreased mobility;Pain;Decreased knowledge of use of DME       PT Treatment Interventions DME instruction;Gait training;Therapeutic exercise;Balance training    PT Goals (Current goals can be found in the Care Plan section)  Acute Rehab PT Goals Patient Stated Goal: to go home PT Goal Formulation: With patient Time For Goal Achievement: 02/10/21 Potential to Achieve Goals: Good    Frequency BID   Barriers to discharge        Co-evaluation               AM-PAC PT "6 Clicks" Mobility  Outcome Measure Help needed turning from your back to your side while in a flat bed without using bedrails?: A Little Help needed moving from lying on your back to sitting on the side of a flat bed without using bedrails?: A Little Help needed moving to and from a bed to a chair (including a wheelchair)?: A Little Help needed standing up from a chair using your arms (e.g., wheelchair or bedside chair)?: A Little Help needed to walk in hospital room?: A Little Help needed climbing 3-5 steps with a railing? : A Lot 6 Click Score: 17    End of Session Equipment Utilized During Treatment: Gait belt Activity Tolerance: Patient tolerated treatment well Patient left: in chair;with chair alarm set;with SCD's reapplied Nurse Communication: Mobility status PT Visit Diagnosis: Difficulty in walking, not elsewhere classified (R26.2);Pain;Muscle weakness (generalized) (M62.81) Pain - Right/Left: Right Pain - part of body: Knee    Time: 5638-9373 PT Time Calculation (min) (ACUTE ONLY): 34 min   Charges:   PT Evaluation $PT Eval Low Complexity: 1 Low PT Treatments $Therapeutic Exercise: 8-22 mins        Elizabeth Palau, PT, DPT [phone]   Mary [name] 01/27/2021, 5:16 PM

## 2021-01-27 NOTE — Transfer of Care (Signed)
Immediate Anesthesia Transfer of Care Note  Patient: Charie Pinkus Zieske  Procedure(s) Performed: COMPUTER ASSISTED TOTAL KNEE ARTHROPLASTY (Right: Knee)  Patient Location: PACU  Anesthesia Type:Spinal  Level of Consciousness: awake, alert  and oriented  Airway & Oxygen Therapy: Patient Spontanous Breathing and Patient connected to face mask oxygen  Post-op Assessment: Report given to RN and Post -op Vital signs reviewed and stable  Post vital signs: Reviewed and stable  Last Vitals:  Vitals Value Taken Time  BP    Temp    Pulse 56 01/27/21 1236  Resp 18 01/27/21 1236  SpO2 95 % 01/27/21 1236  Vitals shown include unvalidated device data.  Last Pain:  Vitals:   01/27/21 0717  TempSrc: Oral  PainSc: 0-No pain         Complications: No notable events documented.

## 2021-01-27 NOTE — H&P (Signed)
The patient has been re-examined, and the chart reviewed, and there have been no interval changes to the documented history and physical.    The risks, benefits, and alternatives have been discussed at length. The patient expressed understanding of the risks benefits and agreed with plans for surgical intervention.  Caylin Nass P. Letricia Krinsky, Jr. M.D.    

## 2021-01-27 NOTE — Op Note (Signed)
OPERATIVE NOTE  DATE OF SURGERY:  01/27/2021  PATIENT NAME:  HOLLIN CREWE   DOB: 02/03/1946  MRN: [id number]  PRE-OPERATIVE DIAGNOSIS: Degenerative arthrosis of the right knee, primary  POST-OPERATIVE DIAGNOSIS:  Same  PROCEDURE:  Right total knee arthroplasty using computer-assisted navigation  SURGEON:  Jena Gauss. M.D.  ANESTHESIA: spinal  ESTIMATED BLOOD LOSS: 200 mL  FLUIDS REPLACED: 2000 mL of crystalloid  TOURNIQUET TIME: #1 - 17 minutes #2 - 17 minutes  DRAINS: 2 medium Hemovac drains  SOFT TISSUE RELEASES: Anterior cruciate ligament, posterior cruciate ligament, deep medial collateral ligament, patellofemoral ligament  IMPLANTS UTILIZED: DePuy Attune size 5N posterior stabilized femoral component (cemented), size 3 rotating platform tibial component (cemented), 38 mm medialized dome patella (cemented), and a 10 mm stabilized rotating platform polyethylene insert.  INDICATIONS FOR SURGERY: TASHAI CATINO is a 75 y.o. year old female with a long history of progressive knee pain. X-rays demonstrated severe degenerative changes in tricompartmental fashion. The patient had not seen any significant improvement despite conservative nonsurgical intervention. After discussion of the risks and benefits of surgical intervention, the patient expressed understanding of the risks benefits and agree with plans for total knee arthroplasty.   The risks, benefits, and alternatives were discussed at length including but not limited to the risks of infection, bleeding, nerve injury, stiffness, blood clots, the need for revision surgery, cardiopulmonary complications, among others, and they were willing to proceed.  PROCEDURE IN DETAIL: The patient was brought into the operating room and, after adequate spinal anesthesia was achieved, a tourniquet was placed on the patient's upper thigh. The patient's knee and leg were cleaned and prepped with alcohol and DuraPrep and draped in the usual  sterile fashion. A "timeout" was performed as per usual protocol. The lower extremity was exsanguinated using an Esmarch, and the tourniquet was inflated to 300 mmHg. An anterior longitudinal incision was made followed by a standard mid vastus approach. The deep fibers of the medial collateral ligament were elevated in a subperiosteal fashion off of the medial flare of the tibia so as to maintain a continuous soft tissue sleeve. The patella was subluxed laterally and the patellofemoral ligament was incised. Inspection of the knee demonstrated severe degenerative changes with full-thickness loss of articular cartilage. Osteophytes were debrided using a rongeur. Anterior and posterior cruciate ligaments were excised.  Due to the amount of oozing, it was felt that a venous tourniquet was not effective, so it was elected to deflate the tourniquet after initial tourniquet time of 17 minutes.  Two 4.0 mm Schanz pins were inserted in the femur and into the tibia for attachment of the array of trackers used for computer-assisted navigation. Hip center was identified using a circumduction technique. Distal landmarks were mapped using the computer. The distal femur and proximal tibia were mapped using the computer. The distal femoral cutting guide was positioned using computer-assisted navigation so as to achieve a 5 distal valgus cut. The femur was sized and it was felt that a size 5N femoral component was appropriate. A size 5 femoral cutting guide was positioned and the anterior cut was performed and verified using the computer. This was followed by completion of the posterior and chamfer cuts. Femoral cutting guide for the central box was then positioned in the center box cut was performed.  Attention was then directed to the proximal tibia. Medial and lateral menisci were excised. The extramedullary tibial cutting guide was positioned using computer-assisted navigation so as to achieve a 0 varus-valgus  alignment and  3 posterior slope. The cut was performed and verified using the computer. The proximal tibia was sized and it was felt that a size 3 tibial tray was appropriate. Tibial and femoral trials were inserted followed by insertion of a 10 mm polyethylene insert. This allowed for excellent mediolateral soft tissue balancing both in flexion and in full extension. Finally, the patella was cut and prepared so as to accommodate a 38 mm medialized dome patella. A patella trial was placed and the knee was placed through a range of motion with excellent patellar tracking appreciated. The femoral trial was removed after debridement of posterior osteophytes. The central post-hole for the tibial component was reamed followed by insertion of a keel punch. Tibial trials were then removed.  The knee was exsanguinated using Esmarch, tourniquet was inflated to 300 mmHg.  Cut surfaces of bone were irrigated with copious amounts of normal saline using pulsatile lavage and then suctioned dry. Polymethylmethacrylate cement with gentamicin was prepared in the usual fashion using a vacuum mixer. Cement was applied to the cut surface of the proximal tibia as well as along the undersurface of a size 3 rotating platform tibial component. Tibial component was positioned and impacted into place. Excess cement was removed using Personal assistant. Cement was then applied to the cut surfaces of the femur as well as along the posterior flanges of the size 5N femoral component. The femoral component was positioned and impacted into place. Excess cement was removed using Personal assistant. A 10 mm polyethylene trial was inserted and the knee was brought into full extension with steady axial compression applied. Finally, cement was applied to the backside of a 38 mm medialized dome patella and the patellar component was positioned and patellar clamp applied. Excess cement was removed using Personal assistant. After adequate curing of the cement, the tourniquet  was deflated after a second tourniquet time of 17 minutes. Hemostasis was achieved using electrocautery. The knee was irrigated with copious amounts of normal saline using pulsatile lavage followed by 500 ml of Surgiphor and then suctioned dry. 20 mL of 1.3% Exparel and 60 mL of 0.25% Marcaine in 40 mL of normal saline was injected along the posterior capsule, medial and lateral gutters, and along the arthrotomy site. A 10 mm stabilized rotating platform polyethylene insert was inserted and the knee was placed through a range of motion with excellent mediolateral soft tissue balancing appreciated and excellent patellar tracking noted. 2 medium drains were placed in the wound bed and brought out through separate stab incisions. The medial parapatellar portion of the incision was reapproximated using interrupted sutures of #1 Vicryl. Subcutaneous tissue was approximated in layers using first #0 Vicryl followed #2-0 Vicryl. The skin was approximated with skin staples. A sterile dressing was applied.  The patient tolerated the procedure well and was transported to the recovery room in stable condition.    Rajveer Handler P. Angie Fava., M.D.

## 2021-01-27 NOTE — Anesthesia Preprocedure Evaluation (Signed)
Anesthesia Evaluation  Patient identified by MRN, date of birth, ID band Patient awake    Reviewed: Allergy & Precautions, H&P , NPO status , Patient's Chart, lab work & pertinent test results, reviewed documented beta blocker date and time   History of Anesthesia Complications Negative for: history of anesthetic complications  Airway Mallampati: I  TM Distance: >3 FB Neck ROM: full    Dental  (+) Caps, Dental Advidsory Given, Chipped   Pulmonary neg pulmonary ROS,           Cardiovascular Exercise Tolerance: Good hypertension, On Medications and On Home Beta Blockers (-) angina(-) CAD, (-) Past MI, (-) Cardiac Stents and (-) CABG (-) dysrhythmias (-) Valvular Problems/Murmurs     Neuro/Psych neg Seizures PSYCHIATRIC DISORDERS (Depression) Anxiety Depression  Neuromuscular disease    GI/Hepatic negative GI ROS, Neg liver ROS,   Endo/Other  diabetes, Type 1, Insulin DependentHypothyroidism   Renal/GU CRFRenal disease  negative genitourinary   Musculoskeletal   Abdominal   Peds  Hematology negative hematology ROS (+)   Anesthesia Other Findings Past Medical History: No date: Depression No date: Diabetes mellitus without complication (HCC) No date: History of shingles No date: Hyperlipidemia No date: Hypertension No date: Hypothyroidism No date: Obesity   Reproductive/Obstetrics negative OB ROS                             Anesthesia Physical  Anesthesia Plan  ASA: III  Anesthesia Plan: Spinal   Post-op Pain Management:    Induction:   PONV Risk Score and Plan: Propofol infusion and TIVA  Airway Management Planned: Simple Face Mask and Natural Airway  Additional Equipment:   Intra-op Plan:   Post-operative Plan:   Informed Consent: I have reviewed the patients History and Physical, chart, labs and discussed the procedure including the risks, benefits and alternatives for  the proposed anesthesia with the patient or authorized representative who has indicated his/her understanding and acceptance.     Dental Advisory Given  Plan Discussed with: Anesthesiologist, CRNA and Surgeon  Anesthesia Plan Comments: (Patient reports no bleeding problems and no anticoagulant use.  Plan for spinal with backup GA  Patient consented for risks of anesthesia including but not limited to:  - adverse reactions to medications - damage to eyes, teeth, lips or other oral mucosa - nerve damage due to positioning  - risk of bleeding, infection and or nerve damage from spinal that could lead to paralysis - risk of headache or failed spinal - damage to teeth, lips or other oral mucosa - sore throat or hoarseness - damage to heart, brain, nerves, lungs, other parts of body or loss of life  Patient voiced understanding.)        Anesthesia Quick Evaluation

## 2021-01-28 ENCOUNTER — Encounter: Payer: Self-pay | Admitting: Orthopedic Surgery

## 2021-01-28 DIAGNOSIS — E039 Hypothyroidism, unspecified: Secondary | ICD-10-CM | POA: Diagnosis not present

## 2021-01-28 DIAGNOSIS — Z96652 Presence of left artificial knee joint: Secondary | ICD-10-CM | POA: Diagnosis not present

## 2021-01-28 DIAGNOSIS — M1711 Unilateral primary osteoarthritis, right knee: Secondary | ICD-10-CM | POA: Diagnosis not present

## 2021-01-28 DIAGNOSIS — Z7982 Long term (current) use of aspirin: Secondary | ICD-10-CM | POA: Diagnosis not present

## 2021-01-28 DIAGNOSIS — Z794 Long term (current) use of insulin: Secondary | ICD-10-CM | POA: Diagnosis not present

## 2021-01-28 DIAGNOSIS — Z79899 Other long term (current) drug therapy: Secondary | ICD-10-CM | POA: Diagnosis not present

## 2021-01-28 DIAGNOSIS — N1832 Chronic kidney disease, stage 3b: Secondary | ICD-10-CM | POA: Diagnosis not present

## 2021-01-28 DIAGNOSIS — I129 Hypertensive chronic kidney disease with stage 1 through stage 4 chronic kidney disease, or unspecified chronic kidney disease: Secondary | ICD-10-CM | POA: Diagnosis not present

## 2021-01-28 DIAGNOSIS — E1022 Type 1 diabetes mellitus with diabetic chronic kidney disease: Secondary | ICD-10-CM | POA: Diagnosis not present

## 2021-01-28 LAB — GLUCOSE, CAPILLARY
Glucose-Capillary: 360 mg/dL — ABNORMAL HIGH (ref 70–99)
Glucose-Capillary: 266 mg/dL — ABNORMAL HIGH (ref 70–99)
Glucose-Capillary: 222 mg/dL — ABNORMAL HIGH (ref 70–99)
Glucose-Capillary: 187 mg/dL — ABNORMAL HIGH (ref 70–99)

## 2021-01-28 MED ORDER — ENOXAPARIN SODIUM 40 MG/0.4ML IJ SOSY: 40.0000 mg | PREFILLED_SYRINGE | INTRAMUSCULAR | 0 refills | Status: DC

## 2021-01-28 MED ORDER — ONDANSETRON HCL 4 MG PO TABS: 4.0000 mg | ORAL_TABLET | Freq: Four times a day (QID) | ORAL | 0 refills | Status: DC | PRN

## 2021-01-28 MED ORDER — TRAMADOL HCL 50 MG PO TABS: 50.0000 mg | ORAL_TABLET | Freq: Four times a day (QID) | ORAL | 0 refills | Status: DC | PRN

## 2021-01-28 MED ORDER — OXYCODONE HCL 5 MG PO TABS: 5.0000 mg | ORAL_TABLET | ORAL | 0 refills | Status: DC | PRN

## 2021-01-28 MED ORDER — CELECOXIB 200 MG PO CAPS: 200.0000 mg | ORAL_CAPSULE | Freq: Two times a day (BID) | ORAL | 0 refills | Status: DC

## 2021-01-28 NOTE — Progress Notes (Signed)
Physical Therapy Treatment Patient Details Name: Mary [name] MRN: [id number] DOB: 1946/06/20 Today's Date: 01/28/2021    History of Present Illness Pt admitted for R TKR and is POD 0 at time of evaluation. PMH to include:  Carpal tunnel syndrome, bilateral, Depression, peripheral neuropathy associated with type 1 diabetes mellitus, Diabetic retinal damage of both eyes, DM, diabetic retinopathy + macular edema s/p laser surgery, History of shingles, Hyperlipidemia, Hypertension, Hypothyroidism, Obesity and Osteopenia    PT Comments    Patient received on edge of bed finishing with OT session. She is agreeable to PT session. Patient reports pain at 3/10. She requires min guard for sit to stand and for ambulation with RW. Patient ambulated 70 feet with cues for sequencing and safety. Performed seated LE exercises in recliner with cues and handout. Patient will continue to benefit from skilled PT while here to improve strength, rom and functional independence.        Follow Up Recommendations  Home health PT     Equipment Recommendations  None recommended by PT    Recommendations for Other Services       Precautions / Restrictions Precautions Precautions: Fall Precaution Booklet Issued: No Restrictions Weight Bearing Restrictions: Yes RLE Weight Bearing: Weight bearing as tolerated    Mobility  Bed Mobility Overal bed mobility: Needs Assistance Bed Mobility: Supine to Sit     Supine to sit: Supervision     General bed mobility comments: patient received sitting up on side of bed with OT present    Transfers Overall transfer level: Needs assistance Equipment used: Rolling walker (2 wheeled) Transfers: Sit to/from Stand Sit to Stand: Min guard Stand pivot transfers: Min guard       General transfer comment: pt fearful of mobility. Once standing, slightly dizzy, improves with time. No nausea  Ambulation/Gait Ambulation/Gait assistance: Min guard Gait Distance  (Feet): 70 Feet Assistive device: Rolling walker (2 wheeled) Gait Pattern/deviations: Decreased step length - right;Decreased step length - left;Shuffle;Step-to pattern Gait velocity: decr   General Gait Details: small, shuffle steps, cues for correct use of rw and sequencing.   Stairs             Wheelchair Mobility    Modified Rankin (Stroke Patients Only)       Balance Overall balance assessment: Needs assistance Sitting-balance support: Feet supported Sitting balance-Leahy Scale: Good     Standing balance support: Bilateral upper extremity supported;During functional activity Standing balance-Leahy Scale: Fair Standing balance comment: reliant on B UE support and min guard                            Cognition Arousal/Alertness: Awake/alert Behavior During Therapy: WFL for tasks assessed/performed Overall Cognitive Status: Within Functional Limits for tasks assessed                                        Exercises Total Joint Exercises Ankle Circles/Pumps: AROM;Both;10 reps Quad Sets: AROM;Right;10 reps Heel Slides: AAROM;Right;10 reps Hip ABduction/ADduction: AROM;Right;10 reps Straight Leg Raises: AROM;Right;10 reps Long Arc Quad: AROM;Right;10 reps Goniometric ROM: 0-80    General Comments        Pertinent Vitals/Pain Pain Assessment: 0-10 Pain Score: 3  Pain Location: R knee Pain Descriptors / Indicators: Discomfort;Sore Pain Intervention(s): Monitored during session;Ice applied;Repositioned    Home Living Family/patient expects to be discharged to:: Private residence  Living Arrangements: Children;Other relatives (daughter) Available Help at Discharge: Family;Available 24 hours/day Type of Home: House Home Access: Stairs to enter Entrance Stairs-Rails: Left Home Layout: One level Home Equipment: Walker - 2 wheels;Bedside commode;Cane - single point;Other (comment) Additional Comments: reacher    Prior Function  Level of Independence: Independent with assistive device(s)      Comments: uses SPC at baseline   PT Goals (current goals can now be found in the care plan section) Acute Rehab PT Goals Patient Stated Goal: to go home PT Goal Formulation: With patient Time For Goal Achievement: 02/10/21 Potential to Achieve Goals: Good Additional Goals Additional Goal #1: Pt will be able to perform bed mobility/transfers with supervision and safe technique in order to improve functional independence Progress towards PT goals: Progressing toward goals    Frequency    BID      PT Plan Current plan remains appropriate    Co-evaluation              AM-PAC PT "6 Clicks" Mobility   Outcome Measure  Help needed turning from your back to your side while in a flat bed without using bedrails?: A Little Help needed moving from lying on your back to sitting on the side of a flat bed without using bedrails?: A Little Help needed moving to and from a bed to a chair (including a wheelchair)?: A Little Help needed standing up from a chair using your arms (e.g., wheelchair or bedside chair)?: A Little Help needed to walk in hospital room?: A Little Help needed climbing 3-5 steps with a railing? : A Little 6 Click Score: 18    End of Session Equipment Utilized During Treatment: Gait belt Activity Tolerance: Patient tolerated treatment well;Patient limited by fatigue;Patient limited by pain Patient left: in chair;with chair alarm set;with call bell/phone within reach Nurse Communication: Mobility status PT Visit Diagnosis: Difficulty in walking, not elsewhere classified (R26.2);Pain;Muscle weakness (generalized) (M62.81);Other abnormalities of gait and mobility (R26.89) Pain - Right/Left: Right Pain - part of body: Knee     Time: 1021-1040 PT Time Calculation (min) (ACUTE ONLY): 19 min  Charges:  $Gait Training: 8-22 mins                    Raven Furnas, PT, GCS 01/28/21,11:19 AM

## 2021-01-28 NOTE — Progress Notes (Signed)
Met with the patient to discuss DC plan and needs She lives at home and her daughter lives with her and will be providing transportation She has a 3 in 1 and a RW at home She can afford her medications She is set up with [place] prior to surgery by the Physician office

## 2021-01-28 NOTE — Progress Notes (Signed)
Physical Therapy Treatment Patient Details Name: Mary [name] MRN: [id number] DOB: 1946-02-12 Today's Date: 01/28/2021    History of Present Illness Pt admitted for R TKR and is POD 0 at time of evaluation. PMH to include:  Carpal tunnel syndrome, bilateral, Depression, peripheral neuropathy associated with type 1 diabetes mellitus, Diabetic retinal damage of both eyes, DM, diabetic retinopathy + macular edema s/p laser surgery, History of shingles, Hyperlipidemia, Hypertension, Hypothyroidism, Obesity and Osteopenia    PT Comments    Patient received in recliner, reports she is sleepy and ready to take a nap. Patient agreeable to PT session. Performed sit to stand with min guard/supervision. Ambulated 90 feet with RW and min guard. Cues for increasing step length and to stay inside and close to RW. Patient ambulated up/down 4 steps with B rails and supervision. She required wheelchair to get all the way to steps and then back to room due to fatigue. Patient performed bed level exercises at end of session. She will continue to benefit from skilled PT while here to improve functional independence, strength and safety.       Follow Up Recommendations  Home health PT     Equipment Recommendations  None recommended by PT    Recommendations for Other Services       Precautions / Restrictions Precautions Precautions: Fall Precaution Booklet Issued: No Restrictions Weight Bearing Restrictions: Yes RLE Weight Bearing: Weight bearing as tolerated    Mobility  Bed Mobility Overal bed mobility: Modified Independent Bed Mobility: Sit to Supine     Supine to sit: Modified independent (Device/Increase time)     General bed mobility comments: use of bed rail but no physical assist needed for sit to supine.    Transfers Overall transfer level: Needs assistance Equipment used: Rolling walker (2 wheeled) Transfers: Sit to/from Stand Sit to Stand: Supervision Stand pivot transfers: Min  guard       General transfer comment: supervision for safety  Ambulation/Gait Ambulation/Gait assistance: Min guard Gait Distance (Feet): 90 Feet Assistive device: Rolling walker (2 wheeled) Gait Pattern/deviations: Step-to pattern;Decreased step length - right;Decreased step length - left;Decreased weight shift to right;Shuffle Gait velocity: decr   General Gait Details: very small shuffle steps. Cues to increase step length. Cues to stay close to RW.   Stairs Stairs: Yes Stairs assistance: Supervision Stair Management: Step to pattern;Two rails Number of Stairs: 4 General stair comments: patient required cues for stair training. has one rail on left but will have grandson on her right to assist when getting in house.   Wheelchair Mobility    Modified Rankin (Stroke Patients Only)       Balance Overall balance assessment: Needs assistance Sitting-balance support: Feet supported Sitting balance-Leahy Scale: Normal     Standing balance support: Bilateral upper extremity supported;During functional activity Standing balance-Leahy Scale: Fair Standing balance comment: reliant on B UE support and min guard                            Cognition Arousal/Alertness: Awake/alert Behavior During Therapy: WFL for tasks assessed/performed Overall Cognitive Status: Within Functional Limits for tasks assessed                                        Exercises Total Joint Exercises Ankle Circles/Pumps: AROM;10 reps;Both Quad Sets: AROM;10 reps;Right Heel Slides: AROM;Right;10 reps Hip ABduction/ADduction: AROM;Right;10  reps Straight Leg Raises: AROM;Right;10 reps Long Arc Quad: AROM;Right;10 reps Goniometric ROM: 0-84    General Comments        Pertinent Vitals/Pain Pain Assessment: 0-10 Pain Score: 4  Pain Location: R knee Pain Descriptors / Indicators: Discomfort;Sore Pain Intervention(s): Monitored during session;Ice applied;Repositioned     Home Living Family/patient expects to be discharged to:: Private residence Living Arrangements: Children;Other relatives (daughter) Available Help at Discharge: Family;Available 24 hours/day Type of Home: House Home Access: Stairs to enter Entrance Stairs-Rails: Left Home Layout: One level Home Equipment: Walker - 2 wheels;Bedside commode;Cane - single point;Other (comment) Additional Comments: reacher    Prior Function Level of Independence: Independent with assistive device(s)      Comments: uses SPC at baseline   PT Goals (current goals can now be found in the care plan section) Acute Rehab PT Goals Patient Stated Goal: to go home PT Goal Formulation: With patient Time For Goal Achievement: 02/10/21 Potential to Achieve Goals: Good Additional Goals Additional Goal #1: Pt will be able to perform bed mobility/transfers with supervision and safe technique in order to improve functional independence Progress towards PT goals: Progressing toward goals    Frequency    BID      PT Plan Current plan remains appropriate    Co-evaluation              AM-PAC PT "6 Clicks" Mobility   Outcome Measure  Help needed turning from your back to your side while in a flat bed without using bedrails?: A Little Help needed moving from lying on your back to sitting on the side of a flat bed without using bedrails?: A Little Help needed moving to and from a bed to a chair (including a wheelchair)?: A Little Help needed standing up from a chair using your arms (e.g., wheelchair or bedside chair)?: A Little Help needed to walk in hospital room?: A Little Help needed climbing 3-5 steps with a railing? : A Little 6 Click Score: 18    End of Session Equipment Utilized During Treatment: Gait belt Activity Tolerance: Patient limited by fatigue Patient left: in bed;with call bell/phone within reach;Other (comment) (unable to turn on bed alarm) Nurse Communication: Mobility status PT  Visit Diagnosis: Difficulty in walking, not elsewhere classified (R26.2);Pain;Muscle weakness (generalized) (M62.81);Other abnormalities of gait and mobility (R26.89) Pain - Right/Left: Right Pain - part of body: Knee     Time: 1335-1405 PT Time Calculation (min) (ACUTE ONLY): 30 min  Charges:  $Gait Training: 8-22 mins $Therapeutic Exercise: 8-22 mins                    Shaylyn Bawa, PT, GCS 01/28/21,2:21 PM

## 2021-01-28 NOTE — Progress Notes (Signed)
Inpatient Diabetes Program Recommendations  AACE/ADA: New Consensus Statement on Inpatient Glycemic Control (2015)  Target Ranges:  Prepandial:   less than 140 mg/dL      Peak postprandial:   less than 180 mg/dL (1-2 hours)      Critically ill patients:  140 - 180 mg/dL   Lab Results  Component Value Date   GLUCAP 360 (H) 01/28/2021   HGBA1C 7.3 (H) 01/15/2021    Review of Glycemic Control Results for [name], Mary [name] (MRN [id number]) as of 01/28/2021 10:51  Ref. Range 01/27/2021 12:34 01/27/2021 16:31 01/27/2021 16:50 01/27/2021 21:12 01/28/2021 07:39  Glucose-Capillary Latest Ref Range: 70 - 99 mg/dL 732 (H) 202 (H) 542 (H) 379 (H) 360 (H)   Diabetes history: DM 2 Outpatient Diabetes medications: Lantus 32 units, Humalog 2-10 uits 4x/day Current orders for Inpatient glycemic control:  Lantus 32 units Novolog 0-15 units tid + hs  Decadron 8 mg given during surgery glucose 360 this am.  Inpatient Diabetes Program Recommendations:    - Increase Lantus to 35 units - Increase Novolog Correction to 0-20 units Q4 hours  Thanks,  Christena Deem RN, MSN, BC-ADM Inpatient Diabetes Coordinator Team Pager [phone] (8a-5p)

## 2021-01-28 NOTE — Progress Notes (Signed)
ORTHOPAEDICS PROGRESS NOTE  PATIENT NAME: Mary [name] DOB: [birth year]  MRN: [id number]  POD # 1: Right total knee arthroplasty  Subjective: The patient rested well last night.  Pain is been under good control.  She denies any nausea or vomiting. The patient tolerated physical therapy well yesterday.  Objective: Vital signs in last 24 hours: Temp:  [97.1 F (36.2 C)-99 F (37.2 C)] 99 F (37.2 C) (07/07 0452) Pulse Rate:  [50-76] 61 (07/07 0452) Resp:  [10-25] 19 (07/07 0452) BP: (102-219)/(39-80) 148/52 (07/07 0452) SpO2:  [90 %-100 %] 97 % (07/07 0452) Weight:  [96.8 kg] 96.8 kg (07/06 0717)  Intake/Output from previous day: 07/06 0701 - 07/07 0700 In: 4466.6 [P.O.:237; I.V.:3529.6; IV Piggyback:700] Out: 990 [Urine:650; Drains:140; Blood:200]  No results for input(s): WBC, HGB, HCT, PLT, K, CL, CO2, BUN, CREATININE, GLUCOSE, CALCIUM, LABPT, INR in the last 72 hours.  EXAM General: Well-developed well-nourished female seen in no apparent discomfort. Lungs: clear to auscultation Cardiac: normal rate and regular rhythm Right lower extremity: Dressing is dry and intact.  Polar Care and Hemovac are in place and functioning.  The patient is able to perform a straight leg raise with minimal assistance.  Homans test is negative. Neurologic: Awake, alert, oriented.  Sensory and motor function are intact.  Assessment: Right total knee arthroplasty  Secondary diagnoses: Chronic kidney disease, stage III Depression Diabetes Peripheral neuropathy Diabetic retinal damage to both eyes Hyperlipidemia Hypertension Hypothyroidism  Plan: Today's goals were reviewed with the patient.  Continue physical therapy and Occupational Therapy as per total knee arthroplasty rehab protocol. Plan is to go Home after hospital stay. DVT Prophylaxis - Lovenox, Foot Pumps, and TED hose  Jayanth Szczesniak P. Angie Fava M.D.

## 2021-01-28 NOTE — Evaluation (Signed)
Occupational Therapy Evaluation Patient Details Name: Mary [name] MRN: [id number] DOB: 1946-02-26 Today's Date: 01/28/2021    History of Present Illness Pt admitted for R TKR and is POD 0 at time of evaluation.   Clinical Impression   Patient presenting with decreased I in self care, balance, functional mobility/transfer, endurance, and safety awareness. Patient reports being independent with self care tasks and lives with daughter who performs IADLs. Patient currently functioning at min guard- min A for functional transfers, LB dressing, and standing balance. OT educated pt on use of polar care and discussed how to increase LB dressing with use of LH reacher.Pt verbalized understanding and very motivated for OT intervention this session.  Patient will benefit from acute OT to increase overall independence in the areas of ADLs, functional mobility, and safety awareness in order to safely discharge home with family.    Follow Up Recommendations  Home health OT;Supervision - Intermittent    Equipment Recommendations  None recommended by OT       Precautions / Restrictions Precautions Precautions: Fall;Knee Restrictions Weight Bearing Restrictions: Yes RLE Weight Bearing: Weight bearing as tolerated      Mobility Bed Mobility Overal bed mobility: Needs Assistance Bed Mobility: Supine to Sit     Supine to sit: Supervision     General bed mobility comments: min cuing for technique with no physical assist provided    Transfers Overall transfer level: Needs assistance Equipment used: Rolling walker (2 wheeled) Transfers: Sit to/from [employer] Sit to Stand: Min guard Stand pivot transfers: Min guard       General transfer comment: pt fearful of mobility. Once standing, slightly dizzy, improves with time. No nausea    Balance Overall balance assessment: Needs assistance Sitting-balance support: Feet supported Sitting balance-Leahy Scale: Good      Standing balance support: Bilateral upper extremity supported Standing balance-Leahy Scale: Good                             ADL either performed or assessed with clinical judgement   ADL Overall ADL's : Needs assistance/impaired     Grooming: Wash/dry face;Wash/dry hands;Standing;Supervision/safety           Upper Body Dressing : Set up;Sitting   Lower Body Dressing: Minimal assistance;Sit to/from stand Lower Body Dressing Details (indicate cue type and reason): min A to thread onto R foot and min guard for LB dressing while standing Toilet Transfer: Minimal assistance;Regular Toilet;Ambulation   Toileting- Clothing Manipulation and Hygiene: Min guard Toileting - Clothing Manipulation Details (indicate cue type and reason): for clothing management     Functional mobility during ADLs: Min guard;Rolling walker       Vision Baseline Vision/History: No visual deficits              Pertinent Vitals/Pain Pain Assessment: 0-10 Pain Score: 3  Pain Location: R knee Pain Descriptors / Indicators: Aching Pain Intervention(s): Limited activity within patient's tolerance;Monitored during session;Repositioned;Patient requesting pain meds-RN notified     Hand Dominance Right   Extremity/Trunk Assessment Upper Extremity Assessment Upper Extremity Assessment: Overall WFL for tasks assessed   Lower Extremity Assessment Lower Extremity Assessment: Generalized weakness       Communication Communication Communication: No difficulties   Cognition Arousal/Alertness: Awake/alert Behavior During Therapy: WFL for tasks assessed/performed Overall Cognitive Status: Within Functional Limits for tasks assessed  Home Living Family/patient expects to be discharged to:: Private residence Living Arrangements: Children;Other relatives (daughter) Available Help at Discharge: Family;Available 24  hours/day Type of Home: House Home Access: Stairs to enter [employer] of Steps: 3 Entrance Stairs-Rails: Left Home Layout: One level     Bathroom Shower/Tub: Chief Strategy Officer: Standard     Home Equipment: Environmental consultant - 2 wheels;Bedside commode;Cane - single point;Other (comment)   Additional Comments: reacher      Prior Functioning/Environment Level of Independence: Independent with assistive device(s)        Comments: uses SPC at baseline        OT Problem List: Decreased strength;Decreased range of motion;Decreased activity tolerance;Impaired balance (sitting and/or standing);Decreased safety awareness;Pain;Decreased knowledge of use of DME or AE      OT Treatment/Interventions: Self-care/ADL training;Manual therapy;Therapeutic exercise;Modalities;Patient/family education;Balance training;Energy conservation;Therapeutic activities;DME and/or AE instruction    OT Goals(Current goals can be found in the care plan section) Acute Rehab OT Goals Patient Stated Goal: to go home OT Goal Formulation: With patient Time For Goal Achievement: 02/11/21 Potential to Achieve Goals: Good ADL Goals Pt Will Perform Grooming: with modified independence;standing Pt Will Perform Lower Body Dressing: with modified independence;sit to/from stand Pt Will Transfer to Toilet: with modified independence;ambulating Pt Will Perform Toileting - Clothing Manipulation and hygiene: with modified independence;sit to/from stand  OT Frequency: Min 2X/week   Barriers to D/C:    none known at this time          AM-PAC OT "6 Clicks" Daily Activity     Outcome Measure Help from another person eating meals?: None Help from another person taking care of personal grooming?: None Help from another person toileting, which includes using toliet, bedpan, or urinal?: A Little Help from another person bathing (including washing, rinsing, drying)?: A Little Help from another person  to put on and taking off regular upper body clothing?: None Help from another person to put on and taking off regular lower body clothing?: A Little 6 Click Score: 21   End of Session Equipment Utilized During Treatment: Rolling walker Nurse Communication: Mobility status  Activity Tolerance: Patient tolerated treatment well Patient left: Other (comment) (seated on EOB with PT entering the room)  OT Visit Diagnosis: Unsteadiness on feet (R26.81);Muscle weakness (generalized) (M62.81)                Time: 1000-1024 OT Time Calculation (min): 24 min Charges:  OT General Charges $OT Visit: 1 Visit OT Evaluation $OT Eval Low Complexity: 1 Low OT Treatments $Self Care/Home Management : 8-22 mins  Jackquline Denmark, MS, OTR/L , CBIS ascom [phone]  01/28/21, 12:55 PM

## 2021-01-29 DIAGNOSIS — Z79899 Other long term (current) drug therapy: Secondary | ICD-10-CM | POA: Diagnosis not present

## 2021-01-29 DIAGNOSIS — Z794 Long term (current) use of insulin: Secondary | ICD-10-CM | POA: Diagnosis not present

## 2021-01-29 DIAGNOSIS — Z96652 Presence of left artificial knee joint: Secondary | ICD-10-CM | POA: Diagnosis not present

## 2021-01-29 DIAGNOSIS — E1022 Type 1 diabetes mellitus with diabetic chronic kidney disease: Secondary | ICD-10-CM | POA: Diagnosis not present

## 2021-01-29 DIAGNOSIS — N1832 Chronic kidney disease, stage 3b: Secondary | ICD-10-CM | POA: Diagnosis not present

## 2021-01-29 DIAGNOSIS — Z7982 Long term (current) use of aspirin: Secondary | ICD-10-CM | POA: Diagnosis not present

## 2021-01-29 DIAGNOSIS — M1711 Unilateral primary osteoarthritis, right knee: Secondary | ICD-10-CM | POA: Diagnosis not present

## 2021-01-29 DIAGNOSIS — I129 Hypertensive chronic kidney disease with stage 1 through stage 4 chronic kidney disease, or unspecified chronic kidney disease: Secondary | ICD-10-CM | POA: Diagnosis not present

## 2021-01-29 DIAGNOSIS — E039 Hypothyroidism, unspecified: Secondary | ICD-10-CM | POA: Diagnosis not present

## 2021-01-29 LAB — GLUCOSE, CAPILLARY
Glucose-Capillary: 163 mg/dL — ABNORMAL HIGH (ref 70–99)
Glucose-Capillary: 199 mg/dL — ABNORMAL HIGH (ref 70–99)
Glucose-Capillary: 249 mg/dL — ABNORMAL HIGH (ref 70–99)

## 2021-01-29 MED ORDER — LACTULOSE 10 GM/15ML PO SOLN
20.0000 g | Freq: Two times a day (BID) | ORAL | Status: DC | PRN
  Filled 2021-01-29: qty 30

## 2021-01-29 NOTE — Progress Notes (Signed)
  Subjective: 2 Days Post-Op Procedure(s) (LRB): COMPUTER ASSISTED TOTAL KNEE ARTHROPLASTY (Right) Patient reports pain as mild.   Patient is well, and has had no acute complaints or problems Plan is to go Home after hospital stay. Negative for chest pain and shortness of breath Fever: no Gastrointestinal:Negative for nausea and vomiting Patient reports that she is passing gas without pain.  Objective: Vital signs in last 24 hours: Temp:  [97.6 F (36.4 C)-98.1 F (36.7 C)] 97.6 F (36.4 C) (07/08 0822) Pulse Rate:  [58-63] 62 (07/08 0822) Resp:  [16-18] 16 (07/08 0822) BP: (103-160)/(37-56) 160/56 (07/08 0822) SpO2:  [95 %-97 %] 97 % (07/08 0822)  Intake/Output from previous day:  Intake/Output Summary (Last 24 hours) at 01/29/2021 0845 Last data filed at 01/28/2021 1900 Gross per 24 hour  Intake --  Output 110 ml  Net -110 ml    Intake/Output this shift: No intake/output data recorded.  Labs: No results for input(s): HGB in the last 72 hours. No results for input(s): WBC, RBC, HCT, PLT in the last 72 hours. No results for input(s): NA, K, CL, CO2, BUN, CREATININE, GLUCOSE, CALCIUM in the last 72 hours. No results for input(s): LABPT, INR in the last 72 hours.  EXAM General - Patient is Alert, Appropriate, and Oriented Extremity - ABD soft Sensation intact distally Intact pulses distally Dorsiflexion/Plantar flexion intact No cellulitis present Bulky dressing was removed this morning.  Hemovac removed, mild bloody drainage noted. Dressing/Incision - Mild bloody drainage to the right knee, fresh honeycomb was applied. Motor Function - intact, moving foot and toes well on exam.  Abdomen soft with normal bowel sounds.  Past Medical History:  Diagnosis Date   Arthritis    degenerative arthritis left knee   Carpal tunnel syndrome, bilateral    Chronic kidney disease    stage 3   Depression    Diabetes mellitus without complication (HCC)    Diabetic peripheral  neuropathy associated with type 1 diabetes mellitus (HCC)    Diabetic retinal damage of both eyes (HCC)    History of shingles    Hyperlipidemia    Hypertension    Hypothyroidism    Obesity     Assessment/Plan: 2 Days Post-Op Procedure(s) (LRB): COMPUTER ASSISTED TOTAL KNEE ARTHROPLASTY (Right) Active Problems:   Total knee replacement status  Estimated body mass index is 35.51 kg/m as calculated from the following:   Height as of this encounter: 5\' 5"  (1.651 m).   Weight as of this encounter: 96.8 kg. Advance diet Up with therapy D/C IV fluids when tolerating po intake.  Vitals reviewed this AM. Bulky dressing removed today, hemovac removed. Up with therapy today. Patient is passing gas, continue to work on a BM. Plan for discharge home this afternoon pending a BM.  DVT Prophylaxis - Lovenox, Foot Pumps, and TED hose Weight-Bearing as tolerated to right leg  J. , PA-C [facility] 01/29/2021, 8:45 AM

## 2021-01-29 NOTE — Anesthesia Postprocedure Evaluation (Signed)
Anesthesia Post Note  Patient: Mary [name]  Procedure(s) Performed: COMPUTER ASSISTED TOTAL KNEE ARTHROPLASTY (Right: Knee)  Patient location during evaluation: Nursing Unit Anesthesia Type: Spinal Level of consciousness: oriented and awake and alert Pain management: pain level controlled Vital Signs Assessment: post-procedure vital signs reviewed and stable Respiratory status: spontaneous breathing Cardiovascular status: blood pressure returned to baseline and stable Postop Assessment: no headache, no backache, no apparent nausea or vomiting and patient able to bend at knees Anesthetic complications: no   No notable events documented.   Last Vitals:  Vitals:   01/28/21 2225 01/29/21 0515  BP: (!) 119/46 (!) 125/47  Pulse: 63 63  Resp: 17 17  Temp: 36.7 C 36.4 C  SpO2: 96% 95%    Last Pain:  Vitals:   01/29/21 0241  TempSrc:   PainSc: Asleep                 Cleda Mccreedy Natalin Bible

## 2021-01-29 NOTE — Discharge Summary (Signed)
Physician Discharge Summary  Patient ID: Mary [name] MRN: [id number] DOB/AGE: 1946/04/02 75 y.o.  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 ([place]) 05/24/2019   Status post total left knee replacement 09/17/2018   Primary osteoarthritis of both knees 07/01/2018   Primary osteoarthritis of right knee 07/01/2018   Stage 3b chronic kidney disease ([place]) 04/08/2015   Hypothyroidism 04/07/2015   Obesity 04/07/2015   Hyperlipidemia 04/07/2015   Depression, major 04/07/2015   Claustrophobia 04/07/2015   Hypertension 04/07/2015   Type 2 diabetes mellitus with diabetic chronic kidney disease ([place]) 09/30/2014    Past Medical History:  Diagnosis Date   Arthritis    degenerative arthritis left knee   Carpal tunnel syndrome, bilateral    Chronic kidney disease    stage 3   Depression    Diabetes mellitus without complication ([place])    Diabetic peripheral neuropathy associated with type 1 diabetes mellitus ([place])    Diabetic retinal damage of both eyes ([place])    History of shingles    Hyperlipidemia    Hypertension    Hypothyroidism    Obesity      Transfusion: None.   Consultants (if any):   Discharged Condition: Improved  Hospital Course: Mary [name] is an 75 y.o. female who was admitted 01/27/2021 with a diagnosis of primary degenerative arthrosis of the right knee and went to the operating room on 01/27/2021 and underwent the above named procedures.    Surgeries: Procedure(s): COMPUTER ASSISTED TOTAL KNEE ARTHROPLASTY on 01/27/2021 Patient tolerated the surgery well. Taken to PACU where she  was stabilized and then transferred to the orthopedic floor.  Started on Lovenox $RemoveBe'30mg'qMwcBatCY$  q 12 hrs. Foot pumps applied bilaterally at 80 mm. Heels elevated on bed with rolled towels. No evidence of DVT. Negative Homan. Physical therapy started on day #1 for gait training and transfer. OT started day #1 for ADL and assisted devices.  Patient's IV and hemovac were removed on POD2.  Foley was removed in the OR following surgery.  Implants: DePuy Attune size 5N posterior stabilized femoral component (cemented), size 3 rotating platform tibial component (cemented), 38 mm medialized dome patella (cemented), and a 10 mm stabilized rotating platform polyethylene insert.  She was given perioperative antibiotics:  Anti-infectives (From admission, onward)    Start     Dose/Rate Route Frequency Ordered Stop   01/27/21 1600  ceFAZolin (ANCEF) IVPB 2g/100 mL premix        2 g 200 mL/hr over 30 Minutes Intravenous Every 6 hours 01/27/21 1509 01/27/21 2134   01/27/21 0728  ceFAZolin (ANCEF) 2-4 GM/100ML-% IVPB       Note to Pharmacy: Trudie Reed   : cabinet override      01/27/21 0728 01/27/21 0850   01/27/21 0600  ceFAZolin (ANCEF) IVPB 2g/100 mL premix        2 g 200 mL/hr over 30 Minutes Intravenous On call to O.R. 01/27/21 0112 01/27/21 0855     .  She was given sequential compression devices, early ambulation, and Lovenox for DVT prophylaxis.  She benefited maximally from the hospital stay and there were no complications.    Recent vital signs:  Vitals:   01/29/21 0515 01/29/21 0822  BP: (!) 125/47 (!) 160/56  Pulse: 63 62  Resp: 17 16  Temp: 97.6 F (36.4 C) 97.6 F (36.4 C)  SpO2: 95% 97%   Recent laboratory studies:  Lab Results  Component Value Date   HGB 12.5 01/15/2021   HGB 12.9 01/12/2021   HGB 12.8 01/07/2020   Lab Results  Component Value Date   WBC 6.7 01/15/2021   PLT 178 01/15/2021   Lab Results  Component Value Date   INR 1.0 01/15/2021   Lab Results  Component  Value Date   NA 140 01/15/2021   K 4.0 01/15/2021   CL 105 01/15/2021   CO2 27 01/15/2021   BUN 44 (H) 01/15/2021   CREATININE 1.26 (H) 01/15/2021   GLUCOSE 76 01/15/2021   Discharge Medications:   Allergies as of 01/29/2021       Reactions   Hydrocodone Itching   Oxycodone Hcl Itching        Medication List     TAKE these medications    acetaminophen 650 MG CR tablet Commonly known as: TYLENOL Take 1,300 mg by mouth every 8 (eight) hours as needed for pain.   atorvastatin 10 MG tablet Commonly known as: LIPITOR Take 1 tablet (10 mg total) by mouth daily.   celecoxib 200 MG capsule Commonly known as: CELEBREX Take 1 capsule (200 mg total) by mouth 2 (two) times daily.   diclofenac Sodium 1 % Gel Commonly known as: VOLTAREN Apply 2 g topically 4 (four) times daily as needed (pain).   enoxaparin 40 MG/0.4ML injection Commonly known as: LOVENOX Inject 0.4 mLs (40 mg total) into the skin daily.   furosemide 20 MG tablet Commonly known as: LASIX Take 1 tablet (20 mg total) by mouth daily.   insulin lispro 100 UNIT/ML KiwkPen Commonly known as: HUMALOG Inject 2-10 Units into the skin 4 (four) times daily -  before meals and at bedtime. Per sliding scale   Insulin Pen Needle 31G X 8 MM Misc by Does not apply route.   Lantus SoloStar 100 UNIT/ML Solostar Pen Generic drug: insulin glargine Inject 32 Units into the skin at bedtime.   ondansetron 4 MG tablet Commonly known as: ZOFRAN Take 1 tablet (4 mg total) by mouth every 6 (six) hours as needed for nausea.   OneTouch Delica Lancets 70J Misc Use 4 (four) times daily   OneTouch Delica Plus [id number] Misc USE TO CHECK GLUCOSE 4 TIMES DAILY   OneTouch Verio Flex System w/Device Kit USE TO CHECK GLUCOSE 4 TIMES DAILY   oxyCODONE 5 MG immediate release tablet Commonly known as: Oxy IR/ROXICODONE Take 1-2 tablets (5-10 mg total) by mouth every 4 (four) hours as needed for moderate pain (pain score 4-6).    quinapril 20 MG tablet Commonly known as: ACCUPRIL Take 1 tablet (20 mg total) by mouth daily.   traMADol 50 MG tablet Commonly known as: ULTRAM Take 1 tablet (50 mg total) by mouth every 6 (six) hours as needed for moderate pain.   Vitamin B-12 2500 MCG Subl Place 2,500 mcg under the tongue daily.       ASK your doctor about these medications    Accu-Chek Aviva Plus test strip Generic drug: glucose blood USE TEST STRIP(S) TO CHECK GLUCOSE 4 TIMES DAILY   carvedilol 12.5 MG tablet Commonly known as: COREG Take 2 tablets by mouth twice daily   Euthyrox 150 MCG tablet Generic drug: levothyroxine TAKE 1 TABLET BY MOUTH ONCE  DAILY BEFORE BREAKFAST   FLUoxetine 20 MG capsule Commonly known as: PROZAC TAKE 1 CAPSULE BY MOUTH ONCE DAILY. MUST KEEP UPCOMING APPOINTMENT FOR FURTHER REFILLS.   Vitamin D (Ergocalciferol) 1.25 MG (50000 UNIT) Caps capsule Commonly known as: DRISDOL Take 1 capsule by mouth once a week               Durable Medical Equipment  (From admission, onward)           Start     Ordered   01/27/21 1510  DME Walker rolling  Once       Question:  Patient needs a walker to treat with the following condition  Answer:  Total knee replacement status   01/27/21 1509   01/27/21 1510  DME Bedside commode  Once       Question:  Patient needs a bedside commode to treat with the following condition  Answer:  Total knee replacement status   01/27/21 1509           Diagnostic Studies: DG Knee Right Port  Result Date: 01/27/2021 CLINICAL DATA:  Status post total right knee replacement. EXAM: PORTABLE RIGHT KNEE - 1-2 VIEW COMPARISON:  No prior. FINDINGS: Total right knee replacement. Hardware intact. Anatomic alignment. Peripheral vascular calcification. IMPRESSION: 1.  Total right knee replacement anatomic alignment. 2.  Peripheral vascular disease. Electronically Signed   By: Marcello Moores  Register   On: 01/27/2021 13:09    Disposition: Plan for discharge  home today pending BM and progress with PT.   Follow-up Information     Fausto Skillern, PA-C Follow up on 02/11/2021.   Specialty: Orthopedic Surgery Why: at 1:15pm Contact information: [place] [address] [phone]         Dereck Leep, MD Follow up on 03/11/2021.   Specialty: Orthopedic Surgery Why: at 2:45pm Contact information: [place] [address] [phone]                Signed: Judson Roch PA-C 01/29/2021, 8:48 AM

## 2021-01-29 NOTE — Progress Notes (Signed)
VSS, IV cath removed site was c/d/I. Discussed d/c packet with pt an daughter at the bedside, both verbalized understanding of the information provided. Scripts and extra dressings were placed in packet, placed ted hose on operative leg. Pt transported via w/c with all belongings, polar care and d/c packet down to daughters private car.

## 2021-01-29 NOTE — Progress Notes (Signed)
Physical Therapy Treatment Patient Details Name: Mary [name] MRN: [id number] DOB: 1946/06/22 Today's Date: 01/29/2021    History of Present Illness Pt admitted for R TKR and is POD 0 at time of evaluation. PMH to include:  Carpal tunnel syndrome, bilateral, Depression, peripheral neuropathy associated with type 1 diabetes mellitus, Diabetic retinal damage of both eyes, DM, diabetic retinopathy + macular edema s/p laser surgery, History of shingles, Hyperlipidemia, Hypertension, Hypothyroidism, Obesity and Osteopenia    PT Comments    Able to complete full lap around nursing station with RW, close sup;  cuing for walker position and gait mechanics, but overall safe for discharge home. Voiced comfort with stair training previous date; declined repeat efforts today. Verbalized understanding of HEP, mobility progression, use of RW, car transfer technique; comfortable with current level of assist and functional performance.  No further questions/concerns at this time; eager for upcoming discharge.   Follow Up Recommendations  Home health PT     Equipment Recommendations  None recommended by PT    Recommendations for Other Services       Precautions / Restrictions Precautions Precautions: Fall Restrictions Weight Bearing Restrictions: Yes RLE Weight Bearing: Weight bearing as tolerated    Mobility  Bed Mobility               General bed mobility comments: seated in recliner beginning/end of treatment session    Transfers Overall transfer level: Needs assistance Equipment used: Rolling walker (2 wheeled) Transfers: Sit to/from Stand Sit to Stand: Supervision         General transfer comment: good carry-over of hand placement, sequencing and overall safety  Ambulation/Gait Ambulation/Gait assistance: Supervision Gait Distance (Feet): 200 Feet Assistive device: Rolling walker (2 wheeled)   Gait velocity: 10' walk time, 12-13 seconds   General Gait Details: mixed  step to and step through gait pattern; limited stance time/loading phase on R LE, abrupt stepping and foot contact L LE.  Heavy WBing bilat LEs; min cuing for walker position and postural extension with gait efforts   Stairs             Wheelchair Mobility    Modified Rankin (Stroke Patients Only)       Balance Overall balance assessment: Needs assistance Sitting-balance support: No upper extremity supported;Feet supported Sitting balance-Leahy Scale: Normal     Standing balance support: Bilateral upper extremity supported Standing balance-Leahy Scale: Fair                              Cognition Arousal/Alertness: Awake/alert Behavior During Therapy: WFL for tasks assessed/performed Overall Cognitive Status: Within Functional Limits for tasks assessed                                        Exercises Total Joint Exercises Goniometric ROM: R knee: 0-97 degrees Other Exercises Other Exercises: Toilet transfer, ambulatory with Rw, close sup; min assist from lower (standard toilet) surface height. Other Exercises: Verbally reviewed technique for car transfer; independently voiced technique.    General Comments        Pertinent Vitals/Pain Pain Assessment: 0-10 Pain Score: 4  Pain Location: R knee Pain Descriptors / Indicators: Discomfort;Sore Pain Intervention(s): Limited activity within patient's tolerance;Monitored during session;Premedicated before session;Repositioned    Home Living  Prior Function            PT Goals (current goals can now be found in the care plan section) Acute Rehab PT Goals Patient Stated Goal: to go home PT Goal Formulation: With patient Time For Goal Achievement: 02/10/21 Potential to Achieve Goals: Good Progress towards PT goals: Progressing toward goals    Frequency    BID      PT Plan Current plan remains appropriate    Co-evaluation               AM-PAC PT "6 Clicks" Mobility   Outcome Measure  Help needed turning from your back to your side while in a flat bed without using bedrails?: None Help needed moving from lying on your back to sitting on the side of a flat bed without using bedrails?: None Help needed moving to and from a bed to a chair (including a wheelchair)?: None Help needed standing up from a chair using your arms (e.g., wheelchair or bedside chair)?: None Help needed to walk in hospital room?: None Help needed climbing 3-5 steps with a railing? : A Little 6 Click Score: 23    End of Session Equipment Utilized During Treatment: Gait belt Activity Tolerance: Patient tolerated treatment well Patient left: in chair;with call bell/phone within reach;with chair alarm set Nurse Communication: Mobility status PT Visit Diagnosis: Difficulty in walking, not elsewhere classified (R26.2);Pain;Muscle weakness (generalized) (M62.81);Other abnormalities of gait and mobility (R26.89) Pain - Right/Left: Right Pain - part of body: Knee     Time: 5188-4166 PT Time Calculation (min) (ACUTE ONLY): 25 min  Charges:  $Gait Training: 8-22 mins $Therapeutic Activity: 8-22 mins                     Faolan [place] H. Manson Passey, PT, DPT, NCS 01/29/21, 1:41 PM [phone]

## 2021-01-30 DIAGNOSIS — E1142 Type 2 diabetes mellitus with diabetic polyneuropathy: Secondary | ICD-10-CM | POA: Diagnosis not present

## 2021-01-30 DIAGNOSIS — E669 Obesity, unspecified: Secondary | ICD-10-CM | POA: Diagnosis not present

## 2021-01-30 DIAGNOSIS — F40240 Claustrophobia: Secondary | ICD-10-CM | POA: Diagnosis not present

## 2021-01-30 DIAGNOSIS — E1122 Type 2 diabetes mellitus with diabetic chronic kidney disease: Secondary | ICD-10-CM | POA: Diagnosis not present

## 2021-01-30 DIAGNOSIS — E113513 Type 2 diabetes mellitus with proliferative diabetic retinopathy with macular edema, bilateral: Secondary | ICD-10-CM | POA: Diagnosis not present

## 2021-01-30 DIAGNOSIS — I129 Hypertensive chronic kidney disease with stage 1 through stage 4 chronic kidney disease, or unspecified chronic kidney disease: Secondary | ICD-10-CM | POA: Diagnosis not present

## 2021-01-30 DIAGNOSIS — N2581 Secondary hyperparathyroidism of renal origin: Secondary | ICD-10-CM | POA: Diagnosis not present

## 2021-01-30 DIAGNOSIS — E039 Hypothyroidism, unspecified: Secondary | ICD-10-CM | POA: Diagnosis not present

## 2021-01-30 DIAGNOSIS — G5603 Carpal tunnel syndrome, bilateral upper limbs: Secondary | ICD-10-CM | POA: Diagnosis not present

## 2021-01-30 DIAGNOSIS — N1832 Chronic kidney disease, stage 3b: Secondary | ICD-10-CM | POA: Diagnosis not present

## 2021-01-30 DIAGNOSIS — Z471 Aftercare following joint replacement surgery: Secondary | ICD-10-CM | POA: Diagnosis not present

## 2021-01-30 DIAGNOSIS — Z794 Long term (current) use of insulin: Secondary | ICD-10-CM | POA: Diagnosis not present

## 2021-01-30 DIAGNOSIS — M8580 Other specified disorders of bone density and structure, unspecified site: Secondary | ICD-10-CM | POA: Diagnosis not present

## 2021-01-30 DIAGNOSIS — F329 Major depressive disorder, single episode, unspecified: Secondary | ICD-10-CM | POA: Diagnosis not present

## 2021-01-30 DIAGNOSIS — E785 Hyperlipidemia, unspecified: Secondary | ICD-10-CM | POA: Diagnosis not present

## 2021-01-30 DIAGNOSIS — Z96651 Presence of right artificial knee joint: Secondary | ICD-10-CM | POA: Diagnosis not present

## 2021-01-31 ENCOUNTER — Other Ambulatory Visit: Payer: Self-pay | Admitting: Family Medicine

## 2021-01-31 DIAGNOSIS — F329 Major depressive disorder, single episode, unspecified: Secondary | ICD-10-CM

## 2021-01-31 DIAGNOSIS — I10 Essential (primary) hypertension: Secondary | ICD-10-CM

## 2021-01-31 NOTE — Telephone Encounter (Signed)
Requested medication (s) are due for refill today: expired medication  Requested medication (s) are on the active medication list: yes  Last refill:  01/01/20 #90 3 refills  Future visit scheduled: yes in 11 months   Notes to clinic:  expired medication     Requested Prescriptions  Pending Prescriptions Disp Refills   quinapril (ACCUPRIL) 20 MG tablet [Pharmacy Med Name: Quinapril HCl 20 MG Oral Tablet] 90 tablet 0    Sig: Take 1 tablet by mouth once daily      Cardiovascular:  ACE Inhibitors Failed - 01/31/2021  3:33 PM      Failed - Cr in normal range and within 180 days    Creatinine  Date Value Ref Range Status  07/11/2012 1.19 0.60 - 1.30 mg/dL Final   Creatinine, Ser  Date Value Ref Range Status  01/15/2021 1.26 (H) 0.44 - 1.00 mg/dL Final          Failed - Last BP in normal range    BP Readings from Last 1 Encounters:  01/29/21 (!) 146/91          Passed - K in normal range and within 180 days    Potassium  Date Value Ref Range Status  01/15/2021 4.0 3.5 - 5.1 mmol/L Final  07/11/2012 4.4 3.5 - 5.1 mmol/L Final          Passed - Patient is not pregnant      Passed - Valid encounter within last 6 months    Recent Outpatient Visits           2 weeks ago Encounter for [employer] annual wellness exam   [facility] Maple Hudson., MD   4 months ago Uncontrolled type 1 diabetes mellitus with hyperglycemia [facility])   [facility] Maple Hudson., MD   10 months ago Essential hypertension   [facility] Maple Hudson., MD   1 year ago Annual physical exam   [facility] Maple Hudson., MD   1 year ago Essential hypertension   [facility] Maple Hudson., MD       Future Appointments             In 11 months Maple Hudson., MD [facility]              Signed Prescriptions Disp Refills   FLUoxetine (PROZAC) 20 MG  capsule 90 capsule 0    Sig: TAKE 1 CAPSULE BY MOUTH ONCE DAILY . APPOINTMENT REQUIRED FOR FUTURE REFILLS      Psychiatry:  Antidepressants - SSRI Passed - 01/31/2021  3:33 PM      Passed - Completed PHQ-2 or PHQ-9 in the last 360 days      Passed - Valid encounter within last 6 months    Recent Outpatient Visits           2 weeks ago Encounter for Medicare annual wellness exam   [facility] Maple Hudson., MD   4 months ago Uncontrolled type 1 diabetes mellitus with hyperglycemia [facility])   [facility] Maple Hudson., MD   10 months ago Essential hypertension   [facility] Maple Hudson., MD   1 year ago Annual physical exam   [facility] Maple Hudson., MD   1 year ago Essential hypertension   [facility] Maple Hudson., MD  Future Appointments             In 11 months Maple Hudson., MD [facility]

## 2021-01-31 NOTE — Telephone Encounter (Signed)
Future visit in 11 months 

## 2021-02-06 ENCOUNTER — Other Ambulatory Visit: Payer: Self-pay | Admitting: Family Medicine

## 2021-02-06 DIAGNOSIS — F329 Major depressive disorder, single episode, unspecified: Secondary | ICD-10-CM

## 2021-02-06 NOTE — Telephone Encounter (Signed)
Requested medication (s) are due for refill today: yes  Requested medication (s) are on the active medication list: yes  Last refill:  05/25/20 #12 3 RF  Future visit scheduled: yes  Notes to clinic:  med not delegated to NT to RF   Requested Prescriptions  Pending Prescriptions Disp Refills   Vitamin D, Ergocalciferol, (DRISDOL) 1.25 MG (50000 UNIT) CAPS capsule [Pharmacy Med Name: VITAMIN D2 (ERGO) 1.25MG   CAP] 12 capsule     Sig: Take 1 capsule by mouth once a week      Endocrinology:  Vitamins - Vitamin D Supplementation Failed - 02/06/2021  1:31 PM      Failed - 50,000 IU strengths are not delegated      Failed - Phosphate in normal range and within 360 days    No results found for: PHOS        Failed - Vitamin D in normal range and within 360 days    Vit D, 25-Hydroxy  Date Value Ref Range Status  12/05/2016 19.0 (L) 30.0 - 100.0 ng/mL Final    Comment:    Vitamin D deficiency has been defined by the Institute of Medicine and an Endocrine Society practice guideline as a level of serum 25-OH vitamin D less than 20 ng/mL (1,2). The Endocrine Society went on to further define vitamin D insufficiency as a level between 21 and 29 ng/mL (2). 1. IOM (Institute of Medicine). 2010. Dietary reference    intakes for calcium and D. [place]: The    [employer]. 2. Holick MF, Binkley [employer], Bischoff-Ferrari HA, et al.    Evaluation, treatment, and prevention of vitamin D    deficiency: an Endocrine Society clinical practice    guideline. JCEM. 2011 Jul; 96(7):1911-30.           Passed - Ca in normal range and within 360 days    Calcium  Date Value Ref Range Status  01/15/2021 9.5 8.9 - 10.3 mg/dL Final   Calcium, Total  Date Value Ref Range Status  07/11/2012 9.8 8.5 - 10.1 mg/dL Final          Passed - Valid encounter within last 12 months    Recent Outpatient Visits           3 weeks ago Encounter for [employer] annual wellness exam   [facility] Maple Hudson., MD   4 months ago Uncontrolled type 1 diabetes mellitus with hyperglycemia [facility])   [facility] Maple Hudson., MD   10 months ago Essential hypertension   [facility] Maple Hudson., MD   1 year ago Annual physical exam   [facility] Maple Hudson., MD   1 year ago Essential hypertension   [facility] Maple Hudson., MD       Future Appointments             In 11 months Maple Hudson., MD [facility]              Refused Prescriptions Disp Refills   FLUoxetine (PROZAC) 20 MG capsule [Pharmacy Med Name: FLUoxetine HCl 20 MG Oral Capsule] 90 capsule     Sig: TAKE 1 CAPSULE BY MOUTH ONCE DAILY . APPOINTMENT REQUIRED FOR FUTURE REFILLS      Psychiatry:  Antidepressants - SSRI Passed - 02/06/2021  1:31 PM      Passed - Completed PHQ-2 or PHQ-9 in the last  360 days      Passed - Valid encounter within last 6 months    Recent Outpatient Visits           3 weeks ago Encounter for [employer] annual wellness exam   [facility] Maple Hudson., MD   4 months ago Uncontrolled type 1 diabetes mellitus with hyperglycemia [facility])   [facility] Maple Hudson., MD   10 months ago Essential hypertension   [facility] Maple Hudson., MD   1 year ago Annual physical exam   [facility] Maple Hudson., MD   1 year ago Essential hypertension   [facility] Maple Hudson., MD       Future Appointments             In 11 months Maple Hudson., MD [facility]

## 2021-02-06 NOTE — Telephone Encounter (Signed)
Requested Prescriptions  Pending Prescriptions Disp Refills  . Vitamin D, Ergocalciferol, (DRISDOL) 1.25 MG (50000 UNIT) CAPS capsule [Pharmacy Med Name: VITAMIN D2 (ERGO) 1.25MG   CAP] 12 capsule 0    Sig: Take 1 capsule by mouth once a week     Endocrinology:  Vitamins - Vitamin D Supplementation Failed - 02/06/2021  1:31 PM      Failed - 50,000 IU strengths are not delegated      Failed - Phosphate in normal range and within 360 days    No results found for: PHOS       Failed - Vitamin D in normal range and within 360 days    Vit D, 25-Hydroxy  Date Value Ref Range Status  12/05/2016 19.0 (L) 30.0 - 100.0 ng/mL Final    Comment:    Vitamin D deficiency has been defined by the Institute of Medicine and an Endocrine Society practice guideline as a level of serum 25-OH vitamin D less than 20 ng/mL (1,2). The Endocrine Society went on to further define vitamin D insufficiency as a level between 21 and 29 ng/mL (2). 1. IOM (Institute of Medicine). 2010. Dietary reference    intakes for calcium and D. [place]: The    [employer]. 2. Holick MF, Binkley [place], Bischoff-Ferrari HA, et al.    Evaluation, treatment, and prevention of vitamin D    deficiency: an Endocrine Society clinical practice    guideline. JCEM. 2011 Jul; 96(7):1911-30.          Passed - Ca in normal range and within 360 days    Calcium  Date Value Ref Range Status  01/15/2021 9.5 8.9 - 10.3 mg/dL Final   Calcium, Total  Date Value Ref Range Status  07/11/2012 9.8 8.5 - 10.1 mg/dL Final         Passed - Valid encounter within last 12 months    Recent Outpatient Visits          3 weeks ago Encounter for [employer] annual wellness exam   [facility] Maple Hudson., MD   4 months ago Uncontrolled type 1 diabetes mellitus with hyperglycemia [facility])   [facility] Maple Hudson., MD   10 months ago Essential hypertension   [facility]  Maple Hudson., MD   1 year ago Annual physical exam   [facility] Maple Hudson., MD   1 year ago Essential hypertension   [facility] Maple Hudson., MD      Future Appointments            In 11 months Maple Hudson., MD [facility]           . FLUoxetine (PROZAC) 20 MG capsule [Pharmacy Med Name: FLUoxetine HCl 20 MG Oral Capsule] 90 capsule     Sig: TAKE 1 CAPSULE BY MOUTH ONCE DAILY . APPOINTMENT REQUIRED FOR FUTURE REFILLS     Psychiatry:  Antidepressants - SSRI Passed - 02/06/2021  1:31 PM      Passed - Completed PHQ-2 or PHQ-9 in the last 360 days      Passed - Valid encounter within last 6 months    Recent Outpatient Visits          3 weeks ago Encounter for [employer] annual wellness exam   [facility] Maple Hudson., MD   4 months ago Uncontrolled type 1 diabetes mellitus with hyperglycemia (HCC)  [facility] Maple Hudson., MD   10 months ago Essential hypertension   [facility] Maple Hudson., MD   1 year ago Annual physical exam   [facility] Maple Hudson., MD   1 year ago Essential hypertension   [facility] Maple Hudson., MD      Future Appointments            In 11 months Maple Hudson., MD [facility]

## 2021-02-11 DIAGNOSIS — M25561 Pain in right knee: Secondary | ICD-10-CM | POA: Diagnosis not present

## 2021-02-11 DIAGNOSIS — M6281 Muscle weakness (generalized): Secondary | ICD-10-CM | POA: Diagnosis not present

## 2021-02-11 DIAGNOSIS — Z96651 Presence of right artificial knee joint: Secondary | ICD-10-CM | POA: Diagnosis not present

## 2021-02-11 DIAGNOSIS — M25661 Stiffness of right knee, not elsewhere classified: Secondary | ICD-10-CM | POA: Diagnosis not present

## 2021-02-14 DIAGNOSIS — E1065 Type 1 diabetes mellitus with hyperglycemia: Secondary | ICD-10-CM | POA: Diagnosis not present

## 2021-02-16 DIAGNOSIS — M25661 Stiffness of right knee, not elsewhere classified: Secondary | ICD-10-CM | POA: Diagnosis not present

## 2021-02-16 DIAGNOSIS — Z96651 Presence of right artificial knee joint: Secondary | ICD-10-CM | POA: Diagnosis not present

## 2021-02-16 DIAGNOSIS — M6281 Muscle weakness (generalized): Secondary | ICD-10-CM | POA: Diagnosis not present

## 2021-02-16 DIAGNOSIS — M25561 Pain in right knee: Secondary | ICD-10-CM | POA: Diagnosis not present

## 2021-02-18 DIAGNOSIS — Z96651 Presence of right artificial knee joint: Secondary | ICD-10-CM | POA: Diagnosis not present

## 2021-02-18 DIAGNOSIS — M6281 Muscle weakness (generalized): Secondary | ICD-10-CM | POA: Diagnosis not present

## 2021-02-18 DIAGNOSIS — M25661 Stiffness of right knee, not elsewhere classified: Secondary | ICD-10-CM | POA: Diagnosis not present

## 2021-02-18 DIAGNOSIS — M25561 Pain in right knee: Secondary | ICD-10-CM | POA: Diagnosis not present

## 2021-02-23 DIAGNOSIS — M25561 Pain in right knee: Secondary | ICD-10-CM | POA: Diagnosis not present

## 2021-02-23 DIAGNOSIS — M6281 Muscle weakness (generalized): Secondary | ICD-10-CM | POA: Diagnosis not present

## 2021-02-23 DIAGNOSIS — M25661 Stiffness of right knee, not elsewhere classified: Secondary | ICD-10-CM | POA: Diagnosis not present

## 2021-02-23 DIAGNOSIS — Z96651 Presence of right artificial knee joint: Secondary | ICD-10-CM | POA: Diagnosis not present

## 2021-02-25 DIAGNOSIS — Z96651 Presence of right artificial knee joint: Secondary | ICD-10-CM | POA: Diagnosis not present

## 2021-03-02 DIAGNOSIS — Z96651 Presence of right artificial knee joint: Secondary | ICD-10-CM | POA: Diagnosis not present

## 2021-03-02 DIAGNOSIS — M25561 Pain in right knee: Secondary | ICD-10-CM | POA: Diagnosis not present

## 2021-03-04 DIAGNOSIS — M25561 Pain in right knee: Secondary | ICD-10-CM | POA: Diagnosis not present

## 2021-03-04 DIAGNOSIS — Z96651 Presence of right artificial knee joint: Secondary | ICD-10-CM | POA: Diagnosis not present

## 2021-03-05 ENCOUNTER — Other Ambulatory Visit: Payer: Self-pay | Admitting: Family Medicine

## 2021-03-05 DIAGNOSIS — E7849 Other hyperlipidemia: Secondary | ICD-10-CM

## 2021-03-11 DIAGNOSIS — Z96651 Presence of right artificial knee joint: Secondary | ICD-10-CM | POA: Diagnosis not present

## 2021-03-14 DIAGNOSIS — Z96651 Presence of right artificial knee joint: Secondary | ICD-10-CM | POA: Insufficient documentation

## 2021-03-16 DIAGNOSIS — E1065 Type 1 diabetes mellitus with hyperglycemia: Secondary | ICD-10-CM | POA: Diagnosis not present

## 2021-03-24 ENCOUNTER — Other Ambulatory Visit: Payer: Self-pay | Admitting: Family Medicine

## 2021-03-26 DIAGNOSIS — Z471 Aftercare following joint replacement surgery: Secondary | ICD-10-CM | POA: Diagnosis not present

## 2021-03-30 ENCOUNTER — Other Ambulatory Visit: Payer: Self-pay | Admitting: Family Medicine

## 2021-03-30 DIAGNOSIS — I129 Hypertensive chronic kidney disease with stage 1 through stage 4 chronic kidney disease, or unspecified chronic kidney disease: Secondary | ICD-10-CM | POA: Diagnosis not present

## 2021-03-30 DIAGNOSIS — E875 Hyperkalemia: Secondary | ICD-10-CM | POA: Diagnosis not present

## 2021-03-30 DIAGNOSIS — R809 Proteinuria, unspecified: Secondary | ICD-10-CM | POA: Diagnosis not present

## 2021-03-30 DIAGNOSIS — M7989 Other specified soft tissue disorders: Secondary | ICD-10-CM

## 2021-03-30 DIAGNOSIS — N1832 Chronic kidney disease, stage 3b: Secondary | ICD-10-CM | POA: Diagnosis not present

## 2021-04-06 DIAGNOSIS — E1022 Type 1 diabetes mellitus with diabetic chronic kidney disease: Secondary | ICD-10-CM | POA: Diagnosis not present

## 2021-04-06 DIAGNOSIS — N183 Chronic kidney disease, stage 3 unspecified: Secondary | ICD-10-CM | POA: Diagnosis not present

## 2021-04-13 DIAGNOSIS — E1042 Type 1 diabetes mellitus with diabetic polyneuropathy: Secondary | ICD-10-CM | POA: Diagnosis not present

## 2021-04-13 DIAGNOSIS — E103313 Type 1 diabetes mellitus with moderate nonproliferative diabetic retinopathy with macular edema, bilateral: Secondary | ICD-10-CM | POA: Diagnosis not present

## 2021-04-13 DIAGNOSIS — E1022 Type 1 diabetes mellitus with diabetic chronic kidney disease: Secondary | ICD-10-CM | POA: Diagnosis not present

## 2021-04-13 DIAGNOSIS — N183 Chronic kidney disease, stage 3 unspecified: Secondary | ICD-10-CM | POA: Diagnosis not present

## 2021-04-15 DIAGNOSIS — E1065 Type 1 diabetes mellitus with hyperglycemia: Secondary | ICD-10-CM | POA: Diagnosis not present

## 2021-05-15 DIAGNOSIS — E1065 Type 1 diabetes mellitus with hyperglycemia: Secondary | ICD-10-CM | POA: Diagnosis not present

## 2021-05-26 ENCOUNTER — Ambulatory Visit: Payer: Medicare Other

## 2021-06-20 ENCOUNTER — Other Ambulatory Visit: Payer: Self-pay | Admitting: Family Medicine

## 2021-06-20 DIAGNOSIS — E7849 Other hyperlipidemia: Secondary | ICD-10-CM

## 2021-06-20 DIAGNOSIS — F329 Major depressive disorder, single episode, unspecified: Secondary | ICD-10-CM

## 2021-06-21 DIAGNOSIS — E1065 Type 1 diabetes mellitus with hyperglycemia: Secondary | ICD-10-CM | POA: Diagnosis not present

## 2021-06-29 ENCOUNTER — Telehealth: Payer: Self-pay

## 2021-06-29 DIAGNOSIS — E039 Hypothyroidism, unspecified: Secondary | ICD-10-CM

## 2021-06-29 MED ORDER — LEVOTHYROXINE SODIUM 150 MCG PO TABS: 150.0000 ug | ORAL_TABLET | Freq: Every day | ORAL | 3 refills | Status: DC

## 2021-06-29 NOTE — Telephone Encounter (Signed)
Pharmacy requesting 90 day supply refill and manufacturer change.   EUTHYROX 150 MCG tablet [[id number]]    Order Details Dose, Route, Frequency: As Directed  Dispense Quantity: 90 tablet Refills: 3        Sig: TAKE 1 TABLET BY MOUTH ONCE DAILY BEFORE BREAKFAST

## 2021-07-21 DIAGNOSIS — E1065 Type 1 diabetes mellitus with hyperglycemia: Secondary | ICD-10-CM | POA: Diagnosis not present

## 2021-08-20 DIAGNOSIS — E1065 Type 1 diabetes mellitus with hyperglycemia: Secondary | ICD-10-CM | POA: Diagnosis not present

## 2021-09-01 ENCOUNTER — Encounter: Payer: Self-pay | Admitting: Family Medicine

## 2021-09-02 ENCOUNTER — Other Ambulatory Visit: Payer: Self-pay | Admitting: *Deleted

## 2021-09-02 DIAGNOSIS — I10 Essential (primary) hypertension: Secondary | ICD-10-CM

## 2021-09-02 MED ORDER — QUINAPRIL HCL 20 MG PO TABS
20.0000 mg | ORAL_TABLET | Freq: Every day | ORAL | 1 refills | Status: DC
Start: 2021-09-02 — End: 2021-10-13

## 2021-09-04 IMAGING — DX DG KNEE 1-2V PORT*R*
2 series · 2 of 2 positions shown · non-contrast
Comparison: No prior.

CLINICAL DATA: Status post total right knee replacement.

EXAM:
PORTABLE RIGHT KNEE - 1-2 VIEW

[knee ap]
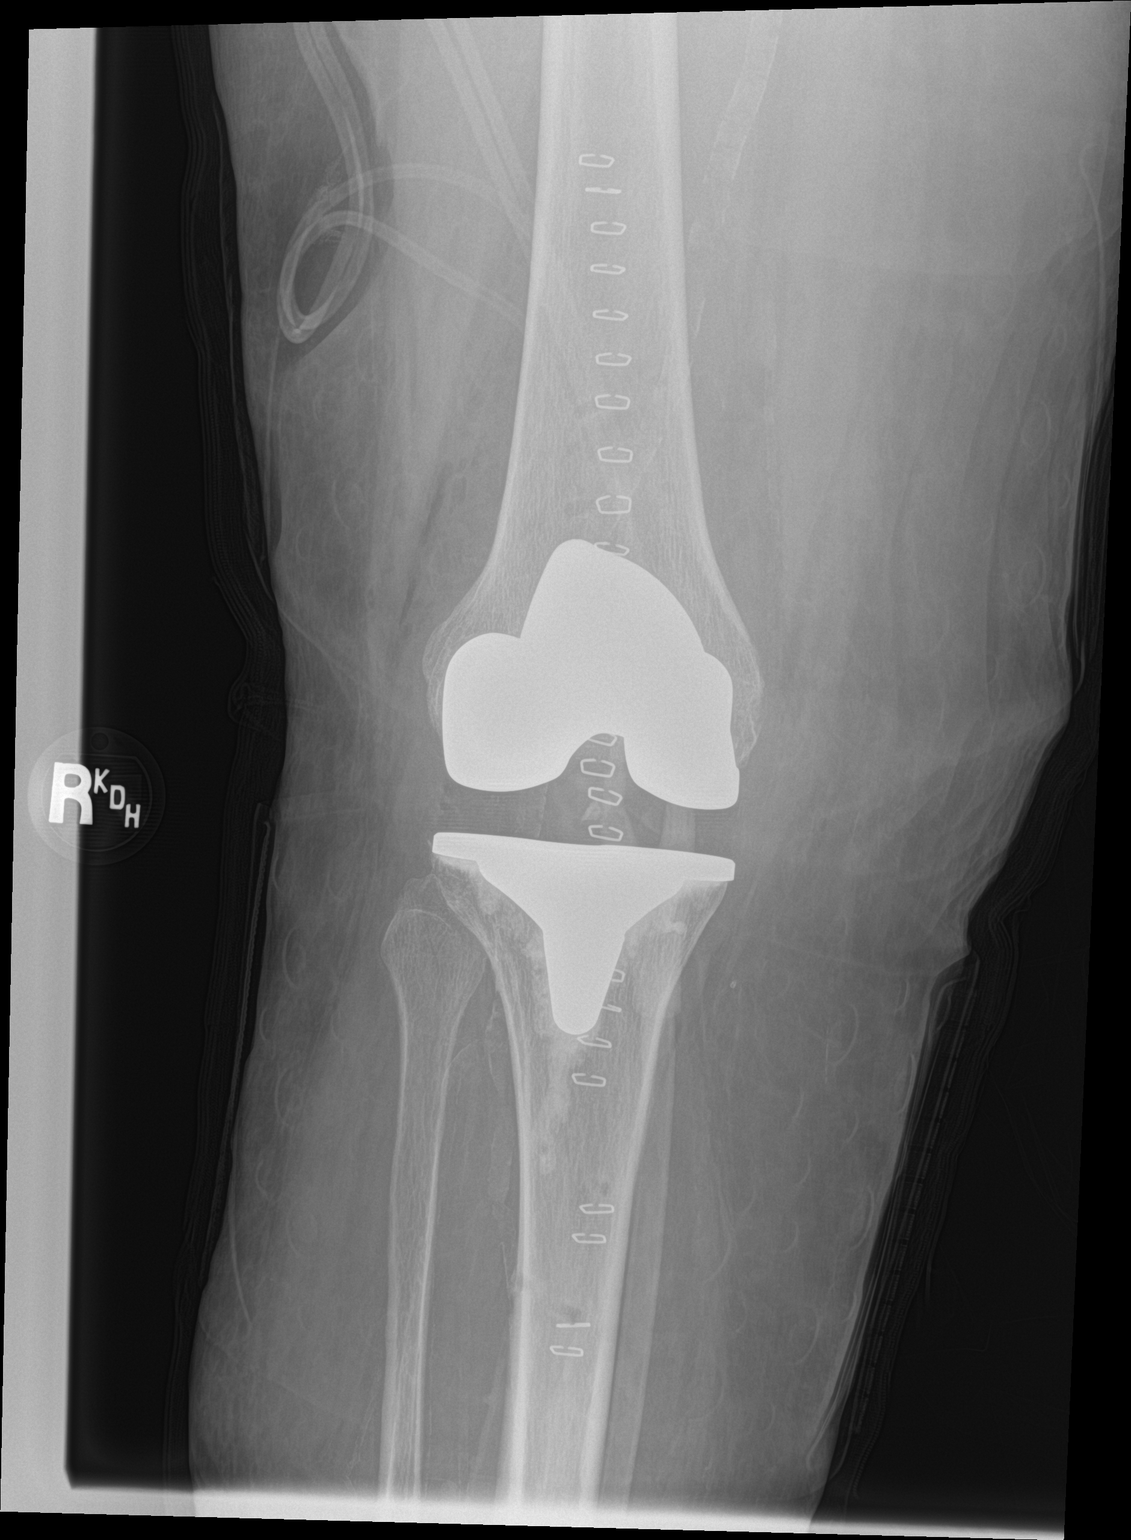

[knee lat]
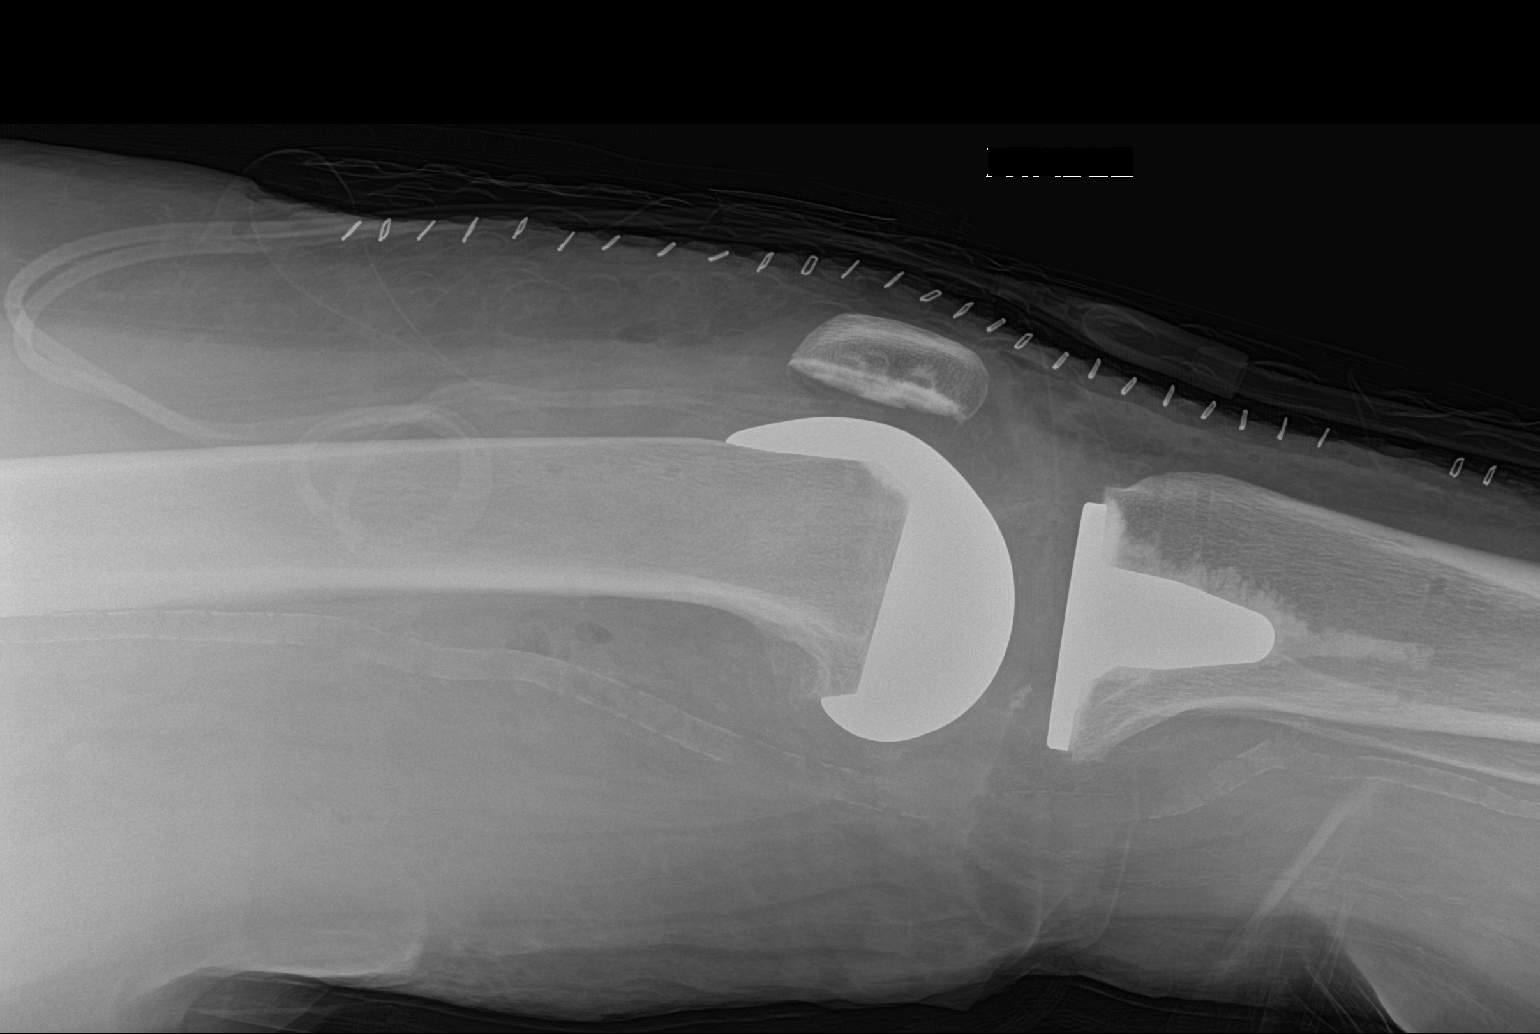

[2 of 2 positions shown; findings below may reference images not displayed]

FINDINGS: Total right knee replacement. Hardware intact. Anatomic alignment.
Peripheral vascular calcification.
IMPRESSION: 1.  Total right knee replacement anatomic alignment.

2.  Peripheral vascular disease.

## 2021-09-09 ENCOUNTER — Encounter: Payer: Self-pay | Admitting: Family Medicine

## 2021-09-10 ENCOUNTER — Other Ambulatory Visit: Payer: Self-pay | Admitting: *Deleted

## 2021-09-10 DIAGNOSIS — E7849 Other hyperlipidemia: Secondary | ICD-10-CM

## 2021-09-10 DIAGNOSIS — M7989 Other specified soft tissue disorders: Secondary | ICD-10-CM

## 2021-09-10 DIAGNOSIS — F329 Major depressive disorder, single episode, unspecified: Secondary | ICD-10-CM

## 2021-09-10 DIAGNOSIS — E039 Hypothyroidism, unspecified: Secondary | ICD-10-CM

## 2021-09-10 MED ORDER — LEVOTHYROXINE SODIUM 150 MCG PO TABS: 150.0000 ug | ORAL_TABLET | Freq: Every day | ORAL | 3 refills | Status: AC

## 2021-09-10 MED ORDER — CARVEDILOL 12.5 MG PO TABS: 25.0000 mg | ORAL_TABLET | Freq: Two times a day (BID) | ORAL | 3 refills | Status: DC

## 2021-09-10 MED ORDER — FUROSEMIDE 20 MG PO TABS: 20.0000 mg | ORAL_TABLET | Freq: Every day | ORAL | 3 refills | Status: DC

## 2021-09-10 MED ORDER — ATORVASTATIN CALCIUM 10 MG PO TABS: 10.0000 mg | ORAL_TABLET | Freq: Every day | ORAL | 3 refills | Status: AC

## 2021-09-10 MED ORDER — FLUOXETINE HCL 20 MG PO CAPS: ORAL_CAPSULE | ORAL | 3 refills | Status: DC

## 2021-09-19 DIAGNOSIS — E1065 Type 1 diabetes mellitus with hyperglycemia: Secondary | ICD-10-CM | POA: Diagnosis not present

## 2021-09-23 DIAGNOSIS — N1832 Chronic kidney disease, stage 3b: Secondary | ICD-10-CM | POA: Diagnosis not present

## 2021-09-23 DIAGNOSIS — R809 Proteinuria, unspecified: Secondary | ICD-10-CM | POA: Diagnosis not present

## 2021-09-23 DIAGNOSIS — E875 Hyperkalemia: Secondary | ICD-10-CM | POA: Diagnosis not present

## 2021-09-23 DIAGNOSIS — I129 Hypertensive chronic kidney disease with stage 1 through stage 4 chronic kidney disease, or unspecified chronic kidney disease: Secondary | ICD-10-CM | POA: Diagnosis not present

## 2021-09-28 DIAGNOSIS — E1122 Type 2 diabetes mellitus with diabetic chronic kidney disease: Secondary | ICD-10-CM | POA: Diagnosis not present

## 2021-09-28 DIAGNOSIS — I10 Essential (primary) hypertension: Secondary | ICD-10-CM | POA: Diagnosis not present

## 2021-09-28 DIAGNOSIS — N1832 Chronic kidney disease, stage 3b: Secondary | ICD-10-CM | POA: Diagnosis not present

## 2021-09-28 DIAGNOSIS — N2581 Secondary hyperparathyroidism of renal origin: Secondary | ICD-10-CM | POA: Diagnosis not present

## 2021-10-05 ENCOUNTER — Encounter: Payer: Self-pay | Admitting: Family Medicine

## 2021-10-06 ENCOUNTER — Encounter: Payer: Self-pay | Admitting: Family Medicine

## 2021-10-06 MED ORDER — VITAMIN D (ERGOCALCIFEROL) 1.25 MG (50000 UNIT) PO CAPS: 50000.0000 [IU] | ORAL_CAPSULE | ORAL | 3 refills | Status: AC

## 2021-10-07 DIAGNOSIS — E103313 Type 1 diabetes mellitus with moderate nonproliferative diabetic retinopathy with macular edema, bilateral: Secondary | ICD-10-CM | POA: Diagnosis not present

## 2021-10-12 ENCOUNTER — Encounter: Payer: Self-pay | Admitting: Family Medicine

## 2021-10-13 ENCOUNTER — Other Ambulatory Visit: Payer: Self-pay | Admitting: *Deleted

## 2021-10-13 DIAGNOSIS — I10 Essential (primary) hypertension: Secondary | ICD-10-CM

## 2021-10-13 MED ORDER — RAMIPRIL 10 MG PO CAPS: 10.0000 mg | ORAL_CAPSULE | Freq: Every day | ORAL | 1 refills | Status: DC

## 2021-10-18 DIAGNOSIS — D3132 Benign neoplasm of left choroid: Secondary | ICD-10-CM | POA: Diagnosis not present

## 2021-10-18 LAB — HM DIABETES EYE EXAM

## 2021-10-19 DIAGNOSIS — E1065 Type 1 diabetes mellitus with hyperglycemia: Secondary | ICD-10-CM | POA: Diagnosis not present

## 2021-10-21 DIAGNOSIS — E103313 Type 1 diabetes mellitus with moderate nonproliferative diabetic retinopathy with macular edema, bilateral: Secondary | ICD-10-CM | POA: Diagnosis not present

## 2021-10-21 DIAGNOSIS — E1042 Type 1 diabetes mellitus with diabetic polyneuropathy: Secondary | ICD-10-CM | POA: Diagnosis not present

## 2021-10-21 DIAGNOSIS — E1022 Type 1 diabetes mellitus with diabetic chronic kidney disease: Secondary | ICD-10-CM | POA: Diagnosis not present

## 2021-10-21 DIAGNOSIS — N183 Chronic kidney disease, stage 3 unspecified: Secondary | ICD-10-CM | POA: Diagnosis not present

## 2021-11-18 DIAGNOSIS — E1065 Type 1 diabetes mellitus with hyperglycemia: Secondary | ICD-10-CM | POA: Diagnosis not present

## 2021-12-20 DIAGNOSIS — E1065 Type 1 diabetes mellitus with hyperglycemia: Secondary | ICD-10-CM | POA: Diagnosis not present

## 2022-01-12 DIAGNOSIS — Z6836 Body mass index (BMI) 36.0-36.9, adult: Secondary | ICD-10-CM | POA: Diagnosis not present

## 2022-01-12 DIAGNOSIS — G5603 Carpal tunnel syndrome, bilateral upper limbs: Secondary | ICD-10-CM | POA: Diagnosis not present

## 2022-01-13 ENCOUNTER — Ambulatory Visit (INDEPENDENT_AMBULATORY_CARE_PROVIDER_SITE_OTHER): Payer: Medicare Other

## 2022-01-13 VITALS — Wt 213.0 lb

## 2022-01-13 DIAGNOSIS — Z0000 Encounter for general adult medical examination without abnormal findings: Secondary | ICD-10-CM | POA: Diagnosis not present

## 2022-01-13 NOTE — Progress Notes (Signed)
Virtual Visit via Telephone Note  I connected with  Mary [name] on 01/13/22 at 11:00 AM EDT by telephone and verified that I am speaking with the correct person using two identifiers.  Location: Patient: home Provider: [facility] Persons participating in the virtual visit: [place]   I discussed the limitations, risks, security and privacy concerns of performing an evaluation and management service by telephone and the availability of in person appointments. The patient expressed understanding and agreed to proceed.  Interactive audio and video telecommunications were attempted between this nurse and patient, however failed, due to patient having technical difficulties OR patient did not have access to video capability.  We continued and completed visit with audio only.  Some vital signs may be absent or patient reported.   Dionisio David, LPN  Subjective:   Mary [name] is a 76 y.o. female who presents for Medicare Annual (Subsequent) preventive examination.  Review of Systems     Cardiac Risk Factors include: advanced age (>70mn, >>27women);diabetes mellitus;dyslipidemia     Objective:    There were no vitals filed for this visit. There is no height or weight on file to calculate BMI.     01/13/2022   11:02 AM 01/27/2021    7:36 AM 01/15/2021    1:13 PM 01/01/2020    1:37 PM 12/31/2018    2:24 PM 09/17/2018    6:00 PM 09/05/2018   10:17 AM  Advanced Directives  Does Patient Have a Medical Advance Directive? No No No No Yes No No  Type of APersonnel officerLiving will    Copy of HMarreroin Chart?     No - copy requested    Would patient like information on creating a medical advance directive? No - Patient declined No - Patient declined Yes (MAU/Ambulatory/Procedural Areas - Information given) No - Patient declined  Yes (Inpatient - patient requests chaplain consult to create a medical advance directive) Yes  (MAU/Ambulatory/Procedural Areas - Information given)    Current Medications (verified) Outpatient Encounter Medications as of 01/13/2022  Medication Sig   ACCU-CHEK AVIVA PLUS test strip USE TEST STRIP(S) TO CHECK GLUCOSE 4 TIMES DAILY   acetaminophen (TYLENOL) 650 MG CR tablet Take 1,300 mg by mouth every 8 (eight) hours as needed for pain.   atorvastatin (LIPITOR) 10 MG tablet Take 1 tablet (10 mg total) by mouth daily.   Blood Glucose Monitoring Suppl (ONETOUCH VERIO FLEX SYSTEM) w/Device KIT USE TO CHECK GLUCOSE 4 TIMES DAILY   carvedilol (COREG) 12.5 MG tablet Take 2 tablets (25 mg total) by mouth 2 (two) times daily.   Cyanocobalamin (VITAMIN B-12) 2500 MCG SUBL Place 2,500 mcg under the tongue daily.   diclofenac Sodium (VOLTAREN) 1 % GEL Apply 2 g topically 4 (four) times daily as needed (pain).   empagliflozin (JARDIANCE) 10 MG TABS tablet Take by mouth.   FLUoxetine (PROZAC) 20 MG capsule Take by mouth.   furosemide (LASIX) 20 MG tablet Take 1 tablet (20 mg total) by mouth daily.   insulin lispro (HUMALOG) 100 UNIT/ML KiwkPen Inject 2-10 Units into the skin 4 (four) times daily -  before meals and at bedtime. Per sliding scale   Insulin Pen Needle 31G X 8 MM MISC by Does not apply route.   Lancets (ONETOUCH DELICA PLUS [id number] MISC USE TO CHECK GLUCOSE 4 TIMES DAILY   levothyroxine (EUTHYROX) 150 MCG tablet Take 1 tablet (150 mcg total) by  mouth daily before breakfast.   OneTouch Delica Lancets 42A MISC Use 4 (four) times daily   ramipril (ALTACE) 10 MG capsule Take 1 capsule (10 mg total) by mouth daily.   Vitamin D, Ergocalciferol, (DRISDOL) 1.25 MG (50000 UNIT) CAPS capsule Take 1 capsule (50,000 Units total) by mouth once a week.   celecoxib (CELEBREX) 200 MG capsule Take 1 capsule (200 mg total) by mouth 2 (two) times daily.   enoxaparin (LOVENOX) 40 MG/0.4ML injection Inject 0.4 mLs (40 mg total) into the skin daily.   FLUoxetine (PROZAC) 20 MG capsule TAKE 1 CAPSULE BY  MOUTH ONCE DAILY . APPOINTMENT REQUIRED FOR FUTURE REFILLS   insulin glargine (LANTUS SOLOSTAR) 100 UNIT/ML Solostar Pen Inject 32 Units into the skin at bedtime.    ondansetron (ZOFRAN) 4 MG tablet Take 1 tablet (4 mg total) by mouth every 6 (six) hours as needed for nausea.   oxyCODONE (OXY IR/ROXICODONE) 5 MG immediate release tablet Take 1-2 tablets (5-10 mg total) by mouth every 4 (four) hours as needed for moderate pain (pain score 4-6).   quinapril (ACCUPRIL) 10 MG tablet Take by mouth.   traMADol (ULTRAM) 50 MG tablet Take 1 tablet (50 mg total) by mouth every 6 (six) hours as needed for moderate pain.   No facility-administered encounter medications on file as of 01/13/2022.    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 Surgeon: Lollie Sails, MD;  Location: [facility] ENDOSCOPY;   CYST EXCISION  2017   back of neck   EYE SURGERY Bilateral    cataracts   EYE SURGERY Right 01/20/2017   laser surgery for diabetic retinopathy   KNEE ARTHROPLASTY Right 01/27/2021   Procedure: COMPUTER ASSISTED TOTAL KNEE ARTHROPLASTY;  Surgeon: Dereck Leep, MD;  Location: [facility] ORS;  Service: Orthopedics;  Laterality: Right;   TOTAL KNEE ARTHROPLASTY Left 09/17/2018   Procedure: LEFT TOTAL KNEE ARTHROPLASTY;  Surgeon: Dereck Leep,  MD;  Location: [facility] ORS;  Service: Orthopedics;  Laterality: Left;   Family History  Problem Relation Age of Onset   Lupus Mother    Hypertension Father    Diabetes Father    Heart attack Father    Diabetes Sister    Diabetes Sister    Hypertension Sister    Hypertension Sister    Breast cancer Neg Hx    Social History   Socioeconomic History   Marital status: Divorced    Spouse name: Not on file   Number of children: 2   Years of education: Not on file   Highest education level: Some college, no degree  Occupational History   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   Patient feels safe in her home.  Only 2 stairs to enter the home.    Her grandson will get her to her appointments.   Social Determinants of Health   Financial Resource Strain: Low Risk  (01/13/2022)   Overall Financial Resource Strain (CARDIA)    Difficulty of Paying Living Expenses: Not hard at all  Food Insecurity: No Food Insecurity (01/13/2022)   Hunger Vital Sign    Worried About Running Out of Food in the Last Year: Never true    Ran Out of Food in the Last Year: Never true  Transportation Needs: No Transportation Needs (01/13/2022)   PRAPARE - Hydrologist (Medical): No    Lack of Transportation (Non-Medical): No  Physical Activity: Insufficiently Active (01/13/2022)   Exercise Vital Sign    Days of Exercise per Week: 3 days    Minutes of Exercise per Session: 20 min  Stress: No Stress Concern Present (01/13/2022)   [place]    Feeling of Stress : Only a little  Social Connections: Socially Isolated (01/13/2022)   Social Connection and Isolation Panel [NHANES]    Frequency of Communication with Friends and Family: More than three times a week    Frequency of Social Gatherings with Friends and Family: Once a week    Attends Religious Services: Never    Marine scientist or Organizations: No    Attends Music therapist: Never    Marital Status: Divorced    Tobacco Counseling Counseling given: Not Answered   Clinical Intake:  Pre-visit preparation completed: Yes        Nutritional Risks: None Diabetes: Yes CBG done?: No Did pt. bring in CBG monitor from home?: No  How often do you need to have someone help you when you read instructions, pamphlets, or other written materials from your doctor or pharmacy?: 1 - Never  Diabetic?yes Nutrition Risk Assessment:  Has the patient had any N/V/D within the last 2 months?  No  Does the patient have any non-healing wounds?  No  Has the patient had any unintentional weight loss or weight gain?  No   Diabetes:  Is the patient diabetic?  Yes  If diabetic, was a CBG obtained today?  No  Did the patient bring in their glucometer from home?  No  How often do you monitor your CBG's? continuous  Financial Strains and Diabetes Management:  Are you having any financial strains with the device, your supplies or your medication? No .  Does the patient want to be seen by Chronic Care Management for management of their diabetes?  No  Would the patient like to be referred to a Nutritionist or for Diabetic Management?  No   Diabetic Exams:  Diabetic Eye Exam: Completed 10/18/20. Overdue for diabetic eye exam. Pt has been advised about the importance in completing this exam..  Diabetic Foot Exam: Completed 01/01/20. Pt has been advised about the importance in completing this exam.     Interpreter Needed?:  No  Information entered by :: Kirke Shaggy, LPN   Activities of Daily Living    01/13/2022   11:03 AM 01/13/2022    8:17 AM  In your present state of health, do you have any difficulty performing the following activities:  Hearing? 0 0  Vision? 0 0  Difficulty concentrating or making decisions? 0 0  Walking or climbing stairs? 1 1  Dressing or bathing? 0 0  Doing errands, shopping? 0 0  Preparing Food and eating ? N N  Using the Toilet? N N  In the past six months, have you accidently leaked urine? Y Y  Do you have problems with loss of bowel control? N N  Managing your Medications? N N  Managing your Finances? N N  Housekeeping or managing your Housekeeping? N N    Patient Care Team: Jerrol Banana., MD as PCP - General (Family Medicine) Lavonia Dana, MD as Consulting Physician (Internal Medicine) Gabriel Carina, Betsey Holiday, MD as Physician Assistant (Endocrinology) Marry Guan, Laurice Record, MD (Orthopedic Surgery) [facility], Abbott (Optometry)  Indicate any recent Medical Services you may have received from other than [facility] providers in the past year (date may be approximate).     Assessment:   This is a routine wellness examination for [place].  Hearing/Vision screen Hearing Screening - Comments:: No aids Vision Screening - Comments:: Wears glasses- Dr.Zhang  Dietary issues and exercise activities discussed: Current Exercise Habits: Home exercise routine, Type of exercise: walking, Time (Minutes): 20, Frequency (Times/Week): 2, Weekly Exercise (Minutes/Week): 40, Intensity: Mild   Goals Addressed             This Visit's Progress    DIET - EAT MORE FRUITS AND VEGETABLES         Depression Screen    01/13/2022   11:00 AM 01/12/2021    2:41 PM 10/01/2020   11:46 AM 01/01/2020    1:33 PM 12/31/2018    2:27 PM 12/31/2018    2:24 PM 10/10/2017    1:54 PM  PHQ 2/9 Scores  PHQ - 2 Score 0 1 0 0 0 0 1  PHQ- 9 Score 0 7 0  3      Fall Risk     01/13/2022   11:03 AM 01/13/2022    8:17 AM 01/12/2021    2:40 PM 01/01/2020    1:37 PM 12/31/2018    2:24 PM  Fall Risk   Falls in the past year? 1 1 0 0 0  Number falls in past yr: 0 0 0 0   Injury with Fall? 0 0 0 0   Risk for fall due to :   Impaired mobility;Impaired balance/gait;Orthopedic patient    Follow up Falls evaluation completed;Falls prevention discussed  Falls evaluation completed      FALL RISK PREVENTION PERTAINING TO THE HOME:  Any stairs in or around the home? No  If so, are there any without handrails? No  Home free of loose throw rugs in walkways, pet beds, electrical cords, etc? Yes  Adequate lighting in your home to reduce risk of falls? Yes   ASSISTIVE DEVICES UTILIZED TO PREVENT FALLS:  Life alert? No  Use of a cane, walker or w/c? Yes  Grab bars in the bathroom? Yes  Shower chair or bench in shower? Yes  Elevated toilet seat or a handicapped toilet? Yes    Cognitive Function:        01/13/2022   11:06 AM 10/06/2016    2:48 PM  6CIT Screen  What Year? 0 points 0 points  What month? 0 points 0 points  What time? 0 points 0 points  Count back from 20 0 points 0 points  Months in reverse 0 points 0 points  Repeat phrase 0 points 2 points  Total Score 0 points 2 points    Immunizations Immunization History  Administered Date(s) Administered   Fluad Quad(high Dose 65+) 04/16/2019, 04/02/2020   Influenza, High Dose Seasonal PF 04/08/2015, 04/22/2016, 05/26/2018   PFIZER(Purple Top)SARS-COV-2 Vaccination 09/26/2019, 10/17/2019, 05/15/2020   Pneumococcal Conjugate-13 06/03/2014   Pneumococcal Polysaccharide-23 06/06/2012   Tdap 12/06/2012   Zoster, Live 12/06/2012    TDAP status: Up to date  Flu Vaccine status: Declined, Education has been provided regarding the importance of this vaccine but patient still declined. Advised may receive this vaccine at local pharmacy or Health Dept. Aware to provide a copy of the vaccination record if obtained from  local pharmacy or Health Dept. Verbalized acceptance and understanding.  Pneumococcal vaccine status: Up to date  Covid-19 vaccine status: Completed vaccines  Qualifies for Shingles Vaccine? Yes   Zostavax completed Yes   Shingrix Completed?: No.    Education has been provided regarding the importance of this vaccine. Patient has been advised to call insurance company to determine out of pocket expense if they have not yet received this vaccine. Advised may also receive vaccine at local pharmacy or Health Dept. Verbalized acceptance and understanding.  Screening Tests Health Maintenance  Topic Date Due   Zoster Vaccines- Shingrix (1 of 2) Never done   COVID-19 Vaccine (4 - Pfizer series) 07/10/2020   FOOT EXAM  12/31/2020   HEMOGLOBIN A1C  07/17/2021   OPHTHALMOLOGY EXAM  10/18/2021   INFLUENZA VACCINE  02/22/2022   TETANUS/TDAP  12/07/2022   DEXA SCAN  04/02/2024   COLONOSCOPY (Pts 45-26yr Insurance coverage will need to be confirmed)  03/18/2026   Pneumonia Vaccine 76 Years old  Completed   Hepatitis C Screening  Completed   HPV VACCINES  Aged Out    Health Maintenance  Health Maintenance Due  Topic Date Due   Zoster Vaccines- Shingrix (1 of 2) Never done   COVID-19 Vaccine (4 - Pfizer series) 07/10/2020   FOOT EXAM  12/31/2020   HEMOGLOBIN A1C  07/17/2021   OPHTHALMOLOGY EXAM  10/18/2021    Colorectal cancer screening: Type of screening: Colonoscopy. Completed 03/18/16. Repeat every 10 years  Mammogram status: No longer required due to age.  Bone Density status: Completed 04/03/19. Results reflect: Bone density results: NORMAL. Repeat every 5 years.  Lung Cancer Screening: (Low Dose CT Chest recommended if Age 76-80years, 30 pack-year currently smoking OR have quit w/in 15years.) does not qualify.  Additional Screening:  Hepatitis C Screening: does qualify; Completed 12/24/12  Vision Screening: Recommended annual ophthalmology exams for early detection of glaucoma  and other disorders of the eye. Is the patient up to date with their annual eye exam?  Yes  Who is the provider or what is the name of the office in which the patient attends annual eye exams[place] is not established with a provider, would they like to be referred to a provider to establish care? No .   Dental Screening: Recommended annual dental exams for proper oral hygiene  Community Resource Referral / Chronic Care Management: CRR required this visit?  No   CCM required this visit?  No      Plan:     I have personally  reviewed and noted the following in the patient's chart:   Medical and social history Use of alcohol, tobacco or illicit drugs  Current medications and supplements including opioid prescriptions.  Functional ability and status Nutritional status Physical activity Advanced directives List of other physicians Hospitalizations, surgeries, and ER visits in previous 12 months Vitals Screenings to include cognitive, depression, and falls Referrals and appointments  In addition, I have reviewed and discussed with patient certain preventive protocols, quality metrics, and best practice recommendations. A written personalized care plan for preventive services as well as general preventive health recommendations were provided to patient.     Dionisio David, LPN   [id number]   Nurse Notes:

## 2022-01-13 NOTE — Patient Instructions (Signed)
Mary [name] , Thank you for taking time to come for your Medicare Wellness Visit. I appreciate your ongoing commitment to your health goals. Please review the following plan we discussed and let me know if I can assist you in the future.   Screening recommendations/referrals: Colonoscopy: aged out Mammogram: aged out Bone Density: 04/03/19 Recommended yearly ophthalmology/optometry visit for glaucoma screening and checkup Recommended yearly dental visit for hygiene and checkup  Vaccinations: Influenza vaccine: n/d Pneumococcal vaccine: 05/1014 Tdap vaccine: 12/06/12 Shingles vaccine: Zostavax 12/06/12   Covid-19:09/26/19, 10/17/19, 05/15/20  Advanced directives: no  Conditions/risks identified: none  Next appointment: Follow up in one year for your annual wellness visit 01/18/23 @ 10 am by phone   Preventive Care 65 Years and Older, Female Preventive care refers to lifestyle choices and visits with your health care provider that can promote health and wellness. What does preventive care include? A yearly physical exam. This is also called an annual well check. Dental exams once or twice a year. Routine eye exams. Ask your health care provider how often you should have your eyes checked. Personal lifestyle choices, including: Daily care of your teeth and gums. Regular physical activity. Eating a healthy diet. Avoiding tobacco and drug use. Limiting alcohol use. Practicing safe sex. Taking low-dose aspirin every day. Taking vitamin and mineral supplements as recommended by your health care provider. What happens during an annual well check? The services and screenings done by your health care provider during your annual well check will depend on your age, overall health, lifestyle risk factors, and family history of disease. Counseling  Your health care provider may ask you questions about your: Alcohol use. Tobacco use. Drug use. Emotional well-being. Home and relationship  well-being. Sexual activity. Eating habits. History of falls. Memory and ability to understand (cognition). Work and work Astronomer. Reproductive health. Screening  You may have the following tests or measurements: Height, weight, and BMI. Blood pressure. Lipid and cholesterol levels. These may be checked every 5 years, or more frequently if you are over 6 years old. Skin check. Lung cancer screening. You may have this screening every year starting at age 95 if you have a 30-pack-year history of smoking and currently smoke or have quit within the past 15 years. Fecal occult blood test (FOBT) of the stool. You may have this test every year starting at age 6. Flexible sigmoidoscopy or colonoscopy. You may have a sigmoidoscopy every 5 years or a colonoscopy every 10 years starting at age 21. Hepatitis C blood test. Hepatitis B blood test. Sexually transmitted disease (STD) testing. Diabetes screening. This is done by checking your blood sugar (glucose) after you have not eaten for a while (fasting). You may have this done every 1-3 years. Bone density scan. This is done to screen for osteoporosis. You may have this done starting at age 31. Mammogram. This may be done every 1-2 years. Talk to your health care provider about how often you should have regular mammograms. Talk with your health care provider about your test results, treatment options, and if necessary, the need for more tests. Vaccines  Your health care provider may recommend certain vaccines, such as: Influenza vaccine. This is recommended every year. Tetanus, diphtheria, and acellular pertussis (Tdap, Td) vaccine. You may need a Td booster every 10 years. Zoster vaccine. You may need this after age 53. Pneumococcal 13-valent conjugate (PCV13) vaccine. One dose is recommended after age 65. Pneumococcal polysaccharide (PPSV23) vaccine. One dose is recommended after age 100. Talk to  your health care provider about which  screenings and vaccines you need and how often you need them. This information is not intended to replace advice given to you by your health care provider. Make sure you discuss any questions you have with your health care provider. Document Released: 08/07/2015 Document Revised: 03/30/2016 Document Reviewed: 05/12/2015 Elsevier Interactive Patient Education  2017 [place] Prevention in the Home Falls can cause injuries. They can happen to people of all ages. There are many things you can do to make your home safe and to help prevent falls. What can I do on the outside of my home? Regularly fix the edges of walkways and driveways and fix any cracks. Remove anything that might make you trip as you walk through a door, such as a raised step or threshold. Trim any bushes or trees on the path to your home. Use bright outdoor lighting. Clear any walking paths of anything that might make someone trip, such as rocks or tools. Regularly check to see if handrails are loose or broken. Make sure that both sides of any steps have handrails. Any raised decks and porches should have guardrails on the edges. Have any leaves, snow, or ice cleared regularly. Use sand or salt on walking paths during winter. Clean up any spills in your garage right away. This includes oil or grease spills. What can I do in the bathroom? Use night lights. Install grab bars by the toilet and in the tub and shower. Do not use towel bars as grab bars. Use non-skid mats or decals in the tub or shower. If you need to sit down in the shower, use a plastic, non-slip stool. Keep the floor dry. Clean up any water that spills on the floor as soon as it happens. Remove soap buildup in the tub or shower regularly. Attach bath mats securely with double-sided non-slip rug tape. Do not have throw rugs and other things on the floor that can make you trip. What can I do in the bedroom? Use night lights. Make sure that you have a  light by your bed that is easy to reach. Do not use any sheets or blankets that are too big for your bed. They should not hang down onto the floor. Have a firm chair that has side arms. You can use this for support while you get dressed. Do not have throw rugs and other things on the floor that can make you trip. What can I do in the kitchen? Clean up any spills right away. Avoid walking on wet floors. Keep items that you use a lot in easy-to-reach places. If you need to reach something above you, use a strong step stool that has a grab bar. Keep electrical cords out of the way. Do not use floor polish or wax that makes floors slippery. If you must use wax, use non-skid floor wax. Do not have throw rugs and other things on the floor that can make you trip. What can I do with my stairs? Do not leave any items on the stairs. Make sure that there are handrails on both sides of the stairs and use them. Fix handrails that are broken or loose. Make sure that handrails are as long as the stairways. Check any carpeting to make sure that it is firmly attached to the stairs. Fix any carpet that is loose or worn. Avoid having throw rugs at the top or bottom of the stairs. If you do have throw rugs, attach  them to the floor with carpet tape. Make sure that you have a light switch at the top of the stairs and the bottom of the stairs. If you do not have them, ask someone to add them for you. What else can I do to help prevent falls? Wear shoes that: Do not have high heels. Have rubber bottoms. Are comfortable and fit you well. Are closed at the toe. Do not wear sandals. If you use a stepladder: Make sure that it is fully opened. Do not climb a closed stepladder. Make sure that both sides of the stepladder are locked into place. Ask someone to hold it for you, if possible. Clearly mark and make sure that you can see: Any grab bars or handrails. First and last steps. Where the edge of each step  is. Use tools that help you move around (mobility aids) if they are needed. These include: Canes. Walkers. Scooters. Crutches. Turn on the lights when you go into a dark area. Replace any light bulbs as soon as they burn out. Set up your furniture so you have a clear path. Avoid moving your furniture around. If any of your floors are uneven, fix them. If there are any pets around you, be aware of where they are. Review your medicines with your doctor. Some medicines can make you feel dizzy. This can increase your chance of falling. Ask your doctor what other things that you can do to help prevent falls. This information is not intended to replace advice given to you by your health care provider. Make sure you discuss any questions you have with your health care provider. Document Released: 05/07/2009 Document Revised: 12/17/2015 Document Reviewed: 08/15/2014 Elsevier Interactive Patient Education  2017 [employer].

## 2022-01-14 NOTE — Progress Notes (Deleted)
Complete physical exam   Patient: Mary [name]   DOB: Sep 09, 1945   76 y.o. Female  MRN: [id number] Visit Date: 01/17/2022  Today's healthcare provider: [name], MD   No chief complaint on file.  Subjective    Mary [name] is a 76 y.o. female who presents today for a complete physical exam.  She reports consuming a {diet types:17450} diet. {Exercise:19826} She generally feels {well/fairly well/poorly:18703}. She reports sleeping {well/fairly well/poorly:18703}. She {does/does not:200015} have additional problems to discuss today.  HPI  Patient had AWV with [facility] on 01/13/2022.  Past Medical History:  Diagnosis Date   Arthritis    degenerative arthritis left knee   Carpal tunnel syndrome, bilateral    Chronic kidney disease    stage 3   Depression    Diabetes mellitus without complication ([place])    Diabetic peripheral neuropathy associated with type 1 diabetes mellitus ([place])    Diabetic retinal damage of both eyes ([facility])    History of shingles    Hyperlipidemia    Hypertension    Hypothyroidism    Obesity    Past Surgical History:  Procedure Laterality Date   CATARACT EXTRACTION Bilateral 2015   CESAREAN SECTION  1980   COLONOSCOPY WITH PROPOFOL N/A 03/18/2016   redundant colon, otherwise normal repeat 10 years. Surgeon: Lollie Sails, MD;  Location: [facility] ENDOSCOPY;   CYST EXCISION  2017   back of neck   EYE SURGERY Bilateral    cataracts   EYE SURGERY Right 01/20/2017   laser surgery for diabetic retinopathy   KNEE ARTHROPLASTY Right 01/27/2021   Procedure: COMPUTER ASSISTED TOTAL KNEE ARTHROPLASTY;  Surgeon: Dereck Leep, MD;  Location: [facility] ORS;  Service: Orthopedics;  Laterality: Right;   TOTAL KNEE ARTHROPLASTY Left 09/17/2018   Procedure: LEFT TOTAL KNEE ARTHROPLASTY;  Surgeon: Dereck Leep, MD;  Location: [facility] ORS;  Service: Orthopedics;  Laterality: Left;   Social History   Socioeconomic History   Marital status: Divorced    Spouse  name: Not on file   Number of children: 2   Years of education: Not on file   Highest education level: Some college, no degree  Occupational History   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   Patient feels safe in her home.  Only 2 stairs to enter the home.    Her grandson will get her to her appointments.   Social Determinants of Health   Financial Resource Strain: Low Risk  (01/13/2022)   Overall Financial Resource Strain (CARDIA)    Difficulty of Paying Living Expenses: Not hard at all  Food Insecurity: No Food Insecurity (01/13/2022)   Hunger Vital Sign    Worried About Running Out of Food in the Last Year: Never true    Ran Out of Food in the Last Year: Never true  Transportation Needs: No Transportation Needs (01/13/2022)   PRAPARE - Hydrologist (Medical): No  Lack of Transportation (Non-Medical): No  Physical Activity: Insufficiently Active (01/13/2022)   Exercise Vital Sign    Days of Exercise per Week: 3 days    Minutes of Exercise per Session: 20 min  Stress: No Stress Concern Present (01/13/2022)   [place]    Feeling of Stress : Only a little  Social Connections: Socially Isolated (01/13/2022)   Social Connection and Isolation Panel [NHANES]    Frequency of Communication with Friends and Family: More than three times a week    Frequency of Social Gatherings with Friends and Family: Once a week    Attends Religious Services: Never    Building surveyor or Organizations: No    Attends Archivist Meetings: Never    Marital Status: Divorced  Human resources officer Violence: Not At Risk (01/13/2022)   Humiliation, Afraid, Rape, and Kick questionnaire    Fear of Current or Ex-Partner: No    Emotionally Abused: No    Physically Abused: No    Sexually Abused: No   Family Status  Relation Name Status   Mother  Deceased at age 35   Father  Deceased at age 48   Sister  Alive   Sister  Alive   Sister  Alive   Neg Hx  (Not Specified)   Family History  Problem Relation Age of Onset   Lupus Mother    Hypertension Father    Diabetes Father    Heart attack Father    Diabetes Sister    Diabetes Sister    Hypertension Sister    Hypertension Sister    Breast cancer Neg Hx    Allergies  Allergen Reactions   Hydrocodone Itching   Oxycodone Hcl Itching    Patient Care Team: Jerrol Banana., MD as PCP - General (Family Medicine) Lavonia Dana, MD as Consulting Physician (Internal Medicine) Solum, Betsey Holiday, MD as Physician Assistant (Endocrinology) Hooten, Laurice Record, MD (Orthopedic Surgery) Pa, [place] (Optometry)   Medications: Outpatient Medications Prior to Visit  Medication Sig   ACCU-CHEK AVIVA PLUS test strip USE TEST STRIP(S) TO CHECK GLUCOSE 4 TIMES DAILY   acetaminophen (TYLENOL) 650 MG CR tablet Take 1,300 mg by mouth every 8 (eight) hours as needed for pain.   atorvastatin (LIPITOR) 10 MG tablet Take 1 tablet (10 mg total) by mouth daily.   Blood Glucose Monitoring Suppl (ONETOUCH VERIO FLEX SYSTEM) w/Device KIT USE TO CHECK GLUCOSE 4 TIMES DAILY   carvedilol (COREG) 12.5 MG tablet Take 2 tablets (25 mg total) by mouth 2 (two) times daily.   celecoxib (CELEBREX) 200 MG capsule Take 1 capsule (200 mg total) by mouth 2 (two) times daily.   Cyanocobalamin (VITAMIN B-12) 2500 MCG SUBL Place 2,500 mcg under the tongue daily.   diclofenac Sodium (VOLTAREN) 1 % GEL Apply 2 g topically 4 (four)  times daily as needed (pain).   empagliflozin (JARDIANCE) 10 MG TABS tablet Take by mouth.   enoxaparin (LOVENOX) 40 MG/0.4ML injection Inject 0.4 mLs (40 mg total) into the skin daily.   FLUoxetine (PROZAC) 20 MG capsule TAKE 1 CAPSULE BY MOUTH ONCE DAILY . APPOINTMENT REQUIRED FOR FUTURE REFILLS   FLUoxetine (PROZAC) 20 MG capsule Take by mouth.   furosemide (LASIX) 20 MG tablet Take 1 tablet (20 mg total) by mouth daily.   insulin glargine (LANTUS SOLOSTAR) 100 UNIT/ML Solostar Pen Inject 32 Units into the skin at bedtime.    insulin lispro (  HUMALOG) 100 UNIT/ML KiwkPen Inject 2-10 Units into the skin 4 (four) times daily -  before meals and at bedtime. Per sliding scale   Insulin Pen Needle 31G X 8 MM MISC by Does not apply route.   Lancets (ONETOUCH DELICA PLUS [id number]) MISC USE TO CHECK GLUCOSE 4 TIMES DAILY   levothyroxine (EUTHYROX) 150 MCG tablet Take 1 tablet (150 mcg total) by mouth daily before breakfast.   ondansetron (ZOFRAN) 4 MG tablet Take 1 tablet (4 mg total) by mouth every 6 (six) hours as needed for nausea.   OneTouch Delica Lancets 67E MISC Use 4 (four) times daily   oxyCODONE (OXY IR/ROXICODONE) 5 MG immediate release tablet Take 1-2 tablets (5-10 mg total) by mouth every 4 (four) hours as needed for moderate pain (pain score 4-6).   quinapril (ACCUPRIL) 10 MG tablet Take by mouth.   ramipril (ALTACE) 10 MG capsule Take 1 capsule (10 mg total) by mouth daily.   traMADol (ULTRAM) 50 MG tablet Take 1 tablet (50 mg total) by mouth every 6 (six) hours as needed for moderate pain.   Vitamin D, Ergocalciferol, (DRISDOL) 1.25 MG (50000 UNIT) CAPS capsule Take 1 capsule (50,000 Units total) by mouth once a week.   No facility-administered medications prior to visit.    Review of Systems  All other systems reviewed and are negative.   {Labs  Heme  Chem  Endocrine  Serology  Results Review (optional):23779}  Objective    There were no vitals taken for this  visit. {Show previous vital signs (optional):23777}   Physical Exam  ***  Last depression screening scores    01/13/2022   11:00 AM 01/12/2021    2:41 PM 10/01/2020   11:46 AM  PHQ 2/9 Scores  PHQ - 2 Score 0 1 0  PHQ- 9 Score 0 7 0   Last fall risk screening    01/13/2022   11:03 AM  [place] in the past year? 1  Number falls in past yr: 0  Injury with Fall? 0  Follow up Falls evaluation completed;Falls prevention discussed   Last Audit-C alcohol use screening    01/13/2022   10:59 AM  Alcohol Use Disorder Test (AUDIT)  1. How often do you have a drink containing alcohol? 1  2. How many drinks containing alcohol do you have on a typical day when you are drinking? 0  3. How often do you have six or more drinks on one occasion? 0  AUDIT-C Score 1   A score of 3 or more in women, and 4 or more in men indicates increased risk for alcohol abuse, EXCEPT if all of the points are from question 1   No results found for any visits on 01/17/22.  Assessment & Plan    Routine Health Maintenance and Physical Exam  Exercise Activities and Dietary recommendations  Goals      Cut out extra servings     Recommend to continue current diet plan of cutting out carbohydrates and sugars in daily diet.       DIET - EAT MORE FRUITS AND VEGETABLES        Immunization History  Administered Date(s) Administered   Fluad Quad(high Dose 65+) 04/16/2019, 04/02/2020   Influenza, High Dose Seasonal PF 04/08/2015, 04/22/2016, 05/26/2018   PFIZER(Purple Top)SARS-COV-2 Vaccination 09/26/2019, 10/17/2019, 05/15/2020   Pneumococcal Conjugate-13 06/03/2014   Pneumococcal Polysaccharide-23 06/06/2012   Tdap 12/06/2012   Zoster, Live 12/06/2012    Health Maintenance  Topic  Date Due   Zoster Vaccines- Shingrix (1 of 2) Never done   COVID-19 Vaccine (4 - Pfizer series) 07/10/2020   FOOT EXAM  12/31/2020   HEMOGLOBIN A1C  07/17/2021   OPHTHALMOLOGY EXAM  10/18/2021   INFLUENZA  VACCINE  02/22/2022   TETANUS/TDAP  12/07/2022   DEXA SCAN  04/02/2024   COLONOSCOPY (Pts 45-53yrs Insurance coverage will need to be confirmed)  03/18/2026   Pneumonia Vaccine 22+ Years old  Completed   Hepatitis C Screening  Completed   HPV VACCINES  Aged Out    Discussed health benefits of physical activity, and encouraged her to engage in regular exercise appropriate for her age and condition.  ***  No follow-ups on file.     {provider attestation***:1}   [name], MD  [facility] [phone] (phone) [phone] (fax)  [place]

## 2022-01-17 ENCOUNTER — Encounter: Payer: Medicare Other | Admitting: Family Medicine

## 2022-01-17 DIAGNOSIS — Z0000 Encounter for general adult medical examination without abnormal findings: Secondary | ICD-10-CM

## 2022-01-17 DIAGNOSIS — E1065 Type 1 diabetes mellitus with hyperglycemia: Secondary | ICD-10-CM

## 2022-01-17 DIAGNOSIS — E7849 Other hyperlipidemia: Secondary | ICD-10-CM

## 2022-01-17 DIAGNOSIS — I10 Essential (primary) hypertension: Secondary | ICD-10-CM

## 2022-01-17 DIAGNOSIS — E039 Hypothyroidism, unspecified: Secondary | ICD-10-CM

## 2022-01-17 DIAGNOSIS — M170 Bilateral primary osteoarthritis of knee: Secondary | ICD-10-CM

## 2022-01-17 DIAGNOSIS — N1831 Chronic kidney disease, stage 3a: Secondary | ICD-10-CM

## 2022-01-19 DIAGNOSIS — E1065 Type 1 diabetes mellitus with hyperglycemia: Secondary | ICD-10-CM | POA: Diagnosis not present

## 2022-01-24 DIAGNOSIS — G5602 Carpal tunnel syndrome, left upper limb: Secondary | ICD-10-CM | POA: Diagnosis not present

## 2022-01-26 DIAGNOSIS — E1022 Type 1 diabetes mellitus with diabetic chronic kidney disease: Secondary | ICD-10-CM | POA: Diagnosis not present

## 2022-01-26 DIAGNOSIS — E103313 Type 1 diabetes mellitus with moderate nonproliferative diabetic retinopathy with macular edema, bilateral: Secondary | ICD-10-CM | POA: Diagnosis not present

## 2022-01-26 DIAGNOSIS — E1042 Type 1 diabetes mellitus with diabetic polyneuropathy: Secondary | ICD-10-CM | POA: Diagnosis not present

## 2022-01-26 DIAGNOSIS — N183 Chronic kidney disease, stage 3 unspecified: Secondary | ICD-10-CM | POA: Diagnosis not present

## 2022-02-18 DIAGNOSIS — E1065 Type 1 diabetes mellitus with hyperglycemia: Secondary | ICD-10-CM | POA: Diagnosis not present

## 2022-03-21 DIAGNOSIS — E1065 Type 1 diabetes mellitus with hyperglycemia: Secondary | ICD-10-CM | POA: Diagnosis not present

## 2022-03-29 ENCOUNTER — Telehealth: Payer: Self-pay

## 2022-03-29 NOTE — Telephone Encounter (Signed)
Copied from CRM [id number]. Topic: General - Other >> Mar 29, 2022  1:52 PM Lyman Speller wrote: Reason for CRM: Pt would like Dr. Wonda Olds advice on selecting new PCP in office / please advise

## 2022-03-31 DIAGNOSIS — I10 Essential (primary) hypertension: Secondary | ICD-10-CM | POA: Diagnosis not present

## 2022-03-31 DIAGNOSIS — N1832 Chronic kidney disease, stage 3b: Secondary | ICD-10-CM | POA: Diagnosis not present

## 2022-03-31 DIAGNOSIS — N2581 Secondary hyperparathyroidism of renal origin: Secondary | ICD-10-CM | POA: Diagnosis not present

## 2022-03-31 DIAGNOSIS — E1122 Type 2 diabetes mellitus with diabetic chronic kidney disease: Secondary | ICD-10-CM | POA: Diagnosis not present

## 2022-04-04 ENCOUNTER — Other Ambulatory Visit: Payer: Self-pay | Admitting: Family Medicine

## 2022-04-04 DIAGNOSIS — N2581 Secondary hyperparathyroidism of renal origin: Secondary | ICD-10-CM | POA: Diagnosis not present

## 2022-04-04 DIAGNOSIS — I10 Essential (primary) hypertension: Secondary | ICD-10-CM | POA: Diagnosis not present

## 2022-04-04 DIAGNOSIS — N1832 Chronic kidney disease, stage 3b: Secondary | ICD-10-CM | POA: Diagnosis not present

## 2022-04-04 DIAGNOSIS — E1122 Type 2 diabetes mellitus with diabetic chronic kidney disease: Secondary | ICD-10-CM | POA: Diagnosis not present

## 2022-04-19 ENCOUNTER — Ambulatory Visit (INDEPENDENT_AMBULATORY_CARE_PROVIDER_SITE_OTHER): Payer: Medicare Other

## 2022-04-19 DIAGNOSIS — Z23 Encounter for immunization: Secondary | ICD-10-CM

## 2022-04-20 DIAGNOSIS — E1065 Type 1 diabetes mellitus with hyperglycemia: Secondary | ICD-10-CM | POA: Diagnosis not present

## 2022-04-28 DIAGNOSIS — Z96653 Presence of artificial knee joint, bilateral: Secondary | ICD-10-CM | POA: Diagnosis not present

## 2022-05-02 DIAGNOSIS — E039 Hypothyroidism, unspecified: Secondary | ICD-10-CM | POA: Diagnosis not present

## 2022-05-02 DIAGNOSIS — E103313 Type 1 diabetes mellitus with moderate nonproliferative diabetic retinopathy with macular edema, bilateral: Secondary | ICD-10-CM | POA: Diagnosis not present

## 2022-05-09 DIAGNOSIS — E039 Hypothyroidism, unspecified: Secondary | ICD-10-CM | POA: Diagnosis not present

## 2022-05-09 DIAGNOSIS — E1022 Type 1 diabetes mellitus with diabetic chronic kidney disease: Secondary | ICD-10-CM | POA: Diagnosis not present

## 2022-05-09 DIAGNOSIS — E103313 Type 1 diabetes mellitus with moderate nonproliferative diabetic retinopathy with macular edema, bilateral: Secondary | ICD-10-CM | POA: Diagnosis not present

## 2022-05-09 DIAGNOSIS — E1042 Type 1 diabetes mellitus with diabetic polyneuropathy: Secondary | ICD-10-CM | POA: Diagnosis not present

## 2022-05-12 ENCOUNTER — Encounter: Payer: Self-pay | Admitting: Physician Assistant

## 2022-05-12 ENCOUNTER — Ambulatory Visit (INDEPENDENT_AMBULATORY_CARE_PROVIDER_SITE_OTHER): Payer: Medicare Other | Admitting: Physician Assistant

## 2022-05-12 VITALS — BP 139/59 | HR 60 | Temp 98.6°F | Resp 16 | Wt 202.0 lb

## 2022-05-12 DIAGNOSIS — R051 Acute cough: Secondary | ICD-10-CM

## 2022-05-12 DIAGNOSIS — J019 Acute sinusitis, unspecified: Secondary | ICD-10-CM | POA: Diagnosis not present

## 2022-05-12 MED ORDER — CETIRIZINE HCL 10 MG PO TABS: 10.0000 mg | ORAL_TABLET | Freq: Every day | ORAL | 11 refills | Status: AC

## 2022-05-12 MED ORDER — FLUTICASONE PROPIONATE 50 MCG/ACT NA SUSP: 2.0000 | Freq: Every day | NASAL | 6 refills | Status: AC

## 2022-05-12 NOTE — Progress Notes (Signed)
I,[name],acting as a Education administrator for [employer], PA-C.,have documented all relevant documentation on the behalf of Mardene Speak, PA-C,as directed by  [employer], PA-C while in the presence of [employer], PA-C.   Established patient visit   Patient: Mary [name]   DOB: [birth year]   76 y.o. Female  MRN: [id number] Visit Date: 05/12/2022  Today's healthcare provider: Mardene Speak, PA-C   Chief Complaint  Patient presents with   Cough   Fatigue   Subjective    HPI  Patient has had cough and nasal congestion for 3-4 days. Patient also has scratchy throat and ear fullness. No fever. Patient has tested for covid twice at home. Patient has been treating symptoms with Mucinex with no relief.   Medications: Outpatient Medications Prior to Visit  Medication Sig   ACCU-CHEK AVIVA PLUS test strip USE TEST STRIP(S) TO CHECK GLUCOSE 4 TIMES DAILY   acetaminophen (TYLENOL) 650 MG CR tablet Take 1,300 mg by mouth every 8 (eight) hours as needed for pain.   atorvastatin (LIPITOR) 10 MG tablet Take 1 tablet (10 mg total) by mouth daily.   Blood Glucose Monitoring Suppl (ONETOUCH VERIO FLEX SYSTEM) w/Device KIT USE TO CHECK GLUCOSE 4 TIMES DAILY   carvedilol (COREG) 12.5 MG tablet Take 2 tablets (25 mg total) by mouth 2 (two) times daily.   Cyanocobalamin (VITAMIN B-12) 2500 MCG SUBL Place 2,500 mcg under the tongue daily.   diclofenac Sodium (VOLTAREN) 1 % GEL Apply 2 g topically 4 (four) times daily as needed (pain).   empagliflozin (JARDIANCE) 10 MG TABS tablet Take by mouth.   FLUoxetine (PROZAC) 20 MG capsule TAKE 1 CAPSULE BY MOUTH ONCE DAILY . APPOINTMENT REQUIRED FOR FUTURE REFILLS   FLUoxetine (PROZAC) 20 MG capsule Take by mouth.   furosemide (LASIX) 20 MG tablet Take 1 tablet (20 mg total) by mouth daily.   insulin glargine (LANTUS SOLOSTAR) 100 UNIT/ML Solostar Pen Inject 32 Units into the skin at bedtime.    insulin lispro (HUMALOG) 100 UNIT/ML KiwkPen Inject 2-10  Units into the skin 4 (four) times daily -  before meals and at bedtime. Per sliding scale   Insulin Pen Needle 31G X 8 MM MISC by Does not apply route.   Lancets (ONETOUCH DELICA PLUS [id number]) MISC USE TO CHECK GLUCOSE 4 TIMES DAILY   levothyroxine (EUTHYROX) 150 MCG tablet Take 1 tablet (150 mcg total) by mouth daily before breakfast.   OneTouch Delica Lancets 62X MISC Use 4 (four) times daily   ramipril (ALTACE) 10 MG capsule TAKE 1 CAPSULE(10 MG) BY MOUTH DAILY   Vitamin D, Ergocalciferol, (DRISDOL) 1.25 MG (50000 UNIT) CAPS capsule Take 1 capsule (50,000 Units total) by mouth once a week.   [DISCONTINUED] celecoxib (CELEBREX) 200 MG capsule Take 1 capsule (200 mg total) by mouth 2 (two) times daily.   [DISCONTINUED] enoxaparin (LOVENOX) 40 MG/0.4ML injection Inject 0.4 mLs (40 mg total) into the skin daily.   [DISCONTINUED] ondansetron (ZOFRAN) 4 MG tablet Take 1 tablet (4 mg total) by mouth every 6 (six) hours as needed for nausea.   [DISCONTINUED] oxyCODONE (OXY IR/ROXICODONE) 5 MG immediate release tablet Take 1-2 tablets (5-10 mg total) by mouth every 4 (four) hours as needed for moderate pain (pain score 4-6).   [DISCONTINUED] quinapril (ACCUPRIL) 10 MG tablet Take by mouth.   [DISCONTINUED] traMADol (ULTRAM) 50 MG tablet Take 1 tablet (50 mg total) by mouth every 6 (six) hours as needed for moderate pain.   No  facility-administered medications prior to visit.    Review of Systems  HENT:  Positive for congestion, ear pain, postnasal drip, rhinorrhea, sinus pressure and sore throat. Negative for sinus pain.   Respiratory:  Positive for cough. Negative for shortness of breath.   Cardiovascular:  Negative for chest pain and palpitations.  All other systems reviewed and are negative.  Except see HPI     Objective    BP (!) 139/59 (BP Location: Left Arm, Patient Position: Sitting, Cuff Size: Large)   Pulse 60   Temp 98.6 F (37 C) (Oral)   Resp 16   Wt 202 lb (91.6 kg)   SpO2  100%   BMI 33.61 kg/m    Physical Exam Vitals reviewed.  Constitutional:      General: She is not in acute distress.    Appearance: Normal appearance. She is well-developed. She is not diaphoretic.  HENT:     Head: Normocephalic and atraumatic.     Nose: Congestion present.     Mouth/Throat:     Pharynx: No oropharyngeal exudate or posterior oropharyngeal erythema.  Eyes:     General: No scleral icterus.    Extraocular Movements: Extraocular movements intact.     Conjunctiva/sclera: Conjunctivae normal.     Pupils: Pupils are equal, round, and reactive to light.  Neck:     Thyroid: No thyromegaly.  Cardiovascular:     Rate and Rhythm: Normal rate and regular rhythm.     Pulses: Normal pulses.     Heart sounds: Normal heart sounds. No murmur heard. Pulmonary:     Effort: Pulmonary effort is normal. No respiratory distress.     Breath sounds: Normal breath sounds. No wheezing, rhonchi or rales.  Musculoskeletal:     Cervical back: Neck supple.     Right lower leg: No edema.     Left lower leg: No edema.  Lymphadenopathy:     Cervical: No cervical adenopathy.  Skin:    General: Skin is warm and dry.     Findings: No rash.  Neurological:     Mental Status: She is alert and oriented to person, place, and time. Mental status is at baseline.  Psychiatric:        Mood and Affect: Mood normal.        Behavior: Behavior normal.      No results found for any visits on 05/12/22.  Assessment & Plan     1. Acute cough Normal lung exam Continue symptomatic treatment. Drink a lot water. Air humidifier. Tylenol. - cetirizine (ZYRTEC) 10 MG tablet; Take 1 tablet (10 mg total) by mouth daily.  Dispense: 30 tablet; Refill: 11 - fluticasone (FLONASE) 50 MCG/ACT nasal spray; Place 2 sprays into both nostrils daily.  Dispense: 16 g; Refill: 6  2. Acute sinusitis, recurrence not specified, unspecified location Could be due to allergy, virus Pt was in contact with her grandson who  was diagnosed with sinusitis? And took abx  Normal lung exam.  - cetirizine (ZYRTEC) 10 MG tablet; Take 1 tablet (10 mg total) by mouth daily.  Dispense: 30 tablet; Refill: 11 - fluticasone (FLONASE) 50 MCG/ACT nasal spray; Place 2 sprays into both nostrils daily.  Dispense: 16 g; Refill: 6   FU PRN     The patient was advised to call back or seek an in-person evaluation if the symptoms worsen or if the condition fails to improve as anticipated.  I discussed the assessment and treatment plan with the patient. The patient was provided  an opportunity to ask questions and all were answered. The patient agreed with the plan and demonstrated an understanding of the instructions.  The entirety of the information documented in the History of Present Illness, Review of Systems and Physical Exam were personally obtained by me. Portions of this information were initially documented by the CMA and reviewed by me for thoroughness and accuracy.  Portions of this note were created using dictation software and may contain typographical errors.    Mardene Speak, PA-C  [facility] [phone] (phone) [phone] (fax)  [place]

## 2022-05-20 DIAGNOSIS — E1065 Type 1 diabetes mellitus with hyperglycemia: Secondary | ICD-10-CM | POA: Diagnosis not present

## 2022-06-20 DIAGNOSIS — E1065 Type 1 diabetes mellitus with hyperglycemia: Secondary | ICD-10-CM | POA: Diagnosis not present

## 2022-07-15 ENCOUNTER — Encounter: Payer: Self-pay | Admitting: Family Medicine

## 2022-07-19 ENCOUNTER — Encounter: Payer: Medicare Other | Admitting: Family Medicine

## 2022-07-20 DIAGNOSIS — E1065 Type 1 diabetes mellitus with hyperglycemia: Secondary | ICD-10-CM | POA: Diagnosis not present

## 2022-07-26 ENCOUNTER — Encounter: Payer: Medicare Other | Admitting: Family Medicine

## 2022-07-26 DIAGNOSIS — E039 Hypothyroidism, unspecified: Secondary | ICD-10-CM

## 2022-07-26 DIAGNOSIS — I10 Essential (primary) hypertension: Secondary | ICD-10-CM

## 2022-07-26 DIAGNOSIS — Z0000 Encounter for general adult medical examination without abnormal findings: Secondary | ICD-10-CM

## 2022-07-26 DIAGNOSIS — E7849 Other hyperlipidemia: Secondary | ICD-10-CM

## 2022-07-26 DIAGNOSIS — E1065 Type 1 diabetes mellitus with hyperglycemia: Secondary | ICD-10-CM

## 2022-08-02 ENCOUNTER — Telehealth: Payer: Self-pay | Admitting: Family Medicine

## 2022-08-02 DIAGNOSIS — E7849 Other hyperlipidemia: Secondary | ICD-10-CM

## 2022-08-02 NOTE — Telephone Encounter (Signed)
LOV 01/12/21 (05/12/22 acute visit) NOV 01/18/23 LRF 09/10/21 #90 3RF

## 2022-08-02 NOTE — Telephone Encounter (Signed)
[facility] pharmacy faxed refill request for the following medications:   atorvastatin (LIPITOR) 10 MG tablet     Please advise  

## 2022-08-18 ENCOUNTER — Other Ambulatory Visit: Payer: Self-pay | Admitting: Family Medicine

## 2022-08-18 DIAGNOSIS — E7849 Other hyperlipidemia: Secondary | ICD-10-CM

## 2022-08-18 NOTE — Telephone Encounter (Signed)
Medication Refill - Medication: atorvastatin (LIPITOR) 10 MG tablet   Has the patient contacted their pharmacy? Yes.   Pt told to contact provider for new Rx  Preferred Pharmacy (with phone number or street name):  [facility] Phone: [phone]  Fax: [phone]     Has the patient been seen for an appointment in the last year OR does the patient have an upcoming appointment? Yes.    Agent: Please be advised that RX refills may take up to 3 business days. We ask that you follow-up with your pharmacy.

## 2022-08-19 DIAGNOSIS — E1065 Type 1 diabetes mellitus with hyperglycemia: Secondary | ICD-10-CM | POA: Diagnosis not present

## 2022-08-19 NOTE — Telephone Encounter (Signed)
Requested medication (s) are due for refill today: yes  Requested medication (s) are on the active medication list: yes  Last refill:  09/10/21 #90 3 RF  Future visit scheduled: no  Notes to clinic:  overdue lab work   Requested Prescriptions  Pending Prescriptions Disp Refills   atorvastatin (LIPITOR) 10 MG tablet 90 tablet 3    Sig: Take 1 tablet (10 mg total) by mouth daily.     Cardiovascular:  Antilipid - Statins Failed - 08/18/2022  3:48 PM      Failed - Lipid Panel in normal range within the last 12 months    Cholesterol, Total  Date Value Ref Range Status  01/12/2021 182 100 - 199 mg/dL Final   LDL Chol Calc (NIH)  Date Value Ref Range Status  01/12/2021 89 0 - 99 mg/dL Final   HDL  Date Value Ref Range Status  01/12/2021 79 >39 mg/dL Final   Triglycerides  Date Value Ref Range Status  01/12/2021 76 0 - 149 mg/dL Final         Passed - Patient is not pregnant      Passed - Valid encounter within last 12 months    Recent Outpatient Visits           3 months ago Acute cough   [place], [place], PA-C   1 year ago Encounter for [employer] annual wellness exam   [facility] Jerrol Banana., MD   1 year ago Uncontrolled type 1 diabetes mellitus with hyperglycemia [facility])   [place] Jerrol Banana., MD   2 years ago Essential hypertension   [place] Jerrol Banana., MD   2 years ago Annual physical exam   [facility] Jerrol Banana., MD

## 2022-08-23 DIAGNOSIS — E103313 Type 1 diabetes mellitus with moderate nonproliferative diabetic retinopathy with macular edema, bilateral: Secondary | ICD-10-CM | POA: Diagnosis not present

## 2022-08-23 DIAGNOSIS — E039 Hypothyroidism, unspecified: Secondary | ICD-10-CM | POA: Diagnosis not present

## 2022-08-25 DIAGNOSIS — E1022 Type 1 diabetes mellitus with diabetic chronic kidney disease: Secondary | ICD-10-CM | POA: Diagnosis not present

## 2022-08-25 DIAGNOSIS — E039 Hypothyroidism, unspecified: Secondary | ICD-10-CM | POA: Diagnosis not present

## 2022-08-25 DIAGNOSIS — E1042 Type 1 diabetes mellitus with diabetic polyneuropathy: Secondary | ICD-10-CM | POA: Diagnosis not present

## 2022-08-25 DIAGNOSIS — E103313 Type 1 diabetes mellitus with moderate nonproliferative diabetic retinopathy with macular edema, bilateral: Secondary | ICD-10-CM | POA: Diagnosis not present

## 2022-09-01 ENCOUNTER — Other Ambulatory Visit: Payer: Self-pay | Admitting: Family Medicine

## 2022-09-01 DIAGNOSIS — M7989 Other specified soft tissue disorders: Secondary | ICD-10-CM

## 2022-09-01 MED ORDER — FLUOXETINE HCL 20 MG PO CAPS: 20.0000 mg | ORAL_CAPSULE | Freq: Every day | ORAL | 0 refills | Status: AC

## 2022-09-01 MED ORDER — CARVEDILOL 12.5 MG PO TABS: 25.0000 mg | ORAL_TABLET | Freq: Two times a day (BID) | ORAL | 0 refills | Status: AC

## 2022-09-01 MED ORDER — FUROSEMIDE 20 MG PO TABS: 20.0000 mg | ORAL_TABLET | Freq: Every day | ORAL | 0 refills | Status: AC

## 2022-09-01 NOTE — Telephone Encounter (Signed)
[place] faxed refill request for the following medications:   furosemide (LASIX) 20 MG tablet   FLUoxetine (PROZAC) 20 MG capsule    carvedilol (COREG) 12.5 MG tablet   Please advise.

## 2022-09-01 NOTE — Telephone Encounter (Signed)
Scheduled to see Dr. Rosanna Randy at [facility] 09/30/2022.  Prescription pended for qty of 9.

## 2022-09-02 DIAGNOSIS — E103592 Type 1 diabetes mellitus with proliferative diabetic retinopathy without macular edema, left eye: Secondary | ICD-10-CM | POA: Diagnosis not present

## 2022-09-02 DIAGNOSIS — H18523 Epithelial (juvenile) corneal dystrophy, bilateral: Secondary | ICD-10-CM | POA: Diagnosis not present

## 2022-09-02 DIAGNOSIS — E103511 Type 1 diabetes mellitus with proliferative diabetic retinopathy with macular edema, right eye: Secondary | ICD-10-CM | POA: Diagnosis not present

## 2022-09-02 DIAGNOSIS — H35372 Puckering of macula, left eye: Secondary | ICD-10-CM | POA: Diagnosis not present

## 2022-09-02 LAB — HM DIABETES EYE EXAM

## 2022-09-19 DIAGNOSIS — E1065 Type 1 diabetes mellitus with hyperglycemia: Secondary | ICD-10-CM | POA: Diagnosis not present

## 2022-09-30 DIAGNOSIS — F325 Major depressive disorder, single episode, in full remission: Secondary | ICD-10-CM | POA: Diagnosis not present

## 2022-09-30 DIAGNOSIS — I129 Hypertensive chronic kidney disease with stage 1 through stage 4 chronic kidney disease, or unspecified chronic kidney disease: Secondary | ICD-10-CM | POA: Diagnosis not present

## 2022-09-30 DIAGNOSIS — E785 Hyperlipidemia, unspecified: Secondary | ICD-10-CM | POA: Diagnosis not present

## 2022-10-01 ENCOUNTER — Other Ambulatory Visit: Payer: Self-pay | Admitting: Family Medicine

## 2022-10-01 DIAGNOSIS — M7989 Other specified soft tissue disorders: Secondary | ICD-10-CM

## 2022-10-06 DIAGNOSIS — I10 Essential (primary) hypertension: Secondary | ICD-10-CM | POA: Diagnosis not present

## 2022-10-06 DIAGNOSIS — E1122 Type 2 diabetes mellitus with diabetic chronic kidney disease: Secondary | ICD-10-CM | POA: Diagnosis not present

## 2022-10-06 DIAGNOSIS — N2581 Secondary hyperparathyroidism of renal origin: Secondary | ICD-10-CM | POA: Diagnosis not present

## 2022-10-06 DIAGNOSIS — N1832 Chronic kidney disease, stage 3b: Secondary | ICD-10-CM | POA: Diagnosis not present

## 2022-10-10 DIAGNOSIS — E1029 Type 1 diabetes mellitus with other diabetic kidney complication: Secondary | ICD-10-CM | POA: Diagnosis not present

## 2022-10-10 DIAGNOSIS — N2581 Secondary hyperparathyroidism of renal origin: Secondary | ICD-10-CM | POA: Diagnosis not present

## 2022-10-10 DIAGNOSIS — I10 Essential (primary) hypertension: Secondary | ICD-10-CM | POA: Diagnosis not present

## 2022-10-10 DIAGNOSIS — N1832 Chronic kidney disease, stage 3b: Secondary | ICD-10-CM | POA: Diagnosis not present

## 2022-10-19 DIAGNOSIS — E1065 Type 1 diabetes mellitus with hyperglycemia: Secondary | ICD-10-CM | POA: Diagnosis not present

## 2022-10-24 DIAGNOSIS — H18523 Epithelial (juvenile) corneal dystrophy, bilateral: Secondary | ICD-10-CM | POA: Diagnosis not present

## 2022-10-24 DIAGNOSIS — E103511 Type 1 diabetes mellitus with proliferative diabetic retinopathy with macular edema, right eye: Secondary | ICD-10-CM | POA: Diagnosis not present

## 2022-10-24 DIAGNOSIS — H35372 Puckering of macula, left eye: Secondary | ICD-10-CM | POA: Diagnosis not present

## 2022-10-24 DIAGNOSIS — E103592 Type 1 diabetes mellitus with proliferative diabetic retinopathy without macular edema, left eye: Secondary | ICD-10-CM | POA: Diagnosis not present

## 2022-11-18 DIAGNOSIS — E1065 Type 1 diabetes mellitus with hyperglycemia: Secondary | ICD-10-CM | POA: Diagnosis not present

## 2022-11-23 DIAGNOSIS — Z79899 Other long term (current) drug therapy: Secondary | ICD-10-CM | POA: Diagnosis not present

## 2022-11-23 DIAGNOSIS — E039 Hypothyroidism, unspecified: Secondary | ICD-10-CM | POA: Diagnosis not present

## 2022-11-23 DIAGNOSIS — R519 Headache, unspecified: Secondary | ICD-10-CM | POA: Diagnosis not present

## 2022-12-20 DIAGNOSIS — E1065 Type 1 diabetes mellitus with hyperglycemia: Secondary | ICD-10-CM | POA: Diagnosis not present

## 2022-12-20 DIAGNOSIS — E103511 Type 1 diabetes mellitus with proliferative diabetic retinopathy with macular edema, right eye: Secondary | ICD-10-CM | POA: Diagnosis not present

## 2022-12-27 DIAGNOSIS — E103313 Type 1 diabetes mellitus with moderate nonproliferative diabetic retinopathy with macular edema, bilateral: Secondary | ICD-10-CM | POA: Diagnosis not present

## 2022-12-27 DIAGNOSIS — E039 Hypothyroidism, unspecified: Secondary | ICD-10-CM | POA: Diagnosis not present

## 2022-12-28 ENCOUNTER — Other Ambulatory Visit: Payer: Self-pay | Admitting: Neurology

## 2022-12-28 DIAGNOSIS — R519 Headache, unspecified: Secondary | ICD-10-CM

## 2023-01-03 DIAGNOSIS — E039 Hypothyroidism, unspecified: Secondary | ICD-10-CM | POA: Diagnosis not present

## 2023-01-03 DIAGNOSIS — E1022 Type 1 diabetes mellitus with diabetic chronic kidney disease: Secondary | ICD-10-CM | POA: Diagnosis not present

## 2023-01-03 DIAGNOSIS — E103313 Type 1 diabetes mellitus with moderate nonproliferative diabetic retinopathy with macular edema, bilateral: Secondary | ICD-10-CM | POA: Diagnosis not present

## 2023-01-03 DIAGNOSIS — E1042 Type 1 diabetes mellitus with diabetic polyneuropathy: Secondary | ICD-10-CM | POA: Diagnosis not present

## 2023-01-11 ENCOUNTER — Encounter: Payer: Self-pay | Admitting: Neurology

## 2023-01-13 ENCOUNTER — Ambulatory Visit
Admission: RE | Admit: 2023-01-13 | Discharge: 2023-01-13 | Disposition: A | Discharge status: Home / Self Care | Payer: Medicare Other | Source: Ambulatory Visit | Attending: Neurology | Admitting: Neurology

## 2023-01-13 DIAGNOSIS — R519 Headache, unspecified: Secondary | ICD-10-CM

## 2023-01-19 DIAGNOSIS — E1065 Type 1 diabetes mellitus with hyperglycemia: Secondary | ICD-10-CM | POA: Diagnosis not present

## 2023-01-24 DIAGNOSIS — Z0000 Encounter for general adult medical examination without abnormal findings: Secondary | ICD-10-CM | POA: Diagnosis not present

## 2023-01-24 DIAGNOSIS — Z1231 Encounter for screening mammogram for malignant neoplasm of breast: Secondary | ICD-10-CM | POA: Diagnosis not present

## 2023-01-24 DIAGNOSIS — E1042 Type 1 diabetes mellitus with diabetic polyneuropathy: Secondary | ICD-10-CM | POA: Diagnosis not present

## 2023-01-25 ENCOUNTER — Other Ambulatory Visit: Payer: Self-pay | Admitting: Family Medicine

## 2023-01-25 DIAGNOSIS — Z1231 Encounter for screening mammogram for malignant neoplasm of breast: Secondary | ICD-10-CM

## 2023-01-30 DIAGNOSIS — E103511 Type 1 diabetes mellitus with proliferative diabetic retinopathy with macular edema, right eye: Secondary | ICD-10-CM | POA: Diagnosis not present

## 2023-01-30 DIAGNOSIS — H3341 Traction detachment of retina, right eye: Secondary | ICD-10-CM | POA: Diagnosis not present

## 2023-02-16 ENCOUNTER — Ambulatory Visit
Admission: RE | Admit: 2023-02-16 | Discharge: 2023-02-16 | Disposition: A | Discharge status: Home / Self Care | Payer: Medicare Other | Source: Ambulatory Visit | Attending: Family Medicine | Admitting: Family Medicine

## 2023-02-16 DIAGNOSIS — Z1231 Encounter for screening mammogram for malignant neoplasm of breast: Secondary | ICD-10-CM | POA: Diagnosis not present

## 2023-02-18 DIAGNOSIS — E1065 Type 1 diabetes mellitus with hyperglycemia: Secondary | ICD-10-CM | POA: Diagnosis not present

## 2023-02-23 DIAGNOSIS — R2689 Other abnormalities of gait and mobility: Secondary | ICD-10-CM | POA: Diagnosis not present

## 2023-02-23 DIAGNOSIS — R768 Other specified abnormal immunological findings in serum: Secondary | ICD-10-CM | POA: Diagnosis not present

## 2023-02-23 DIAGNOSIS — Z8639 Personal history of other endocrine, nutritional and metabolic disease: Secondary | ICD-10-CM | POA: Diagnosis not present

## 2023-02-23 DIAGNOSIS — R269 Unspecified abnormalities of gait and mobility: Secondary | ICD-10-CM | POA: Diagnosis not present

## 2023-03-06 DIAGNOSIS — H3341 Traction detachment of retina, right eye: Secondary | ICD-10-CM | POA: Diagnosis not present

## 2023-03-06 DIAGNOSIS — E103511 Type 1 diabetes mellitus with proliferative diabetic retinopathy with macular edema, right eye: Secondary | ICD-10-CM | POA: Diagnosis not present

## 2023-03-20 DIAGNOSIS — E1065 Type 1 diabetes mellitus with hyperglycemia: Secondary | ICD-10-CM | POA: Diagnosis not present

## 2023-04-04 DIAGNOSIS — E103313 Type 1 diabetes mellitus with moderate nonproliferative diabetic retinopathy with macular edema, bilateral: Secondary | ICD-10-CM | POA: Diagnosis not present

## 2023-04-04 DIAGNOSIS — E039 Hypothyroidism, unspecified: Secondary | ICD-10-CM | POA: Diagnosis not present

## 2023-04-17 DIAGNOSIS — H3341 Traction detachment of retina, right eye: Secondary | ICD-10-CM | POA: Diagnosis not present

## 2023-04-20 DIAGNOSIS — N2581 Secondary hyperparathyroidism of renal origin: Secondary | ICD-10-CM | POA: Diagnosis not present

## 2023-04-20 DIAGNOSIS — E1029 Type 1 diabetes mellitus with other diabetic kidney complication: Secondary | ICD-10-CM | POA: Diagnosis not present

## 2023-04-20 DIAGNOSIS — N1832 Chronic kidney disease, stage 3b: Secondary | ICD-10-CM | POA: Diagnosis not present

## 2023-04-25 DIAGNOSIS — I129 Hypertensive chronic kidney disease with stage 1 through stage 4 chronic kidney disease, or unspecified chronic kidney disease: Secondary | ICD-10-CM | POA: Diagnosis not present

## 2023-04-25 DIAGNOSIS — N1832 Chronic kidney disease, stage 3b: Secondary | ICD-10-CM | POA: Diagnosis not present

## 2023-04-25 DIAGNOSIS — R809 Proteinuria, unspecified: Secondary | ICD-10-CM | POA: Diagnosis not present

## 2023-04-25 DIAGNOSIS — E875 Hyperkalemia: Secondary | ICD-10-CM | POA: Diagnosis not present

## 2023-04-26 DIAGNOSIS — F325 Major depressive disorder, single episode, in full remission: Secondary | ICD-10-CM | POA: Diagnosis not present

## 2023-04-26 DIAGNOSIS — Z23 Encounter for immunization: Secondary | ICD-10-CM | POA: Diagnosis not present

## 2023-04-26 DIAGNOSIS — E1042 Type 1 diabetes mellitus with diabetic polyneuropathy: Secondary | ICD-10-CM | POA: Diagnosis not present

## 2023-04-26 DIAGNOSIS — G35 Multiple sclerosis: Secondary | ICD-10-CM | POA: Diagnosis not present

## 2023-04-26 DIAGNOSIS — N1832 Chronic kidney disease, stage 3b: Secondary | ICD-10-CM | POA: Diagnosis not present

## 2023-05-01 DIAGNOSIS — E103313 Type 1 diabetes mellitus with moderate nonproliferative diabetic retinopathy with macular edema, bilateral: Secondary | ICD-10-CM | POA: Diagnosis not present

## 2023-05-01 DIAGNOSIS — E103511 Type 1 diabetes mellitus with proliferative diabetic retinopathy with macular edema, right eye: Secondary | ICD-10-CM | POA: Diagnosis not present

## 2023-05-02 DIAGNOSIS — Z96653 Presence of artificial knee joint, bilateral: Secondary | ICD-10-CM | POA: Diagnosis not present

## 2023-05-11 DIAGNOSIS — E1042 Type 1 diabetes mellitus with diabetic polyneuropathy: Secondary | ICD-10-CM | POA: Diagnosis not present

## 2023-05-11 DIAGNOSIS — E103313 Type 1 diabetes mellitus with moderate nonproliferative diabetic retinopathy with macular edema, bilateral: Secondary | ICD-10-CM | POA: Diagnosis not present

## 2023-05-11 DIAGNOSIS — E1022 Type 1 diabetes mellitus with diabetic chronic kidney disease: Secondary | ICD-10-CM | POA: Diagnosis not present

## 2023-05-11 DIAGNOSIS — E039 Hypothyroidism, unspecified: Secondary | ICD-10-CM | POA: Diagnosis not present

## 2023-05-17 DIAGNOSIS — H04123 Dry eye syndrome of bilateral lacrimal glands: Secondary | ICD-10-CM | POA: Diagnosis not present

## 2023-05-17 DIAGNOSIS — R768 Other specified abnormal immunological findings in serum: Secondary | ICD-10-CM | POA: Diagnosis not present

## 2023-05-17 DIAGNOSIS — R899 Unspecified abnormal finding in specimens from other organs, systems and tissues: Secondary | ICD-10-CM | POA: Diagnosis not present

## 2023-05-23 ENCOUNTER — Telehealth: Payer: Self-pay | Admitting: Pharmacist

## 2023-05-23 NOTE — Progress Notes (Signed)
05/23/2023  Patient ID: Mary [name], female   DOB: 1946/06/12, 77 y.o.   MRN: [id number] Pharmacy Quality Measure Review  This patient is appearing on a report for being at risk of failing the adherence measure for cholesterol (statin) medications this calendar year.   Medication: Atorvastatin 10mg  Last fill date: 05/10/23 for 90 day supply; 12/23/22 prior (1.5 months missed)  Insurance report was not up to date. No action needed at this time.    Marlowe Aschoff, PharmD [facility] Phone Number: [phone]

## 2023-05-31 DIAGNOSIS — E103313 Type 1 diabetes mellitus with moderate nonproliferative diabetic retinopathy with macular edema, bilateral: Secondary | ICD-10-CM | POA: Diagnosis not present

## 2023-06-30 DIAGNOSIS — E103313 Type 1 diabetes mellitus with moderate nonproliferative diabetic retinopathy with macular edema, bilateral: Secondary | ICD-10-CM | POA: Diagnosis not present

## 2023-06-30 DIAGNOSIS — E103511 Type 1 diabetes mellitus with proliferative diabetic retinopathy with macular edema, right eye: Secondary | ICD-10-CM | POA: Diagnosis not present

## 2023-07-03 DIAGNOSIS — E103511 Type 1 diabetes mellitus with proliferative diabetic retinopathy with macular edema, right eye: Secondary | ICD-10-CM | POA: Diagnosis not present

## 2023-07-03 DIAGNOSIS — H3341 Traction detachment of retina, right eye: Secondary | ICD-10-CM | POA: Diagnosis not present

## 2023-07-06 ENCOUNTER — Other Ambulatory Visit: Payer: Self-pay | Admitting: Physician Assistant

## 2023-07-06 DIAGNOSIS — R051 Acute cough: Secondary | ICD-10-CM

## 2023-07-06 DIAGNOSIS — J019 Acute sinusitis, unspecified: Secondary | ICD-10-CM

## 2023-07-06 NOTE — Telephone Encounter (Signed)
Pt no longer under care of prescriber- pt sees Dr Sullivan Lone now  Requested Prescriptions  Refused Prescriptions Disp Refills   cetirizine (ZYRTEC) 10 MG tablet [Pharmacy Med Name: CETIRIZINE 10MG  TABLETS] 30 tablet 11    Sig: TAKE 1 TABLET(10 MG) BY MOUTH DAILY     Ear, Nose, and Throat:  Antihistamines 2 Failed - 07/06/2023 10:30 AM      Failed - Cr in normal range and within 360 days    Creatinine  Date Value Ref Range Status  07/11/2012 1.19 0.60 - 1.30 mg/dL Final   Creatinine, Ser  Date Value Ref Range Status  01/15/2021 1.26 (H) 0.44 - 1.00 mg/dL Final         Failed - Valid encounter within last 12 months    Recent Outpatient Visits           1 year ago Acute cough   [place] [facility] [place], [place], PA-C   2 years ago Encounter for [employer] annual wellness exam   [facility] Maple [place]., MD   2 years ago Uncontrolled type 1 diabetes mellitus with hyperglycemia [facility])   [place] [facility] Maple Hudson., MD   3 years ago Essential hypertension   [place] [facility] Maple Hudson., MD   3 years ago Annual physical exam   [facility] Maple Hudson., MD

## 2023-12-21 ENCOUNTER — Emergency Department
Admission: EM | Admit: 2023-12-21 | Discharge: 2023-12-21 | Disposition: A | Discharge status: Home / Self Care | Attending: Emergency Medicine | Admitting: Emergency Medicine

## 2023-12-21 ENCOUNTER — Other Ambulatory Visit: Payer: Self-pay

## 2023-12-21 ENCOUNTER — Encounter: Payer: Self-pay | Admitting: Emergency Medicine

## 2023-12-21 DIAGNOSIS — E11649 Type 2 diabetes mellitus with hypoglycemia without coma: Secondary | ICD-10-CM | POA: Insufficient documentation

## 2023-12-21 DIAGNOSIS — E039 Hypothyroidism, unspecified: Secondary | ICD-10-CM | POA: Diagnosis not present

## 2023-12-21 DIAGNOSIS — I129 Hypertensive chronic kidney disease with stage 1 through stage 4 chronic kidney disease, or unspecified chronic kidney disease: Secondary | ICD-10-CM | POA: Diagnosis not present

## 2023-12-21 DIAGNOSIS — E1122 Type 2 diabetes mellitus with diabetic chronic kidney disease: Secondary | ICD-10-CM | POA: Insufficient documentation

## 2023-12-21 DIAGNOSIS — N189 Chronic kidney disease, unspecified: Secondary | ICD-10-CM | POA: Diagnosis not present

## 2023-12-21 DIAGNOSIS — E162 Hypoglycemia, unspecified: Secondary | ICD-10-CM

## 2023-12-21 LAB — BASIC METABOLIC PANEL WITH GFR
Anion gap: 8 (ref 5–15)
BUN: 49 mg/dL — ABNORMAL HIGH (ref 8–23)
CO2: 26 mmol/L (ref 22–32)
Calcium: 9.5 mg/dL (ref 8.9–10.3)
Chloride: 103 mmol/L (ref 98–111)
Creatinine, Ser: 1.29 mg/dL — ABNORMAL HIGH (ref 0.44–1.00)
GFR, Estimated: 43 mL/min — ABNORMAL LOW (ref >60)
Glucose, Bld: 227 mg/dL — ABNORMAL HIGH (ref 70–99)
Potassium: 3.4 mmol/L — ABNORMAL LOW (ref 3.5–5.1)
Sodium: 137 mmol/L (ref 135–145)

## 2023-12-21 LAB — CBC
HCT: 39.6 % (ref 36.0–46.0)
Hemoglobin: 12.9 g/dL (ref 12.0–15.0)
MCH: 30.3 pg (ref 26.0–34.0)
MCHC: 32.6 g/dL (ref 30.0–36.0)
MCV: 93 fL (ref 80.0–100.0)
Platelets: 204 10*3/uL (ref 150–400)
RBC: 4.26 MIL/uL (ref 3.87–5.11)
RDW: 13.5 % (ref 11.5–15.5)
WBC: 10.4 10*3/uL (ref 4.0–10.5)
nRBC: 0 % (ref 0.0–0.2)

## 2023-12-21 LAB — CBG MONITORING, ED
Glucose-Capillary: 180 mg/dL — ABNORMAL HIGH (ref 70–99)
Glucose-Capillary: 140 mg/dL — ABNORMAL HIGH (ref 70–99)

## 2023-12-21 NOTE — ED Notes (Signed)
Pt alert and eating.

## 2023-12-21 NOTE — ED Provider Notes (Signed)
 [facility] Provider Note   Event Date/Time   First MD Initiated Contact with Patient 12/21/23 1304     (approximate) History  Hypoglycemia  HPI BLANCHE SCOVELL is a 78 y.o. female with a past medical history of diabetes, hypothyroidism, CKD, hypertension, and hyperlipidemia who presents complaining of hypoglycemia.  EMS was called out after daughter found patient at home unresponsive with glucose of 44.  EMS gave 25 g of D10 and sugar increased to 255 however then dropped again to 40 and EMS was able to give another 45 g of D10.  On arrival, patient is alert, oriented, and in no acute distress.  Patient states the last thing she remembers was going to sleep last night and EMS arriving at her door today.  According to patient's daughter at bedside, she was planning to come over to the house to do insulin  pump training and is concerned that patient may have taken her insulin  but did not eat anything afterwards. ROS: Patient currently denies any vision changes, tinnitus, difficulty speaking, facial droop, sore throat, chest pain, shortness of breath, abdominal pain, nausea/vomiting/diarrhea, dysuria, or weakness/numbness/paresthesias in any extremity   Physical Exam  Triage Vital Signs: ED Triage Vitals  Encounter Vitals Group     BP 12/21/23 1300 (!) 190/61     Systolic BP Percentile --      Diastolic BP Percentile --      Pulse Rate 12/21/23 1300 (!) 56     Resp 12/21/23 1300 11     Temp --      Temp src --      SpO2 12/21/23 1300 99 %     Weight 12/21/23 1301 207 lb (93.9 kg)     Height 12/21/23 1301 5\' 4"  (1.626 m)     Head Circumference --      Peak Flow --      Pain Score 12/21/23 1301 0     Pain Loc --      Pain Education --      Exclude from Growth Chart --    Most recent vital signs: Vitals:   12/21/23 1300  BP: (!) 190/61  Pulse: (!) 56  Resp: 11  SpO2: 99%   General: Awake, oriented x4. CV:  Good peripheral perfusion. Resp:  Normal  effort. Abd:  No distention. Other:  Elderly obese Caucasian female resting comfortably in no acute distress ED Results / Procedures / Treatments  Labs (all labs ordered are listed, but only abnormal results are displayed) Labs Reviewed  BASIC METABOLIC PANEL WITH GFR - Abnormal; Notable for the following components:      Result Value   Potassium 3.4 (*)    Glucose, Bld 227 (*)    BUN 49 (*)    Creatinine, Ser 1.29 (*)    GFR, Estimated 43 (*)    All other components within normal limits  CBG MONITORING, ED - Abnormal; Notable for the following components:   Glucose-Capillary 180 (*)    All other components within normal limits  CBG MONITORING, ED - Abnormal; Notable for the following components:   Glucose-Capillary 140 (*)    All other components within normal limits  CBC  CBG MONITORING, ED  CBG MONITORING, ED  CBG MONITORING, ED  CBG MONITORING, ED  CBG MONITORING, ED   EKG ED ECG REPORT I, Charleen Conn, the attending physician, personally viewed and interpreted this ECG. Date: 12/21/2023 EKG Time: 1300 Rate: 57  Rhythm: normal sinus rhythm QRS Axis:  normal Intervals: normal ST/T Wave abnormalities: normal Narrative Interpretation: no evidence of acute ischemia PROCEDURES: Critical Care performed: No Procedures MEDICATIONS ORDERED IN ED: Medications - No data to display IMPRESSION / MDM / ASSESSMENT AND PLAN / ED COURSE  I reviewed the triage vital signs and the nursing notes.                             The patient is on the cardiac monitor to evaluate for evidence of arrhythmia and/or significant heart rate changes. Patient's presentation is most consistent with acute presentation with potential threat to life or bodily function. Type II diabetic diabetic presents BIBA for altered mental status and hypoglycemia  Given History, Exam, and Workup I have a low suspicion for sepsis induced hypoglycemia, long lasting medication overdose, suicidal  ideation/purposeful overdose attempt.  Reassessment: After multiple blood glucose check, patient has maintained adequate normoglycemia After observation in the emergency department for several hours, the Blood sugar has remained stable, and based on the history is expected that the Blood sugar should remain stable after discharge. Disposition: Plan discharge home with close primary care follow-up   FINAL CLINICAL IMPRESSION(S) / ED DIAGNOSES   Final diagnoses:  Hypoglycemia   Rx / DC Orders   ED Discharge Orders     None      Note:  This document was prepared using Dragon voice recognition software and may include unintentional dictation errors.   Charleen Conn, MD 12/21/23 1452

## 2023-12-21 NOTE — ED Triage Notes (Signed)
 Pt via [facility] from home. Daughter called out because pt wasn't answering her found. On EMS arrival, pt was very lethargic and unable to arouse. EMS report initial CBG of 44. EMS gave 25g of D10 and sugar increased to 255, but then dropped again to 40 and EMS gave another 45g. On arrival, pt is alert but slow to respond and CBG is 180. Pt is A&Ox4 and NAD
